# Patient Record
Sex: Female | Born: 1990 | Race: White | Hispanic: No | Marital: Married | Smoking: Never smoker
Health system: Southern US, Community
[De-identification: ages and names within clinical notes are randomized; demographics above are authoritative.]

## PROBLEM LIST (undated history)

## (undated) DIAGNOSIS — F329 Major depressive disorder, single episode, unspecified: Secondary | ICD-10-CM

## (undated) DIAGNOSIS — F32A Depression, unspecified: Secondary | ICD-10-CM

## (undated) DIAGNOSIS — C801 Malignant (primary) neoplasm, unspecified: Secondary | ICD-10-CM

## (undated) DIAGNOSIS — R221 Localized swelling, mass and lump, neck: Secondary | ICD-10-CM

## (undated) DIAGNOSIS — F419 Anxiety disorder, unspecified: Secondary | ICD-10-CM

## (undated) DIAGNOSIS — U071 COVID-19: Secondary | ICD-10-CM

## (undated) DIAGNOSIS — T7840XA Allergy, unspecified, initial encounter: Secondary | ICD-10-CM

## (undated) DIAGNOSIS — B369 Superficial mycosis, unspecified: Secondary | ICD-10-CM

## (undated) HISTORY — DX: COVID-19: U07.1

## (undated) HISTORY — DX: Allergy, unspecified, initial encounter: T78.40XA

## (undated) HISTORY — DX: Superficial mycosis, unspecified: B36.9

## (undated) HISTORY — PX: EYE SURGERY: SHX253

## (undated) HISTORY — PX: TOOTH EXTRACTION: SUR596

## (undated) HISTORY — PX: PORTA CATH INSERTION: CATH118285

## (undated) HISTORY — DX: Localized swelling, mass and lump, neck: R22.1

---

## 2013-07-26 ENCOUNTER — Other Ambulatory Visit: Payer: Self-pay | Admitting: Otolaryngology

## 2013-07-26 DIAGNOSIS — R590 Localized enlarged lymph nodes: Secondary | ICD-10-CM

## 2013-07-30 ENCOUNTER — Ambulatory Visit
Admission: RE | Admit: 2013-07-30 | Discharge: 2013-07-30 | Disposition: A | Discharge status: Home / Self Care | Payer: 59 | Source: Ambulatory Visit | Attending: Otolaryngology | Admitting: Otolaryngology

## 2013-07-30 DIAGNOSIS — R590 Localized enlarged lymph nodes: Secondary | ICD-10-CM

## 2013-07-30 MED ORDER — IOHEXOL 300 MG/ML  SOLN
75.0000 mL | Freq: Once | INTRAMUSCULAR | Status: AC | PRN
  Administered 2013-07-30: 75 mL via INTRAVENOUS

## 2013-08-04 ENCOUNTER — Encounter (HOSPITAL_COMMUNITY): Payer: Self-pay | Admitting: Pharmacist

## 2013-08-04 ENCOUNTER — Institutional Professional Consult (permissible substitution) (INDEPENDENT_AMBULATORY_CARE_PROVIDER_SITE_OTHER): Payer: 59 | Admitting: Thoracic Surgery (Cardiothoracic Vascular Surgery)

## 2013-08-04 ENCOUNTER — Other Ambulatory Visit: Payer: Self-pay | Admitting: *Deleted

## 2013-08-04 ENCOUNTER — Other Ambulatory Visit: Payer: Self-pay

## 2013-08-04 VITALS — BP 130/70 | HR 90 | Resp 20 | Ht 69.0 in | Wt 215.0 lb

## 2013-08-04 DIAGNOSIS — R590 Localized enlarged lymph nodes: Secondary | ICD-10-CM | POA: Insufficient documentation

## 2013-08-04 DIAGNOSIS — R599 Enlarged lymph nodes, unspecified: Secondary | ICD-10-CM

## 2013-08-04 NOTE — Progress Notes (Signed)
PCP is No primary provider on file. Referring Provider is Rosen, Jefry, MD  Chief Complaint  Patient presents with  . Adenopathy    Surgical eval, Chest CT 07/30/2013    HPI: 22 yo woman sent for consultation regarding a mediastinal mass  [name] is a 22 year old previously healthy woman who first noted an enlarged lymph node in her left axilla in June. She also was having occasional dizzy spells and some low grade fevers. She was treated with antibiotics. Her symptoms and axillary node continued to wax and wane. In October she noted the axillary node again as well as a "swollen gland" in her right neck. She was having fevers, chills and night sweats, and continue to have dizzy spells and headaches. Over the past couple of weeks she has had "difficulty breathing and swallowing." A CXR was done on 10/28 and showed an anterior mediastinal mass. This was followed by a CT of the chest on 11/7 which showed a large anterior mediastinal mass encasing the great vessels. She has lost about 15 pounds in the last 3 months. She is generally tired. She continues to work. Her ZUBROD score is 0.   Past Medical History  Diagnosis Date  . Mass in neck     right side     Past Surgical History  Procedure Laterality Date  . Tooth extraction      Family History  Problem Relation Age of Onset  . Alzheimer's disease    . Osteoarthritis Mother   . Heart disease    . Hearing loss    . Migraines Mother   . Parkinsonism    . Breast cancer    . Transient ischemic attack    . Autoimmune disease      Social History History  Substance Use Topics  . Smoking status: Never Smoker   . Smokeless tobacco: Never Used  . Alcohol Use: No    No current outpatient prescriptions on file.   No current facility-administered medications for this visit.    No Known Allergies  Review of Systems  Constitutional: Positive for fever, chills, diaphoresis (night sweats) and activity change.  Respiratory:   Difficulty breathing and swallowing over past couple of weeks  Genitourinary: Positive for frequency.  Skin:       Itching  Neurological: Positive for dizziness and headaches.  Psychiatric/Behavioral: The patient is nervous/anxious.   All other systems reviewed and are negative.    BP 130/70  Pulse 90  Resp 20  Ht 5' 9" (1.753 m)  Wt 215 lb (97.523 kg)  BMI 31.74 kg/m2  SpO2 99%  LMP 07/02/2013 Physical Exam  Vitals reviewed. Constitutional: She is oriented to person, place, and time. No distress.  Overweight  HENT:  Head: Normocephalic and atraumatic.  Eyes: EOM are normal. Pupils are equal, round, and reactive to light.  Neck: Neck supple. No tracheal deviation present. No thyromegaly present.  Cardiovascular: Normal rate and regular rhythm.  Exam reveals no gallop and no friction rub.   No murmur heard. Pulmonary/Chest: Effort normal and breath sounds normal. No stridor. She has no wheezes. She has no rales.  Abdominal: Soft. There is no tenderness.  Musculoskeletal: She exhibits no edema.  Lymphadenopathy:    She has cervical adenopathy (high right cervical adenopathy).  Neurological: She is alert and oriented to person, place, and time.  No focal deficit  Skin: Skin is warm and dry.     Diagnostic Tests:  CT CHEST WITH CONTRAST  TECHNIQUE:    Multidetector CT imaging of the chest was performed during  intravenous contrast administration.  CONTRAST: 75mL OMNIPAQUE IOHEXOL 300 MG/ML SOLN  COMPARISON: Chest radiographs dated 07/20/2013  FINDINGS:  Minimal/trace right pleural fluid. Minimal dependent atelectasis.  Lungs are otherwise clear. No pneumothorax.  Visualized thyroid is unremarkable.  Heart is normal in size. No pericardial effusion.  5.5 x 13.7 cm soft tissue mass in the anterior/superior mediastinum  (series 2/ image 20), suspicious for aggregate prevascular  lymphadenopathy.  Additional lymphadenopathy includes:  --1.6 cm short axis right  supraclavicular node (series 2/image 5)  --3.7 cm short axis right paratracheal node (series 2/ image 15)  --2.3 cm short axis left axillary node (series 2/ image 15)  --2.5 cm short axis left IMA node (series 2/ image 27)  Visualized upper abdomen is unremarkable. No visualized  lymphadenopathy.  Visualized osseous structures are within normal limits.  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  Additional thoracic lymphadenopathy, as described above.  Consider biopsy or surgical resection of left axillary nodes for  tissue confirmation. Additionally, PET-CT could be considered for  further staging.  Electronically Signed  By: Sriyesh Krishnan M.D.  On: 07/30/2013 10:10   Impression: 22 yo woman with an anterior mediastinal mass. Her symptoms of fever, night sweats, weight loss, etc are highly concerning for lymphoma.  I reviewed the CT with [name] and her father. We discussed the differential diagnosis including thymoma, germ cell tumors, thyroid and lymphoma. We discussed the need for a surgical biopsy to obtain adequate tissue for diagnosis. I recommended we proceed with a mediastinoscopy to obtain tissue from the mass. I described the operation to them including the incision and the need for general anesthesia. We discussed the indications, risks, benefits, and alternatives. They understand the risks include but are not limited to death(<1%), bleeding, infection, recurrent nerve injury causing hoarseness. They understand this is a diagnostic, and not a therapeutic, procedure. We will plan to do this as an outpatient. She accepts the risks and agrees to proceed.  Will have alpha fetoprotein and beta HCG done preop.  Plan mediastinoscopy in OR Monday 08/09/13 

## 2013-08-05 ENCOUNTER — Other Ambulatory Visit: Payer: Self-pay | Admitting: *Deleted

## 2013-08-05 ENCOUNTER — Other Ambulatory Visit: Payer: Self-pay

## 2013-08-05 DIAGNOSIS — R590 Localized enlarged lymph nodes: Secondary | ICD-10-CM

## 2013-08-05 DIAGNOSIS — R222 Localized swelling, mass and lump, trunk: Secondary | ICD-10-CM

## 2013-08-06 ENCOUNTER — Encounter (HOSPITAL_COMMUNITY): Payer: Self-pay

## 2013-08-06 ENCOUNTER — Encounter (HOSPITAL_COMMUNITY)
Admission: RE | Admit: 2013-08-06 | Discharge: 2013-08-06 | Disposition: A | Discharge status: Home / Self Care | Payer: 59 | Source: Ambulatory Visit | Attending: Thoracic Surgery (Cardiothoracic Vascular Surgery) | Admitting: Thoracic Surgery (Cardiothoracic Vascular Surgery)

## 2013-08-06 ENCOUNTER — Encounter: Payer: 59 | Admitting: Thoracic Surgery (Cardiothoracic Vascular Surgery)

## 2013-08-06 ENCOUNTER — Telehealth: Payer: Self-pay | Admitting: *Deleted

## 2013-08-06 ENCOUNTER — Other Ambulatory Visit: Payer: Self-pay | Admitting: *Deleted

## 2013-08-06 ENCOUNTER — Other Ambulatory Visit (HOSPITAL_COMMUNITY): Payer: Self-pay | Admitting: *Deleted

## 2013-08-06 VITALS — BP 131/79 | HR 104 | Temp 98.7°F | Resp 18 | Ht 69.0 in | Wt 218.0 lb

## 2013-08-06 DIAGNOSIS — R590 Localized enlarged lymph nodes: Secondary | ICD-10-CM

## 2013-08-06 HISTORY — DX: Major depressive disorder, single episode, unspecified: F32.9

## 2013-08-06 HISTORY — DX: Depression, unspecified: F32.A

## 2013-08-06 HISTORY — DX: Anxiety disorder, unspecified: F41.9

## 2013-08-06 LAB — SURGICAL PCR SCREEN: Staphylococcus aureus: POSITIVE — AB

## 2013-08-06 LAB — CBC
HCT: 35.8 % — ABNORMAL LOW (ref 36.0–46.0)
MCV: 73.8 fL — ABNORMAL LOW (ref 78.0–100.0)
RBC: 4.85 MIL/uL (ref 3.87–5.11)
RDW: 16.1 % — ABNORMAL HIGH (ref 11.5–15.5)
WBC: 9.5 10*3/uL (ref 4.0–10.5)

## 2013-08-06 LAB — COMPREHENSIVE METABOLIC PANEL
BUN: 9 mg/dL (ref 6–23)
CO2: 26 mEq/L (ref 19–32)
Chloride: 101 mEq/L (ref 96–112)
Creatinine, Ser: 0.78 mg/dL (ref 0.50–1.10)
GFR calc Af Amer: >90 mL/min (ref >90)
GFR calc non Af Amer: >90 mL/min (ref >90)
Glucose, Bld: 90 mg/dL (ref 70–99)
Potassium: 4.2 mEq/L (ref 3.5–5.1)
Total Bilirubin: 0.3 mg/dL (ref 0.3–1.2)

## 2013-08-06 LAB — PROTIME-INR
INR: 1.08 (ref 0.00–1.49)
Prothrombin Time: 13.8 seconds (ref 11.6–15.2)

## 2013-08-06 NOTE — Pre-Procedure Instructions (Signed)
Miranda Villegas  08/06/2013   Your procedure is scheduled on:  Monday, August 09, 2013 at 3:20 PM.   Report to [facility] Entrance "A" at 8:30 AM.   Call this number if you have problems the morning of surgery: [phone]   Remember:   Do not eat food or drink liquids after midnight Sunday, 08/08/13.   Take these medicines the morning of surgery with A SIP OF WATER: loratadine (CLARITIN) - if needed.    Do not wear jewelry, make-up or nail polish.  Do not wear lotions, powders, or perfumes. You may wear deodorant.  Do not shave 48 hours prior to surgery.   Do not bring valuables to the hospital.  [facility] is not responsible                  for any belongings or valuables.               Contacts, dentures or bridgework may not be worn into surgery.  Leave suitcase in the car. After surgery it may be brought to your room.  For patients admitted to the hospital, discharge time is determined by your                treatment team.               Patients discharged the day of surgery will not be allowed to drive  home.  Name and phone number of your driver: Family/friend   Special Instructions: Shower using CHG 2 nights before surgery and the night before surgery.  If you shower the day of surgery use CHG.  Use special wash - you have one bottle of CHG for all showers.  You should use approximately 1/3 of the bottle for each shower.   Please read over the following fact sheets that you were given: Pain Booklet, Coughing and Deep Breathing, Blood Transfusion Information, MRSA Information and Surgical Site Infection Prevention

## 2013-08-06 NOTE — Telephone Encounter (Signed)
Patients father called and asked if she can get to the hospital later since her surgery is second case.  I told him that she needs to arrive at [facility] Admissions b/t 10:30-11:00am for her afternoon procedure.

## 2013-08-06 NOTE — Progress Notes (Signed)
Pt's dad called Dr. Sunday Corn office after PAT appt to confirm the 8:30 AM arrival time. He states that the office told him that pt could come in at 11:00 AM on Monday, 08/09/13. Surgery scheduled for 3:20PM.

## 2013-08-08 LAB — AFP TUMOR MARKER: AFP-Tumor Marker: <1.3 ng/mL (ref 0.0–8.0)

## 2013-08-08 MED ORDER — DEXTROSE 5 % IV SOLN
1.5000 g | INTRAVENOUS | Status: AC
  Administered 2013-08-09: 1.5 g via INTRAVENOUS
  Filled 2013-08-08 (×2): qty 1.5

## 2013-08-09 ENCOUNTER — Ambulatory Visit (HOSPITAL_COMMUNITY): Payer: 59 | Admitting: Anesthesiology

## 2013-08-09 ENCOUNTER — Encounter (HOSPITAL_COMMUNITY)
Admission: RE | Disposition: A | Discharge status: Home / Self Care | Payer: Self-pay | Source: Ambulatory Visit | Attending: Thoracic Surgery (Cardiothoracic Vascular Surgery)

## 2013-08-09 ENCOUNTER — Ambulatory Visit (HOSPITAL_COMMUNITY)
Admission: RE | Admit: 2013-08-09 | Discharge: 2013-08-09 | Disposition: A | Discharge status: Home / Self Care | Payer: 59 | Source: Ambulatory Visit | Attending: Thoracic Surgery (Cardiothoracic Vascular Surgery) | Admitting: Thoracic Surgery (Cardiothoracic Vascular Surgery)

## 2013-08-09 ENCOUNTER — Encounter (HOSPITAL_COMMUNITY): Payer: 59 | Admitting: Anesthesiology

## 2013-08-09 ENCOUNTER — Encounter (HOSPITAL_COMMUNITY): Payer: Self-pay | Admitting: Anesthesiology

## 2013-08-09 DIAGNOSIS — Z01818 Encounter for other preprocedural examination: Secondary | ICD-10-CM | POA: Insufficient documentation

## 2013-08-09 DIAGNOSIS — Z01810 Encounter for preprocedural cardiovascular examination: Secondary | ICD-10-CM | POA: Insufficient documentation

## 2013-08-09 DIAGNOSIS — C8111 Nodular sclerosis classical Hodgkin lymphoma, lymph nodes of head, face, and neck: Secondary | ICD-10-CM | POA: Insufficient documentation

## 2013-08-09 DIAGNOSIS — R599 Enlarged lymph nodes, unspecified: Secondary | ICD-10-CM

## 2013-08-09 DIAGNOSIS — R590 Localized enlarged lymph nodes: Secondary | ICD-10-CM

## 2013-08-09 DIAGNOSIS — Z01812 Encounter for preprocedural laboratory examination: Secondary | ICD-10-CM | POA: Insufficient documentation

## 2013-08-09 HISTORY — PX: LYMPH NODE BIOPSY: SHX201

## 2013-08-09 HISTORY — PX: MEDIASTINOSCOPY: SHX5086

## 2013-08-09 SURGERY — MEDIASTINOSCOPY
Anesthesia: General

## 2013-08-09 MED ORDER — HYDROMORPHONE HCL PF 1 MG/ML IJ SOLN
0.2500 mg | INTRAMUSCULAR | Status: DC | PRN
  Administered 2013-08-09: 0.5 mg via INTRAVENOUS
  Administered 2013-08-09 (×2): 0.25 mg via INTRAVENOUS

## 2013-08-09 MED ORDER — PROPOFOL 10 MG/ML IV BOLUS
INTRAVENOUS | Status: DC | PRN
  Administered 2013-08-09: 200 mg via INTRAVENOUS

## 2013-08-09 MED ORDER — HEMOSTATIC AGENTS (NO CHARGE) OPTIME
TOPICAL | Status: DC | PRN
  Administered 2013-08-09: 1 via TOPICAL

## 2013-08-09 MED ORDER — GLYCOPYRROLATE 0.2 MG/ML IJ SOLN
INTRAMUSCULAR | Status: DC | PRN
  Administered 2013-08-09: 0.6 mg via INTRAVENOUS

## 2013-08-09 MED ORDER — SODIUM CHLORIDE 0.9 % IV SOLN: 250.0000 mL | INTRAVENOUS | Status: DC | PRN

## 2013-08-09 MED ORDER — OXYCODONE HCL 5 MG/5ML PO SOLN: 5.0000 mg | Freq: Once | ORAL | Status: DC | PRN

## 2013-08-09 MED ORDER — ACETAMINOPHEN 650 MG RE SUPP: 650.0000 mg | RECTAL | Status: DC | PRN

## 2013-08-09 MED ORDER — PROMETHAZINE HCL 25 MG/ML IJ SOLN: 6.2500 mg | INTRAMUSCULAR | Status: DC | PRN

## 2013-08-09 MED ORDER — HYDROMORPHONE HCL PF 1 MG/ML IJ SOLN
INTRAMUSCULAR | Status: AC
  Administered 2013-08-09: 0.5 mg via INTRAVENOUS
  Filled 2013-08-09: qty 1

## 2013-08-09 MED ORDER — OXYCODONE HCL 5 MG PO TABS: 5.0000 mg | ORAL_TABLET | Freq: Once | ORAL | Status: DC | PRN

## 2013-08-09 MED ORDER — OXYCODONE HCL 5 MG PO TABS: 5.0000 mg | ORAL_TABLET | Freq: Four times a day (QID) | ORAL | Status: DC | PRN

## 2013-08-09 MED ORDER — ONDANSETRON HCL 4 MG/2ML IJ SOLN: 4.0000 mg | Freq: Four times a day (QID) | INTRAMUSCULAR | Status: DC | PRN

## 2013-08-09 MED ORDER — LACTATED RINGERS IV SOLN
INTRAVENOUS | Status: DC | PRN
  Administered 2013-08-09 (×2): via INTRAVENOUS

## 2013-08-09 MED ORDER — ACETAMINOPHEN 325 MG PO TABS: 650.0000 mg | ORAL_TABLET | ORAL | Status: DC | PRN

## 2013-08-09 MED ORDER — ONDANSETRON HCL 4 MG/2ML IJ SOLN
INTRAMUSCULAR | Status: DC | PRN
  Administered 2013-08-09: 4 mg via INTRAVENOUS

## 2013-08-09 MED ORDER — SODIUM CHLORIDE 0.9 % IJ SOLN: 3.0000 mL | Freq: Two times a day (BID) | INTRAMUSCULAR | Status: DC

## 2013-08-09 MED ORDER — LIDOCAINE HCL (CARDIAC) 20 MG/ML IV SOLN
INTRAVENOUS | Status: DC | PRN
  Administered 2013-08-09: 50 mg via INTRAVENOUS

## 2013-08-09 MED ORDER — MIDAZOLAM HCL 5 MG/5ML IJ SOLN
INTRAMUSCULAR | Status: DC | PRN
  Administered 2013-08-09: 2 mg via INTRAVENOUS

## 2013-08-09 MED ORDER — FENTANYL CITRATE 0.05 MG/ML IJ SOLN
INTRAMUSCULAR | Status: DC | PRN
  Administered 2013-08-09 (×2): 100 ug via INTRAVENOUS
  Administered 2013-08-09: 50 ug via INTRAVENOUS

## 2013-08-09 MED ORDER — ROCURONIUM BROMIDE 100 MG/10ML IV SOLN
INTRAVENOUS | Status: DC | PRN
  Administered 2013-08-09: 40 mg via INTRAVENOUS

## 2013-08-09 MED ORDER — LACTATED RINGERS IV SOLN
INTRAVENOUS | Status: DC
  Administered 2013-08-09: 12:00:00 via INTRAVENOUS

## 2013-08-09 MED ORDER — SODIUM CHLORIDE 0.9 % IJ SOLN: 3.0000 mL | INTRAMUSCULAR | Status: DC | PRN

## 2013-08-09 MED ORDER — 0.9 % SODIUM CHLORIDE (POUR BTL) OPTIME
TOPICAL | Status: DC | PRN
  Administered 2013-08-09: 1000 mL

## 2013-08-09 MED ORDER — ARTIFICIAL TEARS OP OINT
TOPICAL_OINTMENT | OPHTHALMIC | Status: DC | PRN
  Administered 2013-08-09: 1 via OPHTHALMIC

## 2013-08-09 MED ORDER — DIPHENHYDRAMINE HCL 50 MG/ML IJ SOLN
INTRAMUSCULAR | Status: DC | PRN
  Administered 2013-08-09: 12.5 mg via INTRAVENOUS

## 2013-08-09 MED ORDER — NEOSTIGMINE METHYLSULFATE 1 MG/ML IJ SOLN
INTRAMUSCULAR | Status: DC | PRN
  Administered 2013-08-09: 4 mg via INTRAVENOUS

## 2013-08-09 SURGICAL SUPPLY — 43 items
APPLIER CLIP LOGIC TI 5 (MISCELLANEOUS) IMPLANT
BLADE SURG 15 STRL LF DISP TIS (BLADE) IMPLANT
BLADE SURG 15 STRL SS (BLADE)
CANISTER SUCTION 2500CC (MISCELLANEOUS) ×2 IMPLANT
CLIP TI MEDIUM 6 (CLIP) IMPLANT
CONT SPEC 4OZ CLIKSEAL STRL BL (MISCELLANEOUS) ×4 IMPLANT
COVER SURGICAL LIGHT HANDLE (MISCELLANEOUS) ×4 IMPLANT
DERMABOND ADVANCED (GAUZE/BANDAGES/DRESSINGS) ×1
DERMABOND ADVANCED .7 DNX12 (GAUZE/BANDAGES/DRESSINGS) ×1 IMPLANT
DRAPE CHEST BREAST 15X10 FENES (DRAPES) ×2 IMPLANT
ELECT REM PT RETURN 9FT ADLT (ELECTROSURGICAL) ×2
ELECTRODE REM PT RTRN 9FT ADLT (ELECTROSURGICAL) ×1 IMPLANT
GAUZE SPONGE 4X4 16PLY XRAY LF (GAUZE/BANDAGES/DRESSINGS) ×2 IMPLANT
GLOVE BIO SURGEON STRL SZ 6 (GLOVE) ×4 IMPLANT
GLOVE BIO SURGEON STRL SZ 6.5 (GLOVE) ×2 IMPLANT
GLOVE BIOGEL PI IND STRL 6.5 (GLOVE) ×1 IMPLANT
GLOVE BIOGEL PI INDICATOR 6.5 (GLOVE) ×1
GLOVE EUDERMIC 7 POWDERFREE (GLOVE) ×2 IMPLANT
GOWN PREVENTION PLUS XLARGE (GOWN DISPOSABLE) ×2 IMPLANT
GOWN STRL NON-REIN LRG LVL3 (GOWN DISPOSABLE) ×4 IMPLANT
HEMOSTAT SURGICEL 2X14 (HEMOSTASIS) ×2 IMPLANT
KIT BASIN OR (CUSTOM PROCEDURE TRAY) ×2 IMPLANT
KIT ROOM TURNOVER OR (KITS) ×2 IMPLANT
NS IRRIG 1000ML POUR BTL (IV SOLUTION) ×2 IMPLANT
PACK SURGICAL SETUP 50X90 (CUSTOM PROCEDURE TRAY) ×2 IMPLANT
PAD ARMBOARD 7.5X6 YLW CONV (MISCELLANEOUS) ×4 IMPLANT
PENCIL BUTTON HOLSTER BLD 10FT (ELECTRODE) ×2 IMPLANT
SPONGE GAUZE 4X4 12PLY (GAUZE/BANDAGES/DRESSINGS) ×2 IMPLANT
SPONGE INTESTINAL PEANUT (DISPOSABLE) IMPLANT
SUT SILK 2 0 TIES 10X30 (SUTURE) IMPLANT
SUT VIC AB 2-0 CT1 27 (SUTURE)
SUT VIC AB 2-0 CT1 TAPERPNT 27 (SUTURE) IMPLANT
SUT VIC AB 3-0 SH 18 (SUTURE) ×2 IMPLANT
SUT VIC AB 3-0 SH 27 (SUTURE) ×1
SUT VIC AB 3-0 SH 27X BRD (SUTURE) ×1 IMPLANT
SUT VICRYL 4-0 PS2 18IN ABS (SUTURE) ×2 IMPLANT
SWAB COLLECTION DEVICE MRSA (MISCELLANEOUS) IMPLANT
SYRINGE 10CC LL (SYRINGE) ×2 IMPLANT
TOWEL OR 17X24 6PK STRL BLUE (TOWEL DISPOSABLE) ×2 IMPLANT
TOWEL OR 17X26 10 PK STRL BLUE (TOWEL DISPOSABLE) ×2 IMPLANT
TUBE ANAEROBIC SPECIMEN COL (MISCELLANEOUS) IMPLANT
TUBE CONNECTING 12X1/4 (SUCTIONS) ×2 IMPLANT
WATER STERILE IRR 1000ML POUR (IV SOLUTION) ×2 IMPLANT

## 2013-08-09 NOTE — Brief Op Note (Signed)
08/09/2013  6:18 PM  PATIENT:  Miranda Villegas  22 y.o. female  PRE-OPERATIVE DIAGNOSIS:  MEDIASTINAL ADENOPATHY  POST-OPERATIVE DIAGNOSIS:  MEDIASTINAL ADENOPATHY  PROCEDURE:  Procedure(s): MEDIASTINOSCOPY (N/A)  SURGEON:  Surgeon(s) and Role:    * Loreli Slot, MD - Primary   ANESTHESIA:   general  EBL:  Total I/O In: 1700 [I.V.:1700] Out: -   BLOOD ADMINISTERED:none  DRAINS: none   LOCAL MEDICATIONS USED:  NONE  SPECIMEN:  Source of Specimen:  anteriro mediastinal mass  DISPOSITION OF SPECIMEN:  PATHOLOGY  COUNTS:  YES  PLAN OF CARE: Discharge to home after PACU  PATIENT DISPOSITION:  PACU - hemodynamically stable.   Delay start of Pharmacological VTE agent (>24hrs) due to surgical blood loss or risk of bleeding: not applicable  Frozen: lymphoid tissue with intense inflammatory reaction- favor Hodgkin's lymphoma

## 2013-08-09 NOTE — Anesthesia Procedure Notes (Signed)
Procedure Name: Intubation Date/Time: 08/09/2013 5:14 PM Performed by: Brien Mates D Pre-anesthesia Checklist: Patient identified, Emergency Drugs available, Suction available, Patient being monitored and Timeout performed Patient Re-evaluated:Patient Re-evaluated prior to inductionOxygen Delivery Method: Circle system utilized Preoxygenation: Pre-oxygenation with 100% oxygen Intubation Type: IV induction Ventilation: Mask ventilation without difficulty and Oral airway inserted - appropriate to patient size Laryngoscope Size: Miller and 2 Grade View: Grade I Tube type: Oral Tube size: 7.5 mm Number of attempts: 1 Airway Equipment and Method: Stylet Placement Confirmation: ETT inserted through vocal cords under direct vision,  positive ETCO2 and breath sounds checked- equal and bilateral Secured at: 22 cm Tube secured with: Tape Dental Injury: Teeth and Oropharynx as per pre-operative assessment

## 2013-08-09 NOTE — Interval H&P Note (Signed)
History and Physical Interval Note:  08/09/2013 4:22 PM  Miranda Villegas  has presented today for surgery, with the diagnosis of MEDIASTINAL ADENOPATHY  The various methods of treatment have been discussed with the patient and family. After consideration of risks, benefits and other options for treatment, the patient has consented to  Procedure(s): MEDIASTINOSCOPY (N/A) as a surgical intervention .  The patient's history has been reviewed, patient examined, no change in status, stable for surgery.  I have reviewed the patient's chart and labs.  Questions were answered to the patient's satisfaction.     Ronniesha Seibold C

## 2013-08-09 NOTE — H&P (View-Only) (Signed)
PCP is No primary provider on file. Referring Provider is Miranda Colonel, MD  Chief Complaint  Patient presents with  . Adenopathy    Surgical eval, Chest CT 07/30/2013    HPI: 22 yo woman sent for consultation regarding a mediastinal mass  [name] is a 22 year old previously healthy woman who first noted an enlarged lymph node in her left axilla in June. She also was having occasional dizzy spells and some low grade fevers. She was treated with antibiotics. Her symptoms and axillary node continued to wax and wane. In October she noted the axillary node again as well as a "swollen gland" in her right neck. She was having fevers, chills and night sweats, and continue to have dizzy spells and headaches. Over the past couple of weeks she has had "difficulty breathing and swallowing." A CXR was done on 10/28 and showed an anterior mediastinal mass. This was followed by a CT of the chest on 11/7 which showed a large anterior mediastinal mass encasing the great vessels. She has lost about 15 pounds in the last 3 months. She is generally tired. She continues to work. Her ZUBROD score is 0.   Past Medical History  Diagnosis Date  . Mass in neck     right side     Past Surgical History  Procedure Laterality Date  . Tooth extraction      Family History  Problem Relation Age of Onset  . Alzheimer's disease    . Osteoarthritis Mother   . Heart disease    . Hearing loss    . Migraines Mother   . Parkinsonism    . Breast cancer    . Transient ischemic attack    . Autoimmune disease      Social History History  Substance Use Topics  . Smoking status: Never Smoker   . Smokeless tobacco: Never Used  . Alcohol Use: No    No current outpatient prescriptions on file.   No current facility-administered medications for this visit.    No Known Allergies  Review of Systems  Constitutional: Positive for fever, chills, diaphoresis (night sweats) and activity change.  Respiratory:   Difficulty breathing and swallowing over past couple of weeks  Genitourinary: Positive for frequency.  Skin:       Itching  Neurological: Positive for dizziness and headaches.  Psychiatric/Behavioral: The patient is nervous/anxious.   All other systems reviewed and are negative.    BP 130/70  Pulse 90  Resp 20  Ht 5\' 9"  (1.753 m)  Wt 215 lb (97.523 kg)  BMI 31.74 kg/m2  SpO2 99%  LMP 07/02/2013 Physical Exam  Vitals reviewed. Constitutional: She is oriented to person, place, and time. No distress.  Overweight  HENT:  Head: Normocephalic and atraumatic.  Eyes: EOM are normal. Pupils are equal, round, and reactive to light.  Neck: Neck supple. No tracheal deviation present. No thyromegaly present.  Cardiovascular: Normal rate and regular rhythm.  Exam reveals no gallop and no friction rub.   No murmur heard. Pulmonary/Chest: Effort normal and breath sounds normal. No stridor. She has no wheezes. She has no rales.  Abdominal: Soft. There is no tenderness.  Musculoskeletal: She exhibits no edema.  Lymphadenopathy:    She has cervical adenopathy (high right cervical adenopathy).  Neurological: She is alert and oriented to person, place, and time.  No focal deficit  Skin: Skin is warm and dry.     Diagnostic Tests:  CT CHEST WITH CONTRAST  TECHNIQUE:  Multidetector CT imaging of the chest was performed during  intravenous contrast administration.  CONTRAST: 75mL OMNIPAQUE IOHEXOL 300 MG/ML SOLN  COMPARISON: Chest radiographs dated 07/20/2013  FINDINGS:  Minimal/trace right pleural fluid. Minimal dependent atelectasis.  Lungs are otherwise clear. No pneumothorax.  Visualized thyroid is unremarkable.  Heart is normal in size. No pericardial effusion.  5.5 x 13.7 cm soft tissue mass in the anterior/superior mediastinum  (series 2/ image 20), suspicious for aggregate prevascular  lymphadenopathy.  Additional lymphadenopathy includes:  --1.6 cm short axis right  supraclavicular node (series 2/image 5)  --3.7 cm short axis right paratracheal node (series 2/ image 15)  --2.3 cm short axis left axillary node (series 2/ image 15)  --2.5 cm short axis left IMA node (series 2/ image 27)  Visualized upper abdomen is unremarkable. No visualized  lymphadenopathy.  Visualized osseous structures are within normal limits.  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  Additional thoracic lymphadenopathy, as described above.  Consider biopsy or surgical resection of left axillary nodes for  tissue confirmation. Additionally, PET-CT could be considered for  further staging.  Electronically Signed  By: Charline Bills M.D.  On: 07/30/2013 10:10   Impression: 22 yo woman with an anterior mediastinal mass. Her symptoms of fever, night sweats, weight loss, etc are highly concerning for lymphoma.  I reviewed the CT with [name] and her father. We discussed the differential diagnosis including thymoma, germ cell tumors, thyroid and lymphoma. We discussed the need for a surgical biopsy to obtain adequate tissue for diagnosis. I recommended we proceed with a mediastinoscopy to obtain tissue from the mass. I described the operation to them including the incision and the need for general anesthesia. We discussed the indications, risks, benefits, and alternatives. They understand the risks include but are not limited to death(<1%), bleeding, infection, recurrent nerve injury causing hoarseness. They understand this is a diagnostic, and not a therapeutic, procedure. We will plan to do this as an outpatient. She accepts the risks and agrees to proceed.  Will have alpha fetoprotein and beta HCG done preop.  Plan mediastinoscopy in OR Monday 08/09/13

## 2013-08-09 NOTE — Anesthesia Preprocedure Evaluation (Addendum)
Anesthesia Evaluation  Patient identified by MRN, date of birth, ID band Patient awake    Reviewed: Allergy & Precautions, H&P , NPO status , Patient's Chart, lab work & pertinent test results  History of Anesthesia Complications Negative for: history of anesthetic complications  Airway Mallampati: I TM Distance: >3 FB Neck ROM: Full    Dental  (+) Teeth Intact and Dental Advisory Given   Pulmonary neg pulmonary ROS,  breath sounds clear to auscultation        Cardiovascular negative cardio ROS  Rhythm:Regular Rate:Normal     Neuro/Psych negative neurological ROS     GI/Hepatic negative GI ROS, Neg liver ROS,   Endo/Other    Renal/GU negative Renal ROS  negative genitourinary   Musculoskeletal   Abdominal Normal abdominal exam  (+)   Peds  Hematology   Anesthesia Other Findings   Reproductive/Obstetrics                          Anesthesia Physical Anesthesia Plan  ASA: I  Anesthesia Plan: General   Post-op Pain Management:    Induction: Intravenous  Airway Management Planned: Oral ETT  Additional Equipment:   Intra-op Plan:   Post-operative Plan: Extubation in OR  Informed Consent: I have reviewed the patients History and Physical, chart, labs and discussed the procedure including the risks, benefits and alternatives for the proposed anesthesia with the patient or authorized representative who has indicated his/her understanding and acceptance.   Dental advisory given  Plan Discussed with: CRNA, Anesthesiologist and Surgeon  Anesthesia Plan Comments:         Anesthesia Quick Evaluation

## 2013-08-09 NOTE — Transfer of Care (Signed)
Immediate Anesthesia Transfer of Care Note  Patient: Miranda Villegas  Procedure(s) Performed: Procedure(s): MEDIASTINOSCOPY (N/A)  Patient Location: PACU  Anesthesia Type:General  Level of Consciousness: sedated  Airway & Oxygen Therapy: Patient Spontanous Breathing and Patient connected to face mask oxygen  Post-op Assessment: Report given to PACU RN and Post -op Vital signs reviewed and stable  Post vital signs: Reviewed and stable  Complications: No apparent anesthesia complications

## 2013-08-09 NOTE — Anesthesia Postprocedure Evaluation (Signed)
  Anesthesia Post-op Note  Patient: Miranda Villegas  Procedure(s) Performed: Procedure(s): MEDIASTINOSCOPY (N/A)  Patient Location: PACU  Anesthesia Type:General  Level of Consciousness: awake and alert   Airway and Oxygen Therapy: Patient Spontanous Breathing  Post-op Pain: mild  Post-op Assessment: Post-op Vital signs reviewed, Patient's Cardiovascular Status Stable, Respiratory Function Stable, Patent Airway, No signs of Nausea or vomiting and Pain level controlled  Post-op Vital Signs: Reviewed and stable  Complications: No apparent anesthesia complications

## 2013-08-09 NOTE — Preoperative (Signed)
Beta Blockers   Reason not to administer Beta Blockers:Not Applicable 

## 2013-08-10 NOTE — Op Note (Signed)
NAMEMAYELI, Miranda Villegas NO.:  [id number]  MEDICAL RECORD NO.:  [id number]  LOCATION:  [facility]                         FACILITY:  [facility]  PHYSICIAN:  Salvatore Decent. Dorris Fetch, M.D.DATE OF BIRTH:  Feb 12, 1991  DATE OF PROCEDURE: DATE OF DISCHARGE:  08/09/2013                              OPERATIVE REPORT   PREOPERATIVE DIAGNOSIS:  Anterior mediastinal mass.  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  PROCEDURE:  Mediastinoscopy.  SURGEON:  Salvatore Decent. Dorris Fetch, M.D.  ANESTHESIA:  General.  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  CLINICAL NOTE:  [name] is a 22 year old woman who recently has been having fevers and night sweats and feeling poorly.  During her workup, she was found to have an anterior mediastinal mass and was sent for biopsy for diagnostic purposes.  The patient was advised to undergo mediastinoscopy.  The indications, risks, benefits, and alternatives were discussed in detail with the patient.  She understood and accepted the risks and agreed to proceed.  OPERATIVE NOTE:  [name] was brought to the preoperative holding area on August 09, 2013. Anesthesia established intravenous access. She was given antibiotics.  She was taken to the operating room, anesthetized and intubated.  The neck and chest were prepped and draped in the usual sterile fashion.  A transverse incision was made 1 fingerbreadth above the sternal notch.  It was carried through the skin and subcutaneous tissue.  Hemostasis was achieved with electrocautery. The dissection was carried down to the pretracheal fascia.  There was an unusual appearing cervical node between the strap muscles, which was taken and sent for permanent pathology.  A finger was placed in along the Trachea to palpate  the anterior mediastinal mass, which was densely adherent to the trachea. The mediastinoscope was used to obtain samples, but it was never inserted deep into the chest on the pretracheal plane.    Multiple biopsies were taken and sent for frozen section.  While awaiting that result, additional biopsies were taken.  There was an intense inflammatory reaction associated with the mass.  There was nodal tissue present within some of the fibrotic tissue.  Findings on frozen section revealed probable lymphoma, favor Hodgkin's, but will have to await further confirmatory tests before a definitive diagnosis can be made. The mediastinoscope was reinserted and an inspection was made for hemostasis.  The mediastinoscope was withdrawn.  The remainder of the specimen was sent for permanent sections only.  The incision was closed in 2 layers with a 3-0 Vicryl closure of the platysma and a 4-0 Vicryl subcuticular suture.  All sponge, needle, and instrument counts were correct at the end of the procedure.  The patient was taken from the operating room to the postanesthetic care unit in good condition.     Salvatore Decent Dorris Fetch, M.D.     SCH/MEDQ  D:  08/09/2013  T:  08/10/2013  Job:  [id number]

## 2013-08-12 ENCOUNTER — Other Ambulatory Visit: Payer: Self-pay | Admitting: Internal Medicine

## 2013-08-12 ENCOUNTER — Inpatient Hospital Stay (HOSPITAL_COMMUNITY)
Admission: EM | Admit: 2013-08-12 | Discharge: 2013-08-15 | DRG: 842 | Disposition: A | Discharge status: Home Health | Payer: 59 | Attending: Internal Medicine | Admitting: Internal Medicine

## 2013-08-12 ENCOUNTER — Emergency Department (HOSPITAL_COMMUNITY): Payer: 59

## 2013-08-12 ENCOUNTER — Telehealth: Payer: Self-pay | Admitting: *Deleted

## 2013-08-12 ENCOUNTER — Encounter (HOSPITAL_COMMUNITY): Payer: Self-pay | Admitting: Emergency Medicine

## 2013-08-12 DIAGNOSIS — Z803 Family history of malignant neoplasm of breast: Secondary | ICD-10-CM

## 2013-08-12 DIAGNOSIS — C8110 Nodular sclerosis classical Hodgkin lymphoma, unspecified site: Secondary | ICD-10-CM

## 2013-08-12 DIAGNOSIS — F411 Generalized anxiety disorder: Secondary | ICD-10-CM | POA: Diagnosis present

## 2013-08-12 DIAGNOSIS — D649 Anemia, unspecified: Secondary | ICD-10-CM | POA: Diagnosis present

## 2013-08-12 DIAGNOSIS — R61 Generalized hyperhidrosis: Secondary | ICD-10-CM | POA: Diagnosis present

## 2013-08-12 DIAGNOSIS — R110 Nausea: Secondary | ICD-10-CM | POA: Diagnosis present

## 2013-08-12 DIAGNOSIS — R0600 Dyspnea, unspecified: Secondary | ICD-10-CM

## 2013-08-12 DIAGNOSIS — R0989 Other specified symptoms and signs involving the circulatory and respiratory systems: Secondary | ICD-10-CM | POA: Diagnosis present

## 2013-08-12 DIAGNOSIS — Z5111 Encounter for antineoplastic chemotherapy: Secondary | ICD-10-CM

## 2013-08-12 DIAGNOSIS — R634 Abnormal weight loss: Secondary | ICD-10-CM | POA: Diagnosis present

## 2013-08-12 DIAGNOSIS — Z8571 Personal history of Hodgkin lymphoma: Secondary | ICD-10-CM | POA: Insufficient documentation

## 2013-08-12 DIAGNOSIS — C8118 Nodular sclerosis classical Hodgkin lymphoma, lymph nodes of multiple sites: Principal | ICD-10-CM | POA: Diagnosis present

## 2013-08-12 DIAGNOSIS — C801 Malignant (primary) neoplasm, unspecified: Secondary | ICD-10-CM

## 2013-08-12 DIAGNOSIS — R599 Enlarged lymph nodes, unspecified: Secondary | ICD-10-CM

## 2013-08-12 DIAGNOSIS — D509 Iron deficiency anemia, unspecified: Secondary | ICD-10-CM

## 2013-08-12 DIAGNOSIS — R590 Localized enlarged lymph nodes: Secondary | ICD-10-CM

## 2013-08-12 DIAGNOSIS — R591 Generalized enlarged lymph nodes: Secondary | ICD-10-CM

## 2013-08-12 DIAGNOSIS — C8190 Hodgkin lymphoma, unspecified, unspecified site: Secondary | ICD-10-CM

## 2013-08-12 DIAGNOSIS — C8191 Hodgkin lymphoma, unspecified, lymph nodes of head, face, and neck: Secondary | ICD-10-CM

## 2013-08-12 DIAGNOSIS — R0609 Other forms of dyspnea: Secondary | ICD-10-CM | POA: Diagnosis present

## 2013-08-12 DIAGNOSIS — Z8249 Family history of ischemic heart disease and other diseases of the circulatory system: Secondary | ICD-10-CM

## 2013-08-12 DIAGNOSIS — R51 Headache: Secondary | ICD-10-CM | POA: Diagnosis present

## 2013-08-12 DIAGNOSIS — Z22322 Carrier or suspected carrier of Methicillin resistant Staphylococcus aureus: Secondary | ICD-10-CM

## 2013-08-12 HISTORY — DX: Malignant (primary) neoplasm, unspecified: C80.1

## 2013-08-12 LAB — CBC WITH DIFFERENTIAL/PLATELET
Basophils Absolute: 0 10*3/uL (ref 0.0–0.1)
Basophils Relative: 0 % (ref 0–1)
Eosinophils Absolute: 0.1 10*3/uL (ref 0.0–0.7)
Eosinophils Relative: 1 % (ref 0–5)
HCT: 29.7 % — ABNORMAL LOW (ref 36.0–46.0)
Hemoglobin: 9.1 g/dL — ABNORMAL LOW (ref 12.0–15.0)
Lymphocytes Relative: 6 % — ABNORMAL LOW (ref 12–46)
Lymphs Abs: 0.6 10*3/uL — ABNORMAL LOW (ref 0.7–4.0)
MCV: 74.1 fL — ABNORMAL LOW (ref 78.0–100.0)
Monocytes Relative: 7 % (ref 3–12)
Neutro Abs: 8.1 10*3/uL — ABNORMAL HIGH (ref 1.7–7.7)
RDW: 15.6 % — ABNORMAL HIGH (ref 11.5–15.5)
WBC: 9.5 10*3/uL (ref 4.0–10.5)

## 2013-08-12 LAB — BASIC METABOLIC PANEL
BUN: 10 mg/dL (ref 6–23)
CO2: 27 mEq/L (ref 19–32)
Chloride: 101 mEq/L (ref 96–112)
Creatinine, Ser: 0.72 mg/dL (ref 0.50–1.10)
GFR calc non Af Amer: >90 mL/min (ref >90)
Glucose, Bld: 88 mg/dL (ref 70–99)
Potassium: 4 mEq/L (ref 3.5–5.1)

## 2013-08-12 LAB — FERRITIN: Ferritin: 138 ng/mL (ref 10–291)

## 2013-08-12 LAB — IRON AND TIBC: Iron: <10 ug/dL — ABNORMAL LOW (ref 42–135)

## 2013-08-12 LAB — FOLATE: Folate: 10.4 ng/mL

## 2013-08-12 LAB — RETICULOCYTES: Retic Ct Pct: 1 % (ref 0.4–3.1)

## 2013-08-12 MED ORDER — DEXAMETHASONE SODIUM PHOSPHATE 4 MG/ML IJ SOLN: 4.0000 mg | Freq: Four times a day (QID) | INTRAMUSCULAR | Status: DC

## 2013-08-12 MED ORDER — ONDANSETRON HCL 4 MG/2ML IJ SOLN
4.0000 mg | Freq: Four times a day (QID) | INTRAMUSCULAR | Status: DC | PRN
  Administered 2013-08-14 – 2013-08-15 (×2): 4 mg via INTRAVENOUS
  Filled 2013-08-12 (×2): qty 2

## 2013-08-12 MED ORDER — ENOXAPARIN SODIUM 40 MG/0.4ML ~~LOC~~ SOLN
40.0000 mg | SUBCUTANEOUS | Status: DC
  Administered 2013-08-12: 40 mg via SUBCUTANEOUS
  Filled 2013-08-12 (×2): qty 0.4

## 2013-08-12 MED ORDER — ACETAMINOPHEN 325 MG PO TABS: 650.0000 mg | ORAL_TABLET | Freq: Four times a day (QID) | ORAL | Status: DC | PRN

## 2013-08-12 MED ORDER — IOHEXOL 300 MG/ML  SOLN
80.0000 mL | Freq: Once | INTRAMUSCULAR | Status: AC | PRN
  Administered 2013-08-12: 80 mL via INTRAVENOUS

## 2013-08-12 MED ORDER — DEXAMETHASONE SODIUM PHOSPHATE 10 MG/ML IJ SOLN
10.0000 mg | Freq: Once | INTRAMUSCULAR | Status: AC
  Administered 2013-08-12: 10 mg via INTRAVENOUS
  Filled 2013-08-12: qty 1

## 2013-08-12 MED ORDER — HYDROMORPHONE HCL PF 1 MG/ML IJ SOLN: 1.0000 mg | INTRAMUSCULAR | Status: AC | PRN

## 2013-08-12 MED ORDER — SODIUM CHLORIDE 0.9 % IV SOLN
INTRAVENOUS | Status: DC
  Administered 2013-08-12 – 2013-08-13 (×2): via INTRAVENOUS

## 2013-08-12 MED ORDER — SENNOSIDES-DOCUSATE SODIUM 8.6-50 MG PO TABS: 1.0000 | ORAL_TABLET | Freq: Every evening | ORAL | Status: DC | PRN

## 2013-08-12 MED ORDER — ONDANSETRON HCL 4 MG PO TABS: 4.0000 mg | ORAL_TABLET | Freq: Four times a day (QID) | ORAL | Status: DC | PRN

## 2013-08-12 MED ORDER — ACETAMINOPHEN 650 MG RE SUPP: 650.0000 mg | Freq: Four times a day (QID) | RECTAL | Status: DC | PRN

## 2013-08-12 MED ORDER — ONDANSETRON HCL 4 MG/2ML IJ SOLN
4.0000 mg | Freq: Once | INTRAMUSCULAR | Status: AC
  Administered 2013-08-12: 4 mg via INTRAVENOUS
  Filled 2013-08-12: qty 2

## 2013-08-12 MED ORDER — MORPHINE SULFATE 2 MG/ML IJ SOLN
0.5000 mg | INTRAMUSCULAR | Status: DC | PRN
  Administered 2013-08-13 – 2013-08-14 (×2): 0.5 mg via INTRAVENOUS
  Filled 2013-08-12 (×2): qty 1

## 2013-08-12 MED ORDER — HYDROMORPHONE HCL PF 1 MG/ML IJ SOLN
1.0000 mg | Freq: Once | INTRAMUSCULAR | Status: AC
  Administered 2013-08-12: 1 mg via INTRAVENOUS
  Filled 2013-08-12: qty 1

## 2013-08-12 MED ORDER — DEXAMETHASONE SODIUM PHOSPHATE 4 MG/ML IJ SOLN
4.0000 mg | INTRAMUSCULAR | Status: DC
  Administered 2013-08-12 – 2013-08-13 (×5): 4 mg via INTRAVENOUS
  Filled 2013-08-12 (×11): qty 1

## 2013-08-12 NOTE — Consult Note (Signed)
Reason for Consult:Dyspnea Referring Physician: Hospitalist  Miranda Villegas is an 22 y.o. female.  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  She has also had cervical adenopathy that had improved for a time but swelled again since the procedure with pain.  This morning, she had difficulty breathing with voice change and some degree of stridor.  She was sent to the ER where she was treated with racemic epinephrine and steroids.  She has been admitted for observation and reports a return to normal breathing and less pain in the neck nodes.  Past Medical History  Diagnosis Date  . Mass in neck     right side   . Anxiety   . Depression     as a 22 year old  . Cancer 08/12/13    lymphoma recently dx    Past Surgical History  Procedure Laterality Date  . Tooth extraction    . Lymph node biopsy  08/09/13    medial neck area - lymphoma dx.    Family History  Problem Relation Age of Onset  . Alzheimer's disease    . Osteoarthritis Mother   . Heart disease    . Hearing loss    . Migraines Mother   . Parkinsonism    . Breast cancer    . Transient ischemic attack    . Autoimmune disease      Social History:  reports that she has never smoked. She has never used smokeless tobacco. She reports that she does not drink alcohol or use illicit drugs.  Allergies: No Known Allergies  Medications: I have reviewed the patient's current medications.  Results for orders placed during the hospital encounter of 08/12/13 (from the past 48 hour(s))  CBC WITH DIFFERENTIAL     Status: Abnormal   Collection Time    08/12/13 12:55 PM      Result Value Range   WBC 9.5  4.0 - 10.5 K/uL   RBC 4.01  3.87 - 5.11 MIL/uL   Hemoglobin 9.1 (*) 12.0 - 15.0 g/dL   HCT 14.7 (*) 82.9 - 56.2 %   MCV 74.1 (*) 78.0 - 100.0 fL   MCH 22.7 (*) 26.0 - 34.0 pg   MCHC 30.6  30.0 - 36.0 g/dL   RDW 13.0 (*) 86.5 - 78.4 %    Platelets 376  150 - 400 K/uL   Neutrophils Relative % 86 (*) 43 - 77 %   Lymphocytes Relative 6 (*) 12 - 46 %   Monocytes Relative 7  3 - 12 %   Eosinophils Relative 1  0 - 5 %   Basophils Relative 0  0 - 1 %   Neutro Abs 8.1 (*) 1.7 - 7.7 K/uL   Lymphs Abs 0.6 (*) 0.7 - 4.0 K/uL   Monocytes Absolute 0.7  0.1 - 1.0 K/uL   Eosinophils Absolute 0.1  0.0 - 0.7 K/uL   Basophils Absolute 0.0  0.0 - 0.1 K/uL   Smear Review MORPHOLOGY UNREMARKABLE    BASIC METABOLIC PANEL     Status: None   Collection Time    08/12/13 12:55 PM      Result Value Range   Sodium 137  135 - 145 mEq/L   Potassium 4.0  3.5 - 5.1 mEq/L   Chloride 101  96 - 112 mEq/L   CO2 27  19 - 32 mEq/L   Glucose, Bld 88  70 - 99 mg/dL  BUN 10  6 - 23 mg/dL   Creatinine, Ser 4.09  0.50 - 1.10 mg/dL   Calcium 9.4  8.4 - 81.1 mg/dL   GFR calc non Af Amer >90  >90 mL/min   GFR calc Af Amer >90  >90 mL/min   Comment: (NOTE)     The eGFR has been calculated using the CKD EPI equation.     This calculation has not been validated in all clinical situations.     eGFR's persistently <90 mL/min signify possible Chronic Kidney     Disease.  RETICULOCYTES     Status: None   Collection Time    08/12/13 12:55 PM      Result Value Range   Retic Ct Pct 1.0  0.4 - 3.1 %   RBC. 4.13  3.87 - 5.11 MIL/uL   Retic Count, Manual 41.3  19.0 - 186.0 K/uL    Dg Chest 2 View  08/12/2013   CLINICAL DATA:  Mediastinal mass biopsy 3 days ago. Short of breath and increased swelling of neck  EXAM: CHEST  2 VIEW  COMPARISON:  08/06/2013  FINDINGS: Large anterior mediastinal lymph node mass is unchanged allowing for AP projection.  Negative for infiltrate effusion. Negative for pneumothorax. If there is concern about SVC syndrome, consider CT chest with contrast.  IMPRESSION: Large anterior mediastinal mass is unchanged. No change from the prior chest x-ray.   Electronically Signed   By: Marlan Palau M.D.   On: 08/12/2013 13:42   Ct Soft Tissue  Neck W Contrast  08/12/2013   CLINICAL DATA:  Lymphoma.  Recent biopsy.  Difficulty breathing.  EXAM: CT NECK WITH CONTRAST  TECHNIQUE: Multidetector CT imaging of the neck was performed using the standard protocol following the bolus administration of intravenous contrast.  CONTRAST:  80mL OMNIPAQUE IOHEXOL 300 MG/ML  SOLN  COMPARISON:  CT chest 07/30/2013.  FINDINGS: Extensive inflammatory reaction accompanies widespread right greater than left cervical lymphadenopathy. This is maximal in right level II and II with conglomerate mass on the right level II station measuring 28 x 32 mm. Edema of the right greater than left vallecula, aryepiglottic fold, and vocal cords significantly narrows the airway at the level of the glottis. There is slight medialization of the right true vocal cord. Right recurrent laryngeal nerve palsy not excluded due to severe adenopathy.  Moderate mass effect on the right internal jugular without obstruction. Extensive mediastinal adenopathy persists. Normal-appearing thyroid. Negative intracranial compartment. No osseous findings.  IMPRESSION: Extensive inflammatory reaction accompanying widespread right greater than left cervical adenopathy. Hodgkin's lymphoma is favored. Final pathology from previous mediastinal biopsy not yet available.  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 Slight medialization right true vocal cord could indicate recurrent laryngeal nerve palsy. ENT consultation for airway assessment may be warranted.   Electronically Signed   By: Davonna Belling M.D.   On: 08/12/2013 14:02    Review of Systems  Musculoskeletal: Positive for neck pain.  All other systems reviewed and are negative.   Blood pressure 100/53, pulse 99, temperature 99 F (37.2 C), temperature source Oral, resp. rate 27, last menstrual period 08/05/2013, SpO2 98.00%. Physical Exam   Constitutional: She is oriented to person, place, and time. She appears well-developed and well-nourished. No distress.  HENT:  Head: Normocephalic and atraumatic.  Right Ear: External ear normal.  Left Ear: External ear normal.  Nose: Nose normal.  Mouth/Throat: Oropharynx  is clear and moist.  Normal voice.  Eyes: Conjunctivae and EOM are normal. Pupils are equal, round, and reactive to light.  Neck: Normal range of motion. Neck supple.  Bulky right zone 2 and 3 tender adenopathy.  Solitary enlarged left zone 4 node, also tender. Inferior central neck incision healing.  Cardiovascular: Normal rate.   Respiratory: Effort normal.  No stridor.  GI:  Did not examine.  Genitourinary:  Did not examine.  Musculoskeletal: Normal range of motion.  Neurological: She is alert and oriented to person, place, and time. No cranial nerve deficit.  Skin: Skin is warm and dry.  Psychiatric: She has a normal mood and affect. Her behavior is normal. Judgment and thought content normal.    Assessment/Plan: Hodgkin's lymphoma, cervical and mediastinal involvement, dyspnea with swelling in right larynx and pharynx on CT. I personally reviewed her neck CT showing bulky adenopathy of the right greater than left neck with associated inflammatory changes, large mass in superior mediastinum, and edema of the right supraglottic larynx, lateral pharynx, and vallecula. More than likely, her breathing trouble was due to edema in the airway related to inflammatory changes associated with the lymphoma and perhaps aggravated by the mediastinoscopy.  Symptoms have responded quite well to steroid therapy.  I do not feel that fiberoptic laryngeal exam is needed at this point given full resolution of symptoms.  If symptoms recur, laryngoscopy could be helpful to evaluate further.  Otherwise, I agree with continuing steroid therapy as an inpatient and perhaps discharging on an oral steroid taper.  She is due to be seen by  oncology tomorrow and they may have specific input regarding steroid therapy in light of their plans for treatment.  Miranda Villegas 08/12/2013, 5:01 PM

## 2013-08-12 NOTE — ED Provider Notes (Signed)
CSN: [id number]     Arrival date & time 08/12/13  1209 History   First MD Initiated Contact with Patient 08/12/13 1213     No chief complaint on file.  (Consider location/radiation/quality/duration/timing/severity/associated sxs/prior Treatment) HPI Comments: Pt is a 22 y/o female with a PMHx of newly diagnosed mediastinal lymphoma, anxiety and depression who presents to the ED with her parents complaining of worsening neck swelling and difficulty breathing since last night. Patient had a mediastinal lymph node biopsy 4 days ago on Monday, pathology pending, since then she has been experiencing worsening pain, becoming more severe last night and this morning. States she is having difficulty breathing and swallowing, describes it as "sucking through a straw trying to breathe". The right side of her neck has become more swollen and painful. Parents [facility] to [facility] earlier today, they were not comfortable at that facility managing her symptoms, had her transported via EMS to the ED. Cardiothoracic surgeon is Dr. Dorris Fetch. She has tried taking oxycodone at home without relief, had 3 ibuprofen around 7:30 this morning. Denies fever, chills or chest pain.  Patient is a 22 y.o. female presenting with shortness of breath and neck pain. The history is provided by the patient and a parent.  Shortness of Breath Associated symptoms: neck pain   Neck Pain   Past Medical History  Diagnosis Date  . Mass in neck     right side   . Anxiety   . Depression     as a 22 year old   Past Surgical History  Procedure Laterality Date  . Tooth extraction     Family History  Problem Relation Age of Onset  . Alzheimer's disease    . Osteoarthritis Mother   . Heart disease    . Hearing loss    . Migraines Mother   . Parkinsonism    . Breast cancer    . Transient ischemic attack    . Autoimmune disease     History  Substance Use Topics  . Smoking status: Never Smoker   .  Smokeless tobacco: Never Used  . Alcohol Use: No   OB History   Grav Para Term Preterm Abortions TAB SAB Ect Mult Living                 Review of Systems  HENT: Positive for trouble swallowing.   Respiratory: Positive for shortness of breath.   Musculoskeletal: Positive for neck pain.  Hematological: Positive for adenopathy.  All other systems reviewed and are negative.    Allergies  Review of patient's allergies indicates no known allergies.  Home Medications   Current Outpatient Rx  Name  Route  Sig  Dispense  Refill  . ibuprofen (ADVIL,MOTRIN) 200 MG tablet   Oral   Take 400-600 mg by mouth every 6 (six) hours as needed for fever, headache, mild pain, moderate pain or cramping.          . loratadine (CLARITIN) 10 MG tablet   Oral   Take 10 mg by mouth daily as needed for allergies.         Marland Kitchen oxycodone (OXY-IR) 5 MG capsule   Oral   Take 5 mg by mouth every 4 (four) hours as needed.         . triamcinolone cream (KENALOG) 0.1 %   Topical   Apply 1 application topically daily as needed (for eczema flare ups).          LMP  08/05/2013 Physical Exam  Nursing note and vitals reviewed. Constitutional: She is oriented to person, place, and time. She appears well-developed and well-nourished. No distress.  HENT:  Head: Normocephalic and atraumatic.  Mouth/Throat: Oropharynx is clear and moist.  Eyes: Conjunctivae and EOM are normal. Pupils are equal, round, and reactive to light.  Neck: Normal range of motion. Neck supple. No tracheal deviation present.    Cardiovascular: Regular rhythm, normal heart sounds and intact distal pulses.   Tachy ~100  Pulmonary/Chest: Effort normal and breath sounds normal. No stridor. No respiratory distress.  Abdominal: Soft. Bowel sounds are normal. There is no tenderness.  Musculoskeletal: Normal range of motion. She exhibits no edema.  Lymphadenopathy:    She has cervical adenopathy.       Right cervical: Superficial  cervical and posterior cervical adenopathy present.       Left cervical: Superficial cervical adenopathy present.    She has axillary adenopathy.       Left axillary: Lateral adenopathy present.       Right: Supraclavicular adenopathy present.  Tender lymph nodes, right anterior cervical most tender.  Neurological: She is alert and oriented to person, place, and time.  Skin: Skin is warm and dry. She is not diaphoretic.  Psychiatric: She has a normal mood and affect. Her behavior is normal.    ED Course  Procedures (including critical care time) Labs Review Labs Reviewed  CBC WITH DIFFERENTIAL - Abnormal; Notable for the following:    Hemoglobin 9.1 (*)    HCT 29.7 (*)    MCV 74.1 (*)    MCH 22.7 (*)    RDW 15.6 (*)    Neutrophils Relative % 86 (*)    Lymphocytes Relative 6 (*)    Neutro Abs 8.1 (*)    Lymphs Abs 0.6 (*)    All other components within normal limits  BASIC METABOLIC PANEL   Imaging Review Dg Chest 2 View  08/12/2013   CLINICAL DATA:  Mediastinal mass biopsy 3 days ago. Short of breath and increased swelling of neck  EXAM: CHEST  2 VIEW  COMPARISON:  08/06/2013  FINDINGS: Large anterior mediastinal lymph node mass is unchanged allowing for AP projection.  Negative for infiltrate effusion. Negative for pneumothorax. If there is concern about SVC syndrome, consider CT chest with contrast.  IMPRESSION: Large anterior mediastinal mass is unchanged. No change from the prior chest x-ray.   Electronically Signed   By: Marlan Palau M.D.   On: 08/12/2013 13:42   Ct Soft Tissue Neck W Contrast  08/12/2013   CLINICAL DATA:  Lymphoma.  Recent biopsy.  Difficulty breathing.  EXAM: CT NECK WITH CONTRAST  TECHNIQUE: Multidetector CT imaging of the neck was performed using the standard protocol following the bolus administration of intravenous contrast.  CONTRAST:  80mL OMNIPAQUE IOHEXOL 300 MG/ML  SOLN  COMPARISON:  CT chest 07/30/2013.  FINDINGS: Extensive inflammatory reaction  accompanies widespread right greater than left cervical lymphadenopathy. This is maximal in right level II and II with conglomerate mass on the right level II station measuring 28 x 32 mm. Edema of the right greater than left vallecula, aryepiglottic fold, and vocal cords significantly narrows the airway at the level of the glottis. There is slight medialization of the right true vocal cord. Right recurrent laryngeal nerve palsy not excluded due to severe adenopathy.  Moderate mass effect on the right internal jugular without obstruction. Extensive mediastinal adenopathy persists. Normal-appearing thyroid. Negative intracranial compartment. No osseous findings.  IMPRESSION: Extensive  inflammatory reaction accompanying widespread right greater than left cervical adenopathy. Hodgkin's lymphoma is favored. Final pathology from previous mediastinal biopsy not yet available.  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 Slight medialization right true vocal cord could indicate recurrent laryngeal nerve palsy. ENT consultation for airway assessment may be warranted.   Electronically Signed   By: Davonna Belling M.D.   On: 08/12/2013 14:02    EKG Interpretation   None       MDM   1. Lymphadenopathy     Pt with known lymphoma, newly diagnosed, pathology pending, presenting with worsening adenopathy. O2 sat 100% on RA, no stridor. Significant tender adenopathy present. CT neck w contrast, cxr, cbc, bmp, pain control, decadron. Plan to consult Dr. Dorris Fetch pending results, admit for airway management. Case discussed with attending Dr. Jodi Mourning who also evaluated patient and agrees with plan of care. 2:40 PM Ct with results as shown above. I spoke with Dr. Pollyann Kennedy, ENT patient has seen recently who suggests continued monitoring of airway. No further intervention needed at this time as long as she has no respiratory  compromise and remains with O2 sat of 100% on RA. He will have Dr. Jenne Pane (ENT on call) evaluate patient upon admission. Pt reports improvement of symptoms with decadron and pain medication. 3:19 PM I also spoke with Dr. Dorris Fetch who will evaluate patient upon admission. Admission accepted by Dr. Penne Lash, [facility], step-down unit. Decadron 4 mg q 4 hours.   Trevor Mace, PA-C 08/12/13 1520

## 2013-08-12 NOTE — ED Notes (Signed)
PT from [facility] in [place] via EMS c/o neck swelling and pain with shortness. Pt has lymph node swelling. She has a biopsy from her anterior neck on Monday.

## 2013-08-12 NOTE — ED Notes (Signed)
Bed: WU98 Expected date:  Expected time:  Means of arrival:  Comments: ems- 22 yo from pcp, swollen lymph nodes/ slight respiratory distress, recent neck biopsy,

## 2013-08-12 NOTE — Progress Notes (Signed)
[facility] [phone]   [phone]  CONSULT NOTE  REFERRING PHYSICIAN: Dr. Charlett Lango  REASON FOR CONSULTATION:  22 years old white female recently diagnosed with Hodgkin lymphoma.  HPI Miranda Villegas is a 22 y.o. female was no significant past medical history except for anxiety and depression. For few months the patient noticed enlarged lymph node in the left axilla that was waxing and waning. She was treated with 2 courses of antibiotics. In October of 2014, and axillary lymph nodes was getting bigger and the patient also felt a lump in the right neck area. She was seen by her primary care physician and chest x-ray showed mediastinal lymphadenopathy. This was followed by CT scan of the chest on 07/30/2013 and it showed 5.5 x 13.7 cm soft tissue mass in the anterior/superior mediastinum, suspicious for aggregate prevascular lymphadenopathy. Additional lymphadenopathy includes: 1.6 cm short axis right supraclavicular node. 3.7 cm short axis right paratracheal node, 2.3 cm short axis left axillary node, 2.5 cm short axis left IMA node. The patient was referred to Dr. Dorris Fetch and on 08/10/2013 she underwent mediastinoscopy. The final pathology showed classical Hodgkin's lymphoma. immunohistochemical stains for CD30, CD15, LCA, CD20, CD79a, CD3, CD43, CD10 were performed on block 2A. CD30 was also performed on blocks 1A and 3C. The large atypical lymphoid cells are positive for CD30 and CD15 and negative for LCA, CD3, CD20, CD79a, CD43 and CD10. Evaluation of specimen 1 and 3 for CD30 also shows scattering of CD30 positive large lymphoid cells. The small lymphoid cells in the background show predominance of T cells as seen with CD3 and CD43. There is a minor component  of B cells as seen with CD20 and CD79a. The overall histologic and phenotypic features are consistent with classical Hodgkin's lymphoma which is best classified as nodular sclerosis  subtype. The patient was admitted to the hospital yesterday complaining of increasing dyspnea and she had CT scan of the neck performed on 08/12/2013 and showed extensive inflammatory reaction accompanying widespread right greater than left cervical adenopathy. 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 There was slight medialization of the right true vocal cords could indicate recurrent laryngeal nerve palsy. She was started on Decadron yesterday and felt much better. The patient denied having any other significant complaints except for occasional shortness of breath. She has no fever or chills. She denied having any significant weight loss or night sweats.   HPI  Past Medical History  Diagnosis Date  . Mass in neck     right side   . Anxiety   . Depression     as a 22 year old  . Cancer 08/12/13    lymphoma recently dx    Past Surgical History  Procedure Laterality Date  . Tooth extraction    . Lymph node biopsy  08/09/13    medial neck area - lymphoma dx.    Family History  Problem Relation Age of Onset  . Alzheimer's disease    . Osteoarthritis Mother   . Heart disease    . Hearing loss    . Migraines Mother   . Parkinsonism    . Breast cancer    . Transient ischemic attack    . Autoimmune disease      Social History History  Substance Use Topics  . Smoking status: Never Smoker   . Smokeless tobacco: Never Used  .  Alcohol Use: No    No Known Allergies  Current Facility-Administered Medications  Medication Dose Route Frequency Provider Last Rate Last Dose  . 0.9 %  sodium chloride infusion   Intravenous Continuous Estela Isaiah Blakes, MD      . acetaminophen (TYLENOL) tablet 650 mg  650 mg Oral Q6H PRN Henderson Cloud, MD       Or  . acetaminophen (TYLENOL) suppository 650 mg  650 mg Rectal Q6H PRN Henderson Cloud, MD        . dexamethasone (DECADRON) injection 4 mg  4 mg Intravenous Q4H Robyn M Albert, PA-C      . enoxaparin (LOVENOX) injection 40 mg  40 mg Subcutaneous Q24H Estela Isaiah Blakes, MD      . HYDROmorphone (DILAUDID) injection 1 mg  1 mg Intravenous Q4H PRN Trevor Mace, PA-C      . morphine 2 MG/ML injection 0.5 mg  0.5 mg Intravenous Q4H PRN Henderson Cloud, MD      . ondansetron [facility]) tablet 4 mg  4 mg Oral Q6H PRN Henderson Cloud, MD       Or  . ondansetron [facility]) injection 4 mg  4 mg Intravenous Q6H PRN Henderson Cloud, MD      . senna-docusate (Senokot-S) tablet 1 tablet  1 tablet Oral QHS PRN Henderson Cloud, MD        Review of Systems  Constitutional: negative Eyes: negative Ears, nose, mouth, throat, and face: negative Respiratory: positive for dyspnea on exertion Cardiovascular: negative Gastrointestinal: negative Genitourinary:negative Integument/breast: negative Hematologic/lymphatic: negative Musculoskeletal:negative Neurological: negative Behavioral/Psych: negative Endocrine: negative Allergic/Immunologic: negative  Physical Exam  ZOX:WRUEA, healthy, no distress, well nourished and well developed SKIN: skin color, texture, turgor are normal HEAD: Normocephalic, No masses, lesions, tenderness or abnormalities EYES: normal, PERRLA EARS: External ears normal, Canals clear OROPHARYNX:no exudate, no erythema and lips, buccal mucosa, and tongue normal  NECK: supple, palpable lower right cervical lymphadenopathy. LYMPH:  enlarged lymph nodes palpated in the right lower cervical and left axillary areas. BREAST:not examined LUNGS: clear to auscultation , and palpation HEART: regular rate & rhythm, no murmurs and no gallops ABDOMEN:abdomen soft, non-tender, obese, normal bowel sounds and no masses or organomegaly BACK: Back symmetric, no curvature., No CVA tenderness EXTREMITIES:no joint deformities, effusion, or  inflammation, no edema, no skin discoloration, no clubbing  NEURO: alert & oriented x 3 with fluent speech, no focal motor/sensory deficits  PERFORMANCE STATUS: ECOG 1  LABORATORY DATA: Lab Results  Component Value Date   WBC 9.5 08/12/2013   HGB 9.1* 08/12/2013   HCT 29.7* 08/12/2013   MCV 74.1* 08/12/2013   PLT 376 08/12/2013    @LASTCHEM @  RADIOGRAPHIC STUDIES: Dg Chest 2 View  08/12/2013   CLINICAL DATA:  Mediastinal mass biopsy 3 days ago. Short of breath and increased swelling of neck  EXAM: CHEST  2 VIEW  COMPARISON:  08/06/2013  FINDINGS: Large anterior mediastinal lymph node mass is unchanged allowing for AP projection.  Negative for infiltrate effusion. Negative for pneumothorax. If there is concern about SVC syndrome, consider CT chest with contrast.  IMPRESSION: Large anterior mediastinal mass is unchanged. No change from the prior chest x-ray.   Electronically Signed   By: Marlan Palau M.D.   On: 08/12/2013 13:42   Dg Chest 2 View Within Previous 72 Hours.  Films Obtained On Friday Are Acceptable For Monday And Tuesday Cases  08/06/2013   CLINICAL  DATA:  Neck mass  EXAM: CHEST  2 VIEW  COMPARISON:  Chest x-ray dated July 20 2013. And chest CT scan dated July 30, 2013.  FINDINGS: There large paratracheal soft tissue masses obscuring the upper mediastinal heart borders. The trachea is not deviated. The mid and peripheral portions of both lungs appear clear. The cardiac silhouette is not enlarged. There is no pleural effusion. There is no evidence of a pneumothorax.  1.  IMPRESSION: There are known large anterior and superior mediastinal masses. There is no evidence of a pneumothorax or pleural effusion.   Electronically Signed   By: David  [place]   On: 08/06/2013 11:21   Ct Soft Tissue Neck W Contrast  08/12/2013   CLINICAL DATA:  Lymphoma.  Recent biopsy.  Difficulty breathing.  EXAM: CT NECK WITH CONTRAST  TECHNIQUE: Multidetector CT imaging of the neck was performed  using the standard protocol following the bolus administration of intravenous contrast.  CONTRAST:  80mL OMNIPAQUE IOHEXOL 300 MG/ML  SOLN  COMPARISON:  CT chest 07/30/2013.  FINDINGS: Extensive inflammatory reaction accompanies widespread right greater than left cervical lymphadenopathy. This is maximal in right level II and II with conglomerate mass on the right level II station measuring 28 x 32 mm. Edema of the right greater than left vallecula, aryepiglottic fold, and vocal cords significantly narrows the airway at the level of the glottis. There is slight medialization of the right true vocal cord. Right recurrent laryngeal nerve palsy not excluded due to severe adenopathy.  Moderate mass effect on the right internal jugular without obstruction. Extensive mediastinal adenopathy persists. Normal-appearing thyroid. Negative intracranial compartment. No osseous findings.  IMPRESSION: Extensive inflammatory reaction accompanying widespread right greater than left cervical adenopathy. Hodgkin's lymphoma is favored. Final pathology from previous mediastinal biopsy not yet available.  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 Slight medialization right true vocal cord could indicate recurrent laryngeal nerve palsy. ENT consultation for airway assessment may be warranted.   Electronically Signed   By: Davonna Belling M.D.   On: 08/12/2013 14:02   Ct Chest W Contrast  07/30/2013   CLINICAL DATA:  Mediastinal lymphadenopathy on chest radiograph  EXAM: CT CHEST WITH CONTRAST  TECHNIQUE: Multidetector CT imaging of the chest was performed during intravenous contrast administration.  CONTRAST:  75mL OMNIPAQUE IOHEXOL 300 MG/ML  SOLN  COMPARISON:  Chest radiographs dated 07/20/2013  FINDINGS: Minimal/trace right pleural fluid. Minimal dependent atelectasis. Lungs are otherwise clear. No pneumothorax.  Visualized thyroid  is unremarkable.  Heart is normal in size. No pericardial effusion.  5.5 x 13.7 cm soft tissue mass in the anterior/superior mediastinum (series 2/ image 20), suspicious for aggregate prevascular lymphadenopathy.  Additional lymphadenopathy includes:  --1.6 cm short axis right supraclavicular node (series 2/image 5)  --3.7 cm short axis right paratracheal node (series 2/ image 15)  --2.3 cm short axis left axillary node (series 2/ image 15)  --2.5 cm short axis left IMA node (series 2/ image 27)  Visualized upper abdomen is unremarkable. No visualized lymphadenopathy.  Visualized osseous structures are within normal limits.  IMPRESSION: 5.5 x 13.7 cm soft tissue mass in the anterior/superior mediastinum, corresponding to the radiographic abnormality, suspicious for aggregate prevascular lymphadenopathy related to (Hodgkin's) lymphoma.  Additional thoracic lymphadenopathy, as described above.  Consider biopsy or surgical resection of left axillary nodes for tissue confirmation. Additionally, PET-CT could be considered for further staging.   Electronically Signed  By: Charline Bills M.D.   On: 07/30/2013 10:10    ASSESSMENT: This is a very pleasant 22 years old white female recently diagnosed with stage II Hodgkin's lymphoma, nodular sclerosis type, involving the mediastinum, right lower cervical and left axillary lymph nodes.   PLAN: I have a lengthy discussion with the patient and her family today about her current disease stage, prognosis and treatment options. I ordered several studies to complete the staging workup of her disease. I will order a PET scan which most likely will be done an outpatient basis after the patient discharged from the hospital. I also ordered a bone marrow biopsy and aspirate. I ordered a pulmonary function test and 2-D echo. I discussed with the patient and her family at treatment with at least 4 cycles of ABVD followed by involved field radiation to the residual  disease. I discussed with the patient and her family the adverse effect of this treatment including but not limited to alopecia, myelosuppression, nausea and vomiting, pulmonary fibrosis, cardiac dysfunction, liver or renal dysfunction in addition to peripheral neuropathy. Once the above staging workup is completed, I would like to start her treatment as soon as possible especially with her current respiratory distress. I will arrange for the patient to have a chemotherapy patient as before starting the first cycle of her treatment. The patient voices understanding of current disease status and treatment options and is in agreement with the current care plan.  All questions were answered. The patient knows to call the clinic with any problems, questions or concerns. We can certainly see the patient much sooner if necessary.  Thank you so much for allowing me to participate in the care of Miranda Villegas. I will continue to follow up the patient with you and assist in her care.  Wesson Stith K. 08/12/2013, 6:06 PM

## 2013-08-12 NOTE — Progress Notes (Signed)
   CARE MANAGEMENT ED NOTE 08/12/2013  Patient:  Miranda Villegas, Miranda Villegas   Account Number:  [id number]  Date Initiated:  08/12/2013  Documentation initiated by:  Edd Arbour  Subjective/Objective Assessment:   22 yr old female [employer] pt followed by cancer center by dr Shirline Frees confirmed pcp is [facility] 10 mg of IV decadron given in the ED     Subjective/Objective Assessment Detail:   presented today for surgery, with the diagnosis of MEDIASTINAL ADENOPATHY  with dr Viviann Spare c [facility] Cardothoraic surgeon admit to SDU overnight for close monitoring, continue IV steroids     Action/Plan:   Cm spoke with pt and mother Updated EPIC   Action/Plan Detail:   Anticipated DC Date:  08/15/2013     Status Recommendation to Physician:   Result of Recommendation:    Other ED Services  Consult Working Plan    DC Planning Services  Other  Outpatient Services - Pt will follow up    Choice offered to / List presented to:            Status of service:  Completed, signed off  ED Comments:   ED Comments Detail:

## 2013-08-12 NOTE — Telephone Encounter (Signed)
Called and spoke with pt and mother on phone.  They stated pt was admitted to hospital.  Dr. Dorris Fetch called on another phone line.  I will notify Dr. Arbutus Ped pt is in the hospital and pt needs to be seen

## 2013-08-12 NOTE — H&P (Signed)
[facility]          History and Physical    PCP:   No PCP Per Patient   Chief Complaint:  Dyspnea  HPI: Pleasant 22 y/o young woman without significant PMH who just 3 days ago had a mediastinoscopy and biopsy for a mediastinal mass. She woke up today and felt like she couldn't breathe. She states "it felt like trying to breathe through a straw". Her parents immediately brought her to the hospital. A CT scan of the neck showed: Extensive inflammatory reaction accompanying widespread right  greater than left cervical adenopathy. Hodgkin's lymphoma is  Favored. ED has already contacted Dr. Pollyann Kennedy (ENT) and Dr. Dorris Fetch (CVTS) who will see her in consultation. We have been asked to admit her for further evaluation and management.   Allergies:  No Known Allergies    Past Medical History  Diagnosis Date  . Mass in neck     right side   . Anxiety   . Depression     as a 22 year old  . Cancer 08/12/13    lymphoma recently dx    Past Surgical History  Procedure Laterality Date  . Tooth extraction    . Lymph node biopsy  08/09/13    medial neck area - lymphoma dx.    Prior to Admission medications   Medication Sig Start Date End Date Taking? Authorizing Provider  ibuprofen (ADVIL,MOTRIN) 200 MG tablet Take 400-600 mg by mouth every 6 (six) hours as needed for fever, headache, mild pain, moderate pain or cramping.    Yes Historical Provider, MD  loratadine (CLARITIN) 10 MG tablet Take 10 mg by mouth daily as needed for allergies.   Yes Historical Provider, MD  oxycodone (OXY-IR) 5 MG capsule Take 5 mg by mouth every 4 (four) hours as needed.   Yes Historical Provider, MD  triamcinolone cream (KENALOG) 0.1 % Apply 1 application topically daily as needed (for eczema flare ups).   Yes Historical Provider, MD    Social History:  reports that she has never smoked. She has never used smokeless tobacco. She reports that she does not drink alcohol or use illicit  drugs.  Family History  Problem Relation Age of Onset  . Alzheimer's disease    . Osteoarthritis Mother   . Heart disease    . Hearing loss    . Migraines Mother   . Parkinsonism    . Breast cancer    . Transient ischemic attack    . Autoimmune disease      Review of Systems:  Constitutional: Denies fever, chills, diaphoresis, appetite change and fatigue.  HEENT: Denies photophobia, eye pain, redness, hearing loss, ear pain, congestion, sore throat, rhinorrhea, sneezing, mouth sores, trouble swallowing, neck pain, neck stiffness and tinnitus.   Respiratory: Denies  cough, chest tightness,  and wheezing.   Cardiovascular: Denies chest pain, palpitations and leg swelling.  Gastrointestinal: Denies nausea, vomiting, abdominal pain, diarrhea, constipation, blood in stool and abdominal distention.  Genitourinary: Denies dysuria, urgency, frequency, hematuria, flank pain and difficulty urinating.  Endocrine: Denies: hot or cold intolerance, sweats, changes in hair or nails, polyuria, polydipsia. Musculoskeletal: Denies myalgias, back pain, joint swelling, arthralgias and gait problem.  Skin: Denies pallor, rash and wound.  Neurological: Denies dizziness, seizures, syncope, weakness, light-headedness, numbness and headaches.  Hematological: Denies adenopathy. Easy bruising, personal or family bleeding history  Psychiatric/Behavioral: Denies suicidal ideation, mood changes, confusion, nervousness, sleep disturbance and agitation   Physical Exam: Blood pressure 100/53, pulse 99,  temperature 99 F (37.2 C), temperature source Oral, resp. rate 27, last menstrual period 08/05/2013, SpO2 98.00%. Gen: AA Ox3, NAD, pleasanr and cooperative with exam. HEENT: [place]/AT/PERRL/EOMI/wears corrective lenses, moist mucous membranes. Neck: supple, no JVD, extensive cervical LAD, mostly on the right. CV: RRR, no M/R/G Lungs: CTA B Abd: S/NT/ND/+BS Ext: no C/C/E/+pedal pulses Neuro: grossly intact and  non-focal  Labs on Admission:  Results for orders placed during the hospital encounter of 08/12/13 (from the past 48 hour(s))  CBC WITH DIFFERENTIAL     Status: Abnormal   Collection Time    08/12/13 12:55 PM      Result Value Range   WBC 9.5  4.0 - 10.5 K/uL   RBC 4.01  3.87 - 5.11 MIL/uL   Hemoglobin 9.1 (*) 12.0 - 15.0 g/dL   HCT 11.9 (*) 14.7 - 82.9 %   MCV 74.1 (*) 78.0 - 100.0 fL   MCH 22.7 (*) 26.0 - 34.0 pg   MCHC 30.6  30.0 - 36.0 g/dL   RDW 56.2 (*) 13.0 - 86.5 %   Platelets 376  150 - 400 K/uL   Neutrophils Relative % 86 (*) 43 - 77 %   Lymphocytes Relative 6 (*) 12 - 46 %   Monocytes Relative 7  3 - 12 %   Eosinophils Relative 1  0 - 5 %   Basophils Relative 0  0 - 1 %   Neutro Abs 8.1 (*) 1.7 - 7.7 K/uL   Lymphs Abs 0.6 (*) 0.7 - 4.0 K/uL   Monocytes Absolute 0.7  0.1 - 1.0 K/uL   Eosinophils Absolute 0.1  0.0 - 0.7 K/uL   Basophils Absolute 0.0  0.0 - 0.1 K/uL   Smear Review MORPHOLOGY UNREMARKABLE    BASIC METABOLIC PANEL     Status: None   Collection Time    08/12/13 12:55 PM      Result Value Range   Sodium 137  135 - 145 mEq/L   Potassium 4.0  3.5 - 5.1 mEq/L   Chloride 101  96 - 112 mEq/L   CO2 27  19 - 32 mEq/L   Glucose, Bld 88  70 - 99 mg/dL   BUN 10  6 - 23 mg/dL   Creatinine, Ser 7.84  0.50 - 1.10 mg/dL   Calcium 9.4  8.4 - 69.6 mg/dL   GFR calc non Af Amer >90  >90 mL/min   GFR calc Af Amer >90  >90 mL/min   Comment: (NOTE)     The eGFR has been calculated using the CKD EPI equation.     This calculation has not been validated in all clinical situations.     eGFR's persistently <90 mL/min signify possible Chronic Kidney     Disease.    Radiological Exams on Admission: Dg Chest 2 View  08/12/2013   CLINICAL DATA:  Mediastinal mass biopsy 3 days ago. Short of breath and increased swelling of neck  EXAM: CHEST  2 VIEW  COMPARISON:  08/06/2013  FINDINGS: Large anterior mediastinal lymph node mass is unchanged allowing for AP projection.   Negative for infiltrate effusion. Negative for pneumothorax. If there is concern about SVC syndrome, consider CT chest with contrast.  IMPRESSION: Large anterior mediastinal mass is unchanged. No change from the prior chest x-ray.   Electronically Signed   By: Marlan Palau M.D.   On: 08/12/2013 13:42   Ct Soft Tissue Neck W Contrast  08/12/2013   CLINICAL DATA:  Lymphoma.  Recent biopsy.  Difficulty breathing.  EXAM: CT NECK WITH CONTRAST  TECHNIQUE: Multidetector CT imaging of the neck was performed using the standard protocol following the bolus administration of intravenous contrast.  CONTRAST:  80mL OMNIPAQUE IOHEXOL 300 MG/ML  SOLN  COMPARISON:  CT chest 07/30/2013.  FINDINGS: Extensive inflammatory reaction accompanies widespread right greater than left cervical lymphadenopathy. This is maximal in right level II and II with conglomerate mass on the right level II station measuring 28 x 32 mm. Edema of the right greater than left vallecula, aryepiglottic fold, and vocal cords significantly narrows the airway at the level of the glottis. There is slight medialization of the right true vocal cord. Right recurrent laryngeal nerve palsy not excluded due to severe adenopathy.  Moderate mass effect on the right internal jugular without obstruction. Extensive mediastinal adenopathy persists. Normal-appearing thyroid. Negative intracranial compartment. No osseous findings.  IMPRESSION: Extensive inflammatory reaction accompanying widespread right greater than left cervical adenopathy. Hodgkin's lymphoma is favored. Final pathology from previous mediastinal biopsy not yet available.  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 Slight medialization right true vocal cord could indicate recurrent laryngeal nerve palsy. ENT consultation for airway assessment may be warranted.   Electronically Signed   By: Davonna Belling  M.D.   On: 08/12/2013 14:02    Assessment/Plan Principal Problem:   Dyspnea Active Problems:   Microcytic anemia   Dyspnea -2/2 extensive cervical LAD. -Pathology pending, but suspect lymphoma as etiology. -She has had significant improvement after 10 mg of IV decadron given in the ED. -She is saturating 100% on RA and does not  have increased work of breathing. -Plan to admit to SDU overnight for close monitoring, continue IV steroids. -EDP has consulted Dr. Pollyann Kennedy and Dr. Dorris Fetch, who will see patient in consultation. -May consult oncology in-house once pathology returns.  Microcytic Anemia -Suspect iron deficiency. -Check anemia panel.  Mediastinal Mass/Lymphadenoapthy -As above. -Await pathology results.  DVT Prophylaxis -Lovenox.  Code Status -Full code.  Time Spent on Admission: 70 minutes  HERNANDEZ ACOSTA,ESTELA [facility] Pager: [phone] 08/12/2013, 4:07 PM

## 2013-08-12 NOTE — ED Provider Notes (Signed)
Medical screening examination/treatment/procedure(s) were conducted as a shared visit with non-physician practitioner(s) or resident  and myself.  I personally evaluated the patient during the encounter and agree with the findings and plan unless otherwise indicated.    I have personally reviewed any xrays and/ or EKG's with the provider and I agree with interpretation.   Recent work up for likely lymphoma, worsening neck swelling and breathing difficulties the past week, biopsies pending, no current oncologist. Exam significant mild tender anterior cervical adenopathy worse right side, mild dry mm, no stridor, pharynx clear, lungs clear, abd soft/ NT.  CT scan showed significant edema, pt improved with steroids in ED.  No resp distress in ED. Admitted.  Lymphadenopathy, Dyspnea  Enid Skeens, MD 08/12/13 1744

## 2013-08-12 NOTE — Progress Notes (Signed)
Miss Miranda Villegas was admitted with respiratory difficulties this morning.  Ct of neck showed progression of her cervical adenopathy and some airway compromise.  She was treated in the ED with IV steroids  I spoke with pathology and the biopsy showed Classical Hodgkin's lymphoma  I had discussed the case with Dr. Arbutus Ped from Oncology this morning prior to getting the path report.  I spoke with Willette Pa when I learned Miranda Villegas was admitted.  Oncology will see in AM

## 2013-08-13 ENCOUNTER — Encounter (HOSPITAL_COMMUNITY): Payer: Self-pay | Admitting: Thoracic Surgery (Cardiothoracic Vascular Surgery)

## 2013-08-13 ENCOUNTER — Other Ambulatory Visit: Payer: Self-pay | Admitting: Internal Medicine

## 2013-08-13 ENCOUNTER — Telehealth: Payer: Self-pay | Admitting: Internal Medicine

## 2013-08-13 ENCOUNTER — Inpatient Hospital Stay (HOSPITAL_COMMUNITY): Payer: 59

## 2013-08-13 DIAGNOSIS — Z01810 Encounter for preprocedural cardiovascular examination: Secondary | ICD-10-CM

## 2013-08-13 LAB — BASIC METABOLIC PANEL
BUN: 10 mg/dL (ref 6–23)
CO2: 25 mEq/L (ref 19–32)
Glucose, Bld: 148 mg/dL — ABNORMAL HIGH (ref 70–99)
Potassium: 3.8 mEq/L (ref 3.5–5.1)
Sodium: 136 mEq/L (ref 135–145)

## 2013-08-13 LAB — PULMONARY FUNCTION TEST
DLCO cor: 33.45 ml/min/mmHg
DLCO unc: 28.31 ml/min/mmHg
FEF 25-75 Pre: 2.52 L/sec
FEF2575-%Change-Post: 24 %
FEV1-%Change-Post: 6 %
FEV1-%Pred-Post: 80 %
FEV1-Post: 3.06 L
FEV1-Pre: 2.89 L
FEV1FVC-%Pred-Pre: 90 %
FEV6-%Pred-Post: 81 %
FEV6-Post: 3.61 L
FEV6-Pre: 3.71 L
FEV6FVC-%Pred-Pre: 100 %
FVC-%Pred-Pre: 83 %
FVC-Post: 3.61 L
Post FEV1/FVC ratio: 85 %
Post FEV6/FVC ratio: 100 %
Pre FEV6/FVC Ratio: 100 %
RV: 1.26 L
TLC % pred: 77 %
TLC: 4.48 L

## 2013-08-13 LAB — CBC
Hemoglobin: 9.3 g/dL — ABNORMAL LOW (ref 12.0–15.0)
MCH: 22.5 pg — ABNORMAL LOW (ref 26.0–34.0)
MCHC: 30.7 g/dL (ref 30.0–36.0)
MCV: 73.4 fL — ABNORMAL LOW (ref 78.0–100.0)
RBC: 4.13 MIL/uL (ref 3.87–5.11)

## 2013-08-13 LAB — URIC ACID: Uric Acid, Serum: 3.5 mg/dL (ref 2.4–7.0)

## 2013-08-13 LAB — SEDIMENTATION RATE: Sed Rate: 83 mm/hr — ABNORMAL HIGH (ref 0–22)

## 2013-08-13 LAB — LACTATE DEHYDROGENASE: LDH: 502 U/L — ABNORMAL HIGH (ref 94–250)

## 2013-08-13 MED ORDER — ENOXAPARIN SODIUM 40 MG/0.4ML ~~LOC~~ SOLN
40.0000 mg | SUBCUTANEOUS | Status: DC
  Administered 2013-08-13 – 2013-08-14 (×2): 40 mg via SUBCUTANEOUS
  Filled 2013-08-13 (×4): qty 0.4

## 2013-08-13 MED ORDER — PERFLUTREN LIPID MICROSPHERE
1.0000 mL | INTRAVENOUS | Status: AC | PRN
  Administered 2013-08-13: 2 mL via INTRAVENOUS
  Filled 2013-08-13 (×2): qty 10

## 2013-08-13 MED ORDER — ALBUTEROL SULFATE (5 MG/ML) 0.5% IN NEBU
2.5000 mg | INHALATION_SOLUTION | Freq: Once | RESPIRATORY_TRACT | Status: AC
  Administered 2013-08-13: 2.5 mg via RESPIRATORY_TRACT

## 2013-08-13 MED ORDER — ALLOPURINOL 300 MG PO TABS
300.0000 mg | ORAL_TABLET | Freq: Every day | ORAL | Status: DC
  Administered 2013-08-13 – 2013-08-15 (×3): 300 mg via ORAL
  Filled 2013-08-13 (×3): qty 1

## 2013-08-13 MED ORDER — INFLUENZA VAC SPLIT QUAD 0.5 ML IM SUSP
0.5000 mL | Freq: Once | INTRAMUSCULAR | Status: AC
  Administered 2013-08-13: 0.5 mL via INTRAMUSCULAR
  Filled 2013-08-13: qty 0.5

## 2013-08-13 MED ORDER — LORAZEPAM 1 MG PO TABS
1.0000 mg | ORAL_TABLET | Freq: Four times a day (QID) | ORAL | Status: DC | PRN
  Administered 2013-08-14: 2 mg via ORAL
  Filled 2013-08-13: qty 2

## 2013-08-13 MED ORDER — IOHEXOL 300 MG/ML  SOLN
100.0000 mL | Freq: Once | INTRAMUSCULAR | Status: AC | PRN
  Administered 2013-08-13: 100 mL via INTRAVENOUS

## 2013-08-13 MED ORDER — CHLORHEXIDINE GLUCONATE CLOTH 2 % EX PADS: 6.0000 | MEDICATED_PAD | Freq: Every day | CUTANEOUS | Status: DC

## 2013-08-13 MED ORDER — SODIUM CHLORIDE 0.9 % IV SOLN
INTRAVENOUS | Status: DC
  Administered 2013-08-14 – 2013-08-15 (×3): via INTRAVENOUS

## 2013-08-13 MED ORDER — HYDROCODONE-ACETAMINOPHEN 5-325 MG PO TABS
1.0000 | ORAL_TABLET | ORAL | Status: DC | PRN
  Administered 2013-08-13 – 2013-08-14 (×2): 2 via ORAL
  Filled 2013-08-13 (×2): qty 2

## 2013-08-13 MED ORDER — IOHEXOL 300 MG/ML  SOLN
50.0000 mL | Freq: Once | INTRAMUSCULAR | Status: AC | PRN
  Administered 2013-08-13: 50 mL via ORAL

## 2013-08-13 MED ORDER — DEXAMETHASONE SODIUM PHOSPHATE 4 MG/ML IJ SOLN
4.0000 mg | Freq: Two times a day (BID) | INTRAMUSCULAR | Status: DC
  Administered 2013-08-13 – 2013-08-15 (×4): 4 mg via INTRAVENOUS
  Filled 2013-08-13 (×5): qty 1

## 2013-08-13 MED ORDER — MUPIROCIN 2 % EX OINT: TOPICAL_OINTMENT | Freq: Two times a day (BID) | CUTANEOUS | Status: DC

## 2013-08-13 MED ORDER — MUPIROCIN 2 % EX OINT
1.0000 "application " | TOPICAL_OINTMENT | Freq: Two times a day (BID) | CUTANEOUS | Status: DC
  Filled 2013-08-13 (×2): qty 22

## 2013-08-13 NOTE — Progress Notes (Signed)
Echocardiogram 2D Echocardiogram with definity has been performed.  Vishal Sandlin 08/13/2013, 10:31 AM

## 2013-08-13 NOTE — Progress Notes (Signed)
ICU staff referred me to patient because of her age and new diagnosis. Visited with patient and mother. Offered support and explained services. Brought her a prayer shawl and per her request, prayed.

## 2013-08-13 NOTE — Procedures (Signed)
US/fluoroscopic guided right basilic vein DL PICC placed. Length 41 cm. Tip SVC/RA junction. No immediate complications.

## 2013-08-13 NOTE — Care Management Note (Signed)
   CARE MANAGEMENT NOTE 08/13/2013  Patient:  Miranda Villegas, Miranda Villegas   Account Number:  [id number]  Date Initiated:  08/13/2013  Documentation initiated by:  Sani Loiseau  Subjective/Objective Assessment:   22 yo female admitted with Hodgkin's disease with dyspnea secondary to lymphadenopathy and inflammatory reaction status post mediastinoscopy.     Action/Plan:   Home when stable   Anticipated DC Date:     Anticipated DC Plan:  HOME/SELF CARE      DC Planning Services  CM consult      Choice offered to / List presented to:  NA   DME arranged  NA      DME agency  NA     HH arranged  NA      HH agency  NA   Status of service:  In process, will continue to follow Medicare Important Message given?   (If response is "NO", the following Medicare IM given date fields will be blank) Date Medicare IM given:   Date Additional Medicare IM given:    Discharge Disposition:    Per UR Regulation:  Reviewed for med. necessity/level of care/duration of stay  If discussed at Long Length of Stay Meetings, dates discussed:    Comments:  08/13/13 1516 Ellison Leisure,RN,MSN [phone] Chart reviewed for utilization of services. Will continue to follow.

## 2013-08-13 NOTE — Telephone Encounter (Signed)
per 11/20 pof - lb/fu/ched next week and pet ASAP.  per 11/21 pof cx ordered lb for next wk - cx ched - cx appt  w/MM for next wk. no appts scheduled per 11/21 pof - pt not contacted re any appts

## 2013-08-13 NOTE — Progress Notes (Signed)
[facility] PROGRESS NOTE   Aften Lipsey MVH:[id number] DOB: 02/02/1991 DOA: 08/12/2013 PCP: No primary provider on file.  Brief narrative: Miranda Villegas is an 22 y.o. female with recently diagnosed, biopsy confirmed Hodgkin's lymphoma, status post mediastinoscopy/biopsy on 08/09/2013, who was admitted on 08/12/2013 with dyspnea.  A CT scan of the neck showed extensive inflammatory reaction accompanying widespread right greater than left cervical adenopathy. ENT and CVTS consultations were requested. She was placed on empiric steroids with improvement.  Assessment/Plan: Principal Problem:   Hodgkin's disease with dyspnea secondary to lymphadenopathy and inflammatory reaction status post mediastinoscopy The patient was admitted to the step down unit and placed on IV Decadron. Evaluated by Dr. Jenne Pane ENT on 08/12/2013. No observed hypoxia.  Wean decadron. To complete staging evaluation today with a CT scan of the abdomen and pelvis as well as a bone marrow biopsy. Also will receive pretreatment 2-D echocardiogram and pulmonary function tests. Plan is to start chemotherapy over the weekend. Start on allopurinol secondary to risk of tumor lysis with bulky mediastinal disease. We'll have a PICC line placed in anticipation of initiation of chemotherapy. Active Problems:   MRSA nasal colonization Order decontamination therapy with intranasal Bactroban and chlorhexidine scrubs.   Microcytic / iron deficiency anemia Likely secondary to iron deficiency from menstrual losses. Scheduled for bone marrow biopsy today.   Code Status: Full. Family Communication: Mother updated at bedside. Disposition Plan: Home when stable.   IV access:  Peripheral IV  Medical Consultants:  Dr. Christia Reading, ENT  Dr. Si Gaul, Oncology  Other Consultants:  None.  Anti-infectives:  None.  HPI/Subjective: Miranda Villegas has no complaints of dyspnea today. She is a bit teary-eyed and anxious  about her diagnosis. No cough. No fevers.  Objective: Filed Vitals:   08/12/13 2000 08/13/13 0000 08/13/13 0200 08/13/13 0400  BP:   133/77   Pulse:      Temp: 98.4 F (36.9 C) 98.2 F (36.8 C)  98.4 F (36.9 C)  TempSrc: Oral Oral  Oral  Resp:      SpO2:        Intake/Output Summary (Last 24 hours) at 08/13/13 0716 Last data filed at 08/13/13 0400  Gross per 24 hour  Intake 799.17 ml  Output    500 ml  Net 299.17 ml    Exam: Gen:  NAD Cardiovascular:  RRR, No M/R/G Respiratory:  Lungs CTAB Gastrointestinal:  Abdomen soft, NT/ND, + BS Extremities:  No C/E/C  Data Reviewed: asic Metabolic Panel:  Recent Labs Lab 08/06/13 0946 08/12/13 1255 08/13/13 0327  NA 139 137 136  K 4.2 4.0 3.8  CL 101 101 101  CO2 26 27 25   GLUCOSE 90 88 148*  BUN 9 10 10   CREATININE 0.78 0.72 0.58  CALCIUM 9.3 9.4 9.2   GFR The CrCl is unknown because both a height and weight (above a minimum accepted value) are required for this calculation. Liver Function Tests:  Recent Labs Lab 08/06/13 0946  AST 10  ALT 10  ALKPHOS 103  BILITOT 0.3  PROT 7.6  ALBUMIN 3.6   Coagulation profile  Recent Labs Lab 08/06/13 0946  INR 1.08    CBC:  Recent Labs Lab 08/06/13 0946 08/12/13 1255 08/13/13 0327  WBC 9.5 9.5 7.7  NEUTROABS  --  8.1*  --   HGB 11.0* 9.1* 9.3*  HCT 35.8* 29.7* 30.3*  MCV 73.8* 74.1* 73.4*  PLT 440* 376 416*   Anemia work up  Recent Labs  08/12/13 1255  VITAMINB12 1849*  FOLATE 10.4  FERRITIN 138  TIBC Not calculated due to Iron <10.  IRON <10*  RETICCTPCT 1.0   Microbiology Recent Results (from the past 240 hour(s))  SURGICAL PCR SCREEN     Status: Abnormal   Collection Time    08/06/13  9:44 AM      Result Value Range Status   MRSA, PCR NEGATIVE  NEGATIVE Final   Staphylococcus aureus POSITIVE (*) NEGATIVE Final   Comment:            The Xpert SA Assay (FDA     approved for NASAL specimens     in patients over 25 years of age),      is one component of     a comprehensive surveillance     program.  Test performance has     been validated by [employer] for patients greater     than or equal to 34 year old.     It is not intended     to diagnose infection nor to     guide or monitor treatment.     Procedures and Diagnostic Studies: Dg Chest 2 View  08/12/2013   CLINICAL DATA:  Mediastinal mass biopsy 3 days ago. Short of breath and increased swelling of neck  EXAM: CHEST  2 VIEW  COMPARISON:  08/06/2013  FINDINGS: Large anterior mediastinal lymph node mass is unchanged allowing for AP projection.  Negative for infiltrate effusion. Negative for pneumothorax. If there is concern about SVC syndrome, consider CT chest with contrast.  IMPRESSION: Large anterior mediastinal mass is unchanged. No change from the prior chest x-ray.   Electronically Signed   By: Marlan Palau M.D.   On: 08/12/2013 13:42   Dg Chest 2 View Within Previous 72 Hours.  Films Obtained On Friday Are Acceptable For Monday And Tuesday Cases  08/06/2013   CLINICAL DATA:  Neck mass  EXAM: CHEST  2 VIEW  COMPARISON:  Chest x-ray dated July 20 2013. And chest CT scan dated July 30, 2013.  FINDINGS: There large paratracheal soft tissue masses obscuring the upper mediastinal heart borders. The trachea is not deviated. The mid and peripheral portions of both lungs appear clear. The cardiac silhouette is not enlarged. There is no pleural effusion. There is no evidence of a pneumothorax.  1.  IMPRESSION: There are known large anterior and superior mediastinal masses. There is no evidence of a pneumothorax or pleural effusion.   Electronically Signed   By: David  [place]   On: 08/06/2013 11:21   Ct Soft Tissue Neck W Contrast  08/12/2013   CLINICAL DATA:  Lymphoma.  Recent biopsy.  Difficulty breathing.  EXAM: CT NECK WITH CONTRAST  TECHNIQUE: Multidetector CT imaging of the neck was performed using the standard protocol following the bolus administration  of intravenous contrast.  CONTRAST:  80mL OMNIPAQUE IOHEXOL 300 MG/ML  SOLN  COMPARISON:  CT chest 07/30/2013.  FINDINGS: Extensive inflammatory reaction accompanies widespread right greater than left cervical lymphadenopathy. This is maximal in right level II and II with conglomerate mass on the right level II station measuring 28 x 32 mm. Edema of the right greater than left vallecula, aryepiglottic fold, and vocal cords significantly narrows the airway at the level of the glottis. There is slight medialization of the right true vocal cord. Right recurrent laryngeal nerve palsy not excluded due to severe adenopathy.  Moderate mass effect on the right internal jugular without obstruction. Extensive mediastinal adenopathy  persists. Normal-appearing thyroid. Negative intracranial compartment. No osseous findings.  IMPRESSION: Extensive inflammatory reaction accompanying widespread right greater than left cervical adenopathy. Hodgkin's lymphoma is favored. Final pathology from previous mediastinal biopsy not yet available.  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 Slight medialization right true vocal cord could indicate recurrent laryngeal nerve palsy. ENT consultation for airway assessment may be warranted.   Electronically Signed   By: Davonna Belling M.D.   On: 08/12/2013 14:02   Ct Chest W Contrast  07/30/2013   CLINICAL DATA:  Mediastinal lymphadenopathy on chest radiograph  EXAM: CT CHEST WITH CONTRAST  TECHNIQUE: Multidetector CT imaging of the chest was performed during intravenous contrast administration.  CONTRAST:  75mL OMNIPAQUE IOHEXOL 300 MG/ML  SOLN  COMPARISON:  Chest radiographs dated 07/20/2013  FINDINGS: Minimal/trace right pleural fluid. Minimal dependent atelectasis. Lungs are otherwise clear. No pneumothorax.  Visualized thyroid is unremarkable.  Heart is normal in size. No pericardial  effusion.  5.5 x 13.7 cm soft tissue mass in the anterior/superior mediastinum (series 2/ image 20), suspicious for aggregate prevascular lymphadenopathy.  Additional lymphadenopathy includes:  --1.6 cm short axis right supraclavicular node (series 2/image 5)  --3.7 cm short axis right paratracheal node (series 2/ image 15)  --2.3 cm short axis left axillary node (series 2/ image 15)  --2.5 cm short axis left IMA node (series 2/ image 27)  Visualized upper abdomen is unremarkable. No visualized lymphadenopathy.  Visualized osseous structures are within normal limits.  IMPRESSION: 5.5 x 13.7 cm soft tissue mass in the anterior/superior mediastinum, corresponding to the radiographic abnormality, suspicious for aggregate prevascular lymphadenopathy related to (Hodgkin's) lymphoma.  Additional thoracic lymphadenopathy, as described above.  Consider biopsy or surgical resection of left axillary nodes for tissue confirmation. Additionally, PET-CT could be considered for further staging.   Electronically Signed   By: Charline Bills M.D.   On: 07/30/2013 10:10    Scheduled Meds: . dexamethasone  4 mg Intravenous Q4H  . enoxaparin (LOVENOX) injection  40 mg Subcutaneous Q24H   Continuous Infusions: . sodium chloride 50 mL/hr at 08/12/13 2049    Time spent: One hour with greater than 50% of the time spent coordinating care, education of patient and her family about current diagnosis and treatment plans.   LOS: 1 day   Dontray Haberland  [facility] Pager [phone].   *Please note that the hospitalists switch teams on Wednesdays. Please call the flow manager at [phone] if you are having difficulty reaching the hospitalist taking care of this patient as she can update you and provide the most up-to-date pager number of provider caring for the patient. If 8PM-8AM, please contact night-coverage at [email or web address], password [facility]  08/13/2013, 7:16 AM

## 2013-08-13 NOTE — Progress Notes (Signed)
Patient ID: Miranda Villegas, female   DOB: 10-Feb-1991, 22 y.o.   MRN: [id number] Request received for CT guided bone marrow biopsy on pt with recently diagnosed Hodgkin's lymphoma. Additional PMH as below. Exam: pt awake/alert; chest- CTA bilat; heart- RRR; abd- soft,+BS,NT; ext- FROM, no edema.   Filed Vitals:   08/12/13 2000 08/13/13 0000 08/13/13 0200 08/13/13 0400  BP:   133/77   Pulse:      Temp: 98.4 F (36.9 C) 98.2 F (36.8 C)  98.4 F (36.9 C)  TempSrc: Oral Oral  Oral  Resp:      SpO2:       Past Medical History  Diagnosis Date  . Mass in neck     right side   . Anxiety   . Depression     as a 22 year old  . Cancer 08/12/13    lymphoma recently dx   Past Surgical History  Procedure Laterality Date  . Tooth extraction    . Lymph node biopsy  08/09/13    medial neck area - lymphoma dx.   Dg Chest 2 View  08/12/2013   CLINICAL DATA:  Mediastinal mass biopsy 3 days ago. Short of breath and increased swelling of neck  EXAM: CHEST  2 VIEW  COMPARISON:  08/06/2013  FINDINGS: Large anterior mediastinal lymph node mass is unchanged allowing for AP projection.  Negative for infiltrate effusion. Negative for pneumothorax. If there is concern about SVC syndrome, consider CT chest with contrast.  IMPRESSION: Large anterior mediastinal mass is unchanged. No change from the prior chest x-ray.   Electronically Signed   By: Marlan Palau M.D.   On: 08/12/2013 13:42   Dg Chest 2 View Within Previous 72 Hours.  Films Obtained On Friday Are Acceptable For Monday And Tuesday Cases  08/06/2013   CLINICAL DATA:  Neck mass  EXAM: CHEST  2 VIEW  COMPARISON:  Chest x-ray dated July 20 2013. And chest CT scan dated July 30, 2013.  FINDINGS: There large paratracheal soft tissue masses obscuring the upper mediastinal heart borders. The trachea is not deviated. The mid and peripheral portions of both lungs appear clear. The cardiac silhouette is not enlarged. There is no pleural effusion. There is  no evidence of a pneumothorax.  1.  IMPRESSION: There are known large anterior and superior mediastinal masses. There is no evidence of a pneumothorax or pleural effusion.   Electronically Signed   By: David  [place]   On: 08/06/2013 11:21   Ct Soft Tissue Neck W Contrast  08/12/2013   CLINICAL DATA:  Lymphoma.  Recent biopsy.  Difficulty breathing.  EXAM: CT NECK WITH CONTRAST  TECHNIQUE: Multidetector CT imaging of the neck was performed using the standard protocol following the bolus administration of intravenous contrast.  CONTRAST:  80mL OMNIPAQUE IOHEXOL 300 MG/ML  SOLN  COMPARISON:  CT chest 07/30/2013.  FINDINGS: Extensive inflammatory reaction accompanies widespread right greater than left cervical lymphadenopathy. This is maximal in right level II and II with conglomerate mass on the right level II station measuring 28 x 32 mm. Edema of the right greater than left vallecula, aryepiglottic fold, and vocal cords significantly narrows the airway at the level of the glottis. There is slight medialization of the right true vocal cord. Right recurrent laryngeal nerve palsy not excluded due to severe adenopathy.  Moderate mass effect on the right internal jugular without obstruction. Extensive mediastinal adenopathy persists. Normal-appearing thyroid. Negative intracranial compartment. No osseous findings.  IMPRESSION: Extensive inflammatory reaction accompanying  widespread right greater than left cervical adenopathy. Hodgkin's lymphoma is favored. Final pathology from previous mediastinal biopsy not yet available.  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 Slight medialization right true vocal cord could indicate recurrent laryngeal nerve palsy. ENT consultation for airway assessment may be warranted.   Electronically Signed   By: Davonna Belling M.D.   On: 08/12/2013 14:02   Ct Chest W Contrast  07/30/2013    CLINICAL DATA:  Mediastinal lymphadenopathy on chest radiograph  EXAM: CT CHEST WITH CONTRAST  TECHNIQUE: Multidetector CT imaging of the chest was performed during intravenous contrast administration.  CONTRAST:  75mL OMNIPAQUE IOHEXOL 300 MG/ML  SOLN  COMPARISON:  Chest radiographs dated 07/20/2013  FINDINGS: Minimal/trace right pleural fluid. Minimal dependent atelectasis. Lungs are otherwise clear. No pneumothorax.  Visualized thyroid is unremarkable.  Heart is normal in size. No pericardial effusion.  5.5 x 13.7 cm soft tissue mass in the anterior/superior mediastinum (series 2/ image 20), suspicious for aggregate prevascular lymphadenopathy.  Additional lymphadenopathy includes:  --1.6 cm short axis right supraclavicular node (series 2/image 5)  --3.7 cm short axis right paratracheal node (series 2/ image 15)  --2.3 cm short axis left axillary node (series 2/ image 15)  --2.5 cm short axis left IMA node (series 2/ image 27)  Visualized upper abdomen is unremarkable. No visualized lymphadenopathy.  Visualized osseous structures are within normal limits.  IMPRESSION: 5.5 x 13.7 cm soft tissue mass in the anterior/superior mediastinum, corresponding to the radiographic abnormality, suspicious for aggregate prevascular lymphadenopathy related to (Hodgkin's) lymphoma.  Additional thoracic lymphadenopathy, as described above.  Consider biopsy or surgical resection of left axillary nodes for tissue confirmation. Additionally, PET-CT could be considered for further staging.   Electronically Signed   By: Charline Bills M.D.   On: 07/30/2013 10:10  , Results for orders placed during the hospital encounter of 08/12/13  CBC WITH DIFFERENTIAL      Result Value Range   WBC 9.5  4.0 - 10.5 K/uL   RBC 4.01  3.87 - 5.11 MIL/uL   Hemoglobin 9.1 (*) 12.0 - 15.0 g/dL   HCT 40.9 (*) 81.1 - 91.4 %   MCV 74.1 (*) 78.0 - 100.0 fL   MCH 22.7 (*) 26.0 - 34.0 pg   MCHC 30.6  30.0 - 36.0 g/dL   RDW 78.2 (*) 95.6 - 21.3 %    Platelets 376  150 - 400 K/uL   Neutrophils Relative % 86 (*) 43 - 77 %   Lymphocytes Relative 6 (*) 12 - 46 %   Monocytes Relative 7  3 - 12 %   Eosinophils Relative 1  0 - 5 %   Basophils Relative 0  0 - 1 %   Neutro Abs 8.1 (*) 1.7 - 7.7 K/uL   Lymphs Abs 0.6 (*) 0.7 - 4.0 K/uL   Monocytes Absolute 0.7  0.1 - 1.0 K/uL   Eosinophils Absolute 0.1  0.0 - 0.7 K/uL   Basophils Absolute 0.0  0.0 - 0.1 K/uL   Smear Review MORPHOLOGY UNREMARKABLE    BASIC METABOLIC PANEL      Result Value Range   Sodium 137  135 - 145 mEq/L   Potassium 4.0  3.5 - 5.1 mEq/L   Chloride 101  96 - 112 mEq/L   CO2 27  19 - 32 mEq/L   Glucose, Bld 88  70 - 99 mg/dL   BUN  10  6 - 23 mg/dL   Creatinine, Ser 1.61  0.50 - 1.10 mg/dL   Calcium 9.4  8.4 - 09.6 mg/dL   GFR calc non Af Amer >90  >90 mL/min   GFR calc Af Amer >90  >90 mL/min  VITAMIN B12      Result Value Range   Vitamin B-12 1849 (*) 211 - 911 pg/mL  FOLATE      Result Value Range   Folate 10.4    IRON AND TIBC      Result Value Range   Iron <10 (*) 42 - 135 ug/dL   TIBC Not calculated due to Iron <10.  250 - 470 ug/dL   Saturation Ratios Not calculated due to Iron <10.  20 - 55 %   UIBC 202  125 - 400 ug/dL  FERRITIN      Result Value Range   Ferritin 138  10 - 291 ng/mL  RETICULOCYTES      Result Value Range   Retic Ct Pct 1.0  0.4 - 3.1 %   RBC. 4.13  3.87 - 5.11 MIL/uL   Retic Count, Manual 41.3  19.0 - 186.0 K/uL  BASIC METABOLIC PANEL      Result Value Range   Sodium 136  135 - 145 mEq/L   Potassium 3.8  3.5 - 5.1 mEq/L   Chloride 101  96 - 112 mEq/L   CO2 25  19 - 32 mEq/L   Glucose, Bld 148 (*) 70 - 99 mg/dL   BUN 10  6 - 23 mg/dL   Creatinine, Ser 0.45  0.50 - 1.10 mg/dL   Calcium 9.2  8.4 - 40.9 mg/dL   GFR calc non Af Amer >90  >90 mL/min   GFR calc Af Amer >90  >90 mL/min  CBC      Result Value Range   WBC 7.7  4.0 - 10.5 K/uL   RBC 4.13  3.87 - 5.11 MIL/uL   Hemoglobin 9.3 (*) 12.0 - 15.0 g/dL   HCT 81.1 (*)  91.4 - 46.0 %   MCV 73.4 (*) 78.0 - 100.0 fL   MCH 22.5 (*) 26.0 - 34.0 pg   MCHC 30.7  30.0 - 36.0 g/dL   RDW 78.2  95.6 - 21.3 %   Platelets 416 (*) 150 - 400 K/uL   A/P: Pt with hx of recently diagnosed Hodgkin's lymphoma. Tent plan is for CT guided bone marrow biopsy on 11/24. Procedure could not be performed 11/21 secondary to back to back morning anesthesia cases involving IR physician/CT and window of bone marrow tech availability from 8am-12 noon only. Details/risks of procedure d/w pt/mother with their understanding and consent. If pt d/c'd home over weekend, please contact IR dept at [phone] or [phone] so appropriate preprocedure orders can be written.

## 2013-08-13 NOTE — Progress Notes (Signed)
Progress Note:  Subjective: Stable throughout the day. Rapid response to steroids with resolution of respiratory distress. PICC catheter placed this afternoon. Pulmonary functions and echocardiogram done. Normal ejection fraction 60-65%. Normal wall motion. I cannot find results of the pulmonary function test.     Vitals: Filed Vitals:   08/13/13 1340  BP: 134/59  Pulse: 112  Temp: 98.3 F (36.8 C)  Resp: 20   Wt Readings from Last 3 Encounters:  08/13/13 218 lb 11.1 oz (99.2 kg)  08/06/13 218 lb (98.884 kg)  08/04/13 215 lb (97.523 kg)     PHYSICAL EXAM:  General NAD Head: Normal Eyes: Normal Throat: No erythema or exudate Neck: Full range of motion Lymph Nodes: Bulky left supraclavicular and right anterior cervical lymphadenopathy. Left axillary adenopathy. All areas decreased per patient history compared with pre-steroid treatment. Lungs: Clear to auscultation resonant to percussion Breasts:  Cardiac: Abdominal: Soft, nontender, no mass, no organomegaly Extremities: No edema, no calf tenderness Vascular: Carotids 2+, no bruits, no cyanosis  Neurologic: Alert and oriented, mental status intact PERRLA, full extraocular movements, palate elevates symmetrically, cranial nerves grossly normal, motor strength 5 over 5, reflexes 1+ symmetric. Not tested at the left biceps to the presence of a intravenous catheter. Skin: No rash or ecchymosis  Labs:   Recent Labs  08/12/13 1255 08/13/13 0327  WBC 9.5 7.7  HGB 9.1* 9.3*  HCT 29.7* 30.3*  PLT 376 416*    Recent Labs  08/12/13 1255 08/13/13 0327  NA 137 136  K 4.0 3.8  CL 101 101  CO2 27 25  GLUCOSE 88 148*  BUN 10 10  CREATININE 0.72 0.58  CALCIUM 9.4 9.2      Images Studies/Results:   Dg Chest 2 View  08/12/2013   CLINICAL DATA:  Mediastinal mass biopsy 3 days ago. Short of breath and increased swelling of neck  EXAM: CHEST  2 VIEW  COMPARISON:  08/06/2013  FINDINGS: Large anterior mediastinal  lymph node mass is unchanged allowing for AP projection.  Negative for infiltrate effusion. Negative for pneumothorax. If there is concern about SVC syndrome, consider CT chest with contrast.  IMPRESSION: Large anterior mediastinal mass is unchanged. No change from the prior chest x-ray.   Electronically Signed   By: Marlan Palau M.D.   On: 08/12/2013 13:42   Ct Soft Tissue Neck W Contrast  08/12/2013   CLINICAL DATA:  Lymphoma.  Recent biopsy.  Difficulty breathing.  EXAM: CT NECK WITH CONTRAST  TECHNIQUE: Multidetector CT imaging of the neck was performed using the standard protocol following the bolus administration of intravenous contrast.  CONTRAST:  80mL OMNIPAQUE IOHEXOL 300 MG/ML  SOLN  COMPARISON:  CT chest 07/30/2013.  FINDINGS: Extensive inflammatory reaction accompanies widespread right greater than left cervical lymphadenopathy. This is maximal in right level II and II with conglomerate mass on the right level II station measuring 28 x 32 mm. Edema of the right greater than left vallecula, aryepiglottic fold, and vocal cords significantly narrows the airway at the level of the glottis. There is slight medialization of the right true vocal cord. Right recurrent laryngeal nerve palsy not excluded due to severe adenopathy.  Moderate mass effect on the right internal jugular without obstruction. Extensive mediastinal adenopathy persists. Normal-appearing thyroid. Negative intracranial compartment. No osseous findings.  IMPRESSION: Extensive inflammatory reaction accompanying widespread right greater than left cervical adenopathy. Hodgkin's lymphoma is favored. Final pathology from previous mediastinal biopsy not yet available.  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 Slight medialization right true vocal cord could indicate recurrent laryngeal nerve palsy. ENT consultation for airway assessment  may be warranted.   Electronically Signed   By: Davonna Belling M.D.   On: 08/12/2013 14:02   Ir US Guide Vasc Access Right  08/13/2013   CLINICAL DATA:  Hodgkin's lymphoma, poor venous access; central venous access is requested for fluids and medications.  EXAM: Right upper extremity power PICC LINE PLACEMENT WITH ULTRASOUND AND FLUOROSCOPIC GUIDANCE  FLUOROSCOPY TIME:  42 seconds  PROCEDURE: The patient was advised of the possible risks and complications and agreed to undergo the procedure. The patient was then brought to the angiographic suite for the procedure.  The right arm was prepped with chlorhexidine, draped in the usual sterile fashion using maximum barrier technique (cap and mask, sterile gown, sterile gloves, large sterile sheet, hand hygiene and cutaneous antisepsis) and infiltrated locally with 1% Lidocaine.  Ultrasound demonstrated patency of the right basilic vein, and this was documented with an image. Under real-time ultrasound guidance, this vein was accessed with a 21 gauge micropuncture needle and image documentation was performed. A 0.018 wire was introduced in to the vein. Over this, a 5 [place] double lumen 41 cm power injectable PICC was advanced to the lower SVC/right atrial junction. Fluoroscopy during the procedure and fluoro spot radiograph confirms appropriate catheter position. The catheter was flushed and covered with a sterile dressing.  Complications: None  IMPRESSION: Successful right arm power PICC line placement with ultrasound and fluoroscopic guidance. The catheter is ready for use  Read by: Jeananne Rama ,P.A.-C.   Electronically Signed   By: Irish Lack M.D.   On: 08/13/2013 16:43   Ir Fluoro Guide Cv Midline Picc Right  08/13/2013   CLINICAL DATA:  Hodgkin's lymphoma, poor venous access; central venous access is requested for fluids and medications.  EXAM: Right upper extremity power PICC LINE PLACEMENT WITH ULTRASOUND AND FLUOROSCOPIC GUIDANCE  FLUOROSCOPY TIME:  42  seconds  PROCEDURE: The patient was advised of the possible risks and complications and agreed to undergo the procedure. The patient was then brought to the angiographic suite for the procedure.  The right arm was prepped with chlorhexidine, draped in the usual sterile fashion using maximum barrier technique (cap and mask, sterile gown, sterile gloves, large sterile sheet, hand hygiene and cutaneous antisepsis) and infiltrated locally with 1% Lidocaine.  Ultrasound demonstrated patency of the right basilic vein, and this was documented with an image. Under real-time ultrasound guidance, this vein was accessed with a 21 gauge micropuncture needle and image documentation was performed. A 0.018 wire was introduced in to the vein. Over this, a 5 [place] double lumen 41 cm power injectable PICC was advanced to the lower SVC/right atrial junction. Fluoroscopy during the procedure and fluoro spot radiograph confirms appropriate catheter position. The catheter was flushed and covered with a sterile dressing.  Complications: None  IMPRESSION: Successful right arm power PICC line placement with ultrasound and fluoroscopic guidance. The catheter is ready for use  Read by: Jeananne Rama ,P.A.-C.   Electronically Signed   By: Irish Lack M.D.   On: 08/13/2013 16:43     Patient Active Problem List   Diagnosis Date Noted  . Hodgkin's disease with dyspnea secondary to lymphadenopathy and inflammatory reaction status post mediastinoscopy 08/13/2013  . Dyspnea 08/12/2013  . Microcytic anemia 08/12/2013  . Hodgkin's lymphoma, nodular sclerosis 08/12/2013  . Mediastinal lymphadenopathy  08/04/2013    Assessment and Plan:  Clinical stage IIB classical Hodgkin's lymphoma Respiratory distress secondary to bulky cervical and anterior mediastinal lymphadenopathy partially resolved with steroid treatment  Overview: 22 year old woman who first noticed left axillary adenopathy in June of this year. She was treated with  antibiotics. She began to notice right cervical adenopathy in August. She received antibiotics again. Over this interval she began to develop night sweats, temperatures over 102, an approximate 15 pound weight loss in 3 months. More recently, she developed progressive dysphagia and respiratory distress. She is found to have a large mediastinal mass on chest radiograph which was confirmed by CT scan done on November 7 which showed a large anterior mediastinal mass measuring 5.5 x 13.7 cm with additional lymph nodes in the right supraclavicular, right paratracheal, left axillary, and a "left IMA node". Mediastinoscopy with biopsy done November 17 diagnostic for Hodgkin's lymphoma.  Diagnosis, prognosis, and treatment options discussed with the patient and both of her parents in detail. Overview of combined modality treatment for Hodgkin's disease reviewed. Potential complications of chemotherapy including decreased fertility, low blood counts with attendant risk for infection, anemia, and bleeding, and individual chemotherapy side effects related to Adriamycin, bleomycin, vinblastine, and DTIC reviewed.  Patient agrees to proceed with chemotherapy as outlined. Inpatient PET scan declined AGAINST MEDICAL ADVICE by radiology and insurance. We do not have the luxury to do this as an outpatient in a  patient that needs immediate treatment. I'm going to get a CT scan of the abdomen and pelvis and proceed with the first cycle of chemotherapy with ABVD tomorrow. She was started on allopurinol and will be given intravenous hydration. Monitor for tumor lysis syndrome. I am Going to cancel bone marrow biopsy. Schedule a PET scan for after discharge when she is otherwise stable.  Time spent 90 minutes not including entry of chemotherapy care plan       Chany Woolworth M 08/13/2013, 7:14 PM

## 2013-08-13 NOTE — Progress Notes (Signed)
Nutrition Brief Note  Patient identified on the Malnutrition Screening Tool (MST) Report  Wt Readings from Last 15 Encounters:  08/06/13 218 lb (98.884 kg)  08/04/13 215 lb (97.523 kg)    Based on weight 08/04/13, pt has a Body Mass Index of 31.7 kg/(m^2). Patient meets criteria for Obesity based on current BMI. Pt reports losing 15 lbs in the past 3 months- 6.5 % weight loss, not significant.   Pt states that PTA her appetite varied and at times she would skip breakfast. Encouraged pt to eat 3 balanced meals daily; encouraged all food groups daily. Encouraged pt to monitor weight and prevent rapid weight loss during cancer treatment. Current diet order is Regular, patient is consuming approximately 100% of meals at this time and states her appetite is great. Pt's mother has been bringing pt food from home.   Labs and medications reviewed. Pt has no questions or concerns.   No nutrition interventions warranted at this time. If nutrition issues arise, please consult RD.   Ian Malkin RD, LDN Inpatient Clinical Dietitian Pager: [phone] After Hours Pager: [phone]

## 2013-08-13 NOTE — Progress Notes (Signed)
Peripherally Inserted Central Catheter/Midline Placement  The IV Nurse has discussed with the patient and/or persons authorized to consent for the patient, the purpose of this procedure and the potential benefits and risks involved with this procedure.  The benefits include less needle sticks, lab draws from the catheter and patient may be discharged home with the catheter.  Risks include, but not limited to, infection, bleeding, blood clot (thrombus formation), and puncture of an artery; nerve damage and irregular heat beat.  Alternatives to this procedure were also discussed.  PICC/Midline Placement Documentation     Attempted PICC x 2 Left arm.  Unable to thread PICC centrally meeting some type of blockage.  Veins too small for bedside PICC Rt arm.  PICC would occupy over 100% of Rt cephalic and 65% of Rt Basilic.  Recommend Pt go to IR if PICC still desired.     Ethelda Chick 08/13/2013, 3:12 PM

## 2013-08-14 ENCOUNTER — Other Ambulatory Visit: Payer: Self-pay | Admitting: Oncology

## 2013-08-14 DIAGNOSIS — Z5111 Encounter for antineoplastic chemotherapy: Secondary | ICD-10-CM

## 2013-08-14 DIAGNOSIS — C8190 Hodgkin lymphoma, unspecified, unspecified site: Secondary | ICD-10-CM

## 2013-08-14 LAB — HEPATITIS PANEL, ACUTE
HCV Ab: NEGATIVE
Hep A IgM: NONREACTIVE
Hep B C IgM: NONREACTIVE
Hepatitis B Surface Ag: NEGATIVE

## 2013-08-14 LAB — CMV IGM: CMV IgM: <8 AU/mL (ref <30.00)

## 2013-08-14 LAB — EPSTEIN-BARR VIRUS VCA ANTIBODY PANEL
EBV EA IgG: <5 U/mL (ref <9.0)
EBV NA IgG: 43.8 U/mL — ABNORMAL HIGH (ref <18.0)
EBV VCA IgG: 567 U/mL — ABNORMAL HIGH (ref <18.0)
EBV VCA IgM: <10 U/mL (ref <36.0)

## 2013-08-14 MED ORDER — SODIUM CHLORIDE 0.9 % IV SOLN
375.0000 mg/m2 | Freq: Once | INTRAVENOUS | Status: AC
  Administered 2013-08-14: 820 mg via INTRAVENOUS
  Filled 2013-08-14: qty 41

## 2013-08-14 MED ORDER — SODIUM CHLORIDE 0.9 % IV SOLN
Freq: Once | INTRAVENOUS | Status: AC
  Administered 2013-08-14: 16 mg via INTRAVENOUS
  Filled 2013-08-14: qty 8

## 2013-08-14 MED ORDER — BLEOMYCIN SULFATE CHEMO INJECTION 30 UNIT
10.0000 [IU]/m2 | Freq: Once | INTRAMUSCULAR | Status: AC
  Administered 2013-08-14: 22 [IU] via INTRAVENOUS
  Filled 2013-08-14: qty 7.33

## 2013-08-14 MED ORDER — SODIUM CHLORIDE 0.9 % IJ SOLN: 3.0000 mL | INTRAMUSCULAR | Status: DC | PRN

## 2013-08-14 MED ORDER — DOXORUBICIN HCL CHEMO IV INJECTION 2 MG/ML
25.0000 mg/m2 | Freq: Once | INTRAVENOUS | Status: AC
  Administered 2013-08-14: 56 mg via INTRAVENOUS
  Filled 2013-08-14: qty 28

## 2013-08-14 MED ORDER — ALTEPLASE 2 MG IJ SOLR
2.0000 mg | Freq: Once | INTRAMUSCULAR | Status: AC | PRN
  Filled 2013-08-14: qty 2

## 2013-08-14 MED ORDER — HEPARIN SOD (PORK) LOCK FLUSH 100 UNIT/ML IV SOLN: 500.0000 [IU] | Freq: Once | INTRAVENOUS | Status: AC | PRN

## 2013-08-14 MED ORDER — SODIUM CHLORIDE 0.9 % IV SOLN
6.0000 mg/m2 | Freq: Once | INTRAVENOUS | Status: AC
  Administered 2013-08-14: 13.2 mg via INTRAVENOUS
  Filled 2013-08-14: qty 13.2

## 2013-08-14 MED ORDER — SODIUM CHLORIDE 0.9 % IV SOLN: Freq: Once | INTRAVENOUS | Status: DC

## 2013-08-14 MED ORDER — HEPARIN SOD (PORK) LOCK FLUSH 100 UNIT/ML IV SOLN: 250.0000 [IU] | Freq: Once | INTRAVENOUS | Status: AC | PRN

## 2013-08-14 MED ORDER — SODIUM CHLORIDE 0.9 % IJ SOLN: 10.0000 mL | INTRAMUSCULAR | Status: DC | PRN

## 2013-08-14 NOTE — Progress Notes (Signed)
[facility] PROGRESS NOTE   Miranda Villegas ZOX:[id number] DOB: 11-23-1990 DOA: 08/12/2013 PCP: Levert Feinstein, MD  Brief narrative: Miranda Villegas is an 22 y.o. female with recently diagnosed, biopsy confirmed Hodgkin's lymphoma, status post mediastinoscopy/biopsy on 08/09/2013, who was admitted on 08/12/2013 with dyspnea.  A CT scan of the neck showed extensive inflammatory reaction accompanying widespread right greater than left cervical adenopathy. ENT and CVTS consultations were requested. She was placed on empiric steroids with improvement.  Assessment/Plan: Principal Problem:   Hodgkin's disease with dyspnea secondary to lymphadenopathy and inflammatory reaction status post mediastinoscopy The patient was admitted to the step down unit and placed on IV Decadron with rapid resolution of her respiratory distress. Evaluated by Dr. Jenne Pane ENT on 08/12/2013. Weaning decadron. Completed staging evaluation today with a CT scan of the abdomen and pelvis, but bone marrow biopsy unable to be scheduled.  2-D echocardiogram showed normal ejection fraction and pulmonary function tests done 08/13/2013. Plan is to start chemotherapy today. Continue allopurinol and watch for tumor lysis. PICC line placed 08/13/2013. Active Problems:   MRSA nasal colonization Continue decontamination therapy with intranasal Bactroban and chlorhexidine scrubs.   Microcytic / iron deficiency anemia Likely secondary to iron deficiency from menstrual losses.    Code Status: Full. Family Communication: Mother updated at bedside. Disposition Plan: Home when stable.   IV access:  Peripheral IV  Medical Consultants:  Dr. Christia Reading, ENT  Dr. Cephas Darby, Oncology  Other Consultants:  None.  Anti-infectives:  None.  HPI/Subjective: Miranda Villegas has no complaints of dyspnea today. No cough. No fevers.  Appetite good  No N/V.  Objective: Filed Vitals:   08/13/13 0905 08/13/13 1340 08/13/13  2230 08/14/13 0749  BP: 137/75 134/59 127/67 113/89  Pulse: 101 112 88 64  Temp:  98.3 F (36.8 C) 98.1 F (36.7 C) 97.7 F (36.5 C)  TempSrc:  Oral Oral Oral  Resp: 21 20 20 20   Height: 5\' 9"  (1.753 m)     Weight: 99.2 kg (218 lb 11.1 oz)     SpO2: 98% 100% 98% 100%    Intake/Output Summary (Last 24 hours) at 08/14/13 0806 Last data filed at 08/14/13 0700  Gross per 24 hour  Intake   1640 ml  Output   2650 ml  Net  -1010 ml    Exam: Gen:  NAD Cardiovascular:  RRR, No M/R/G Respiratory:  Lungs CTAB Gastrointestinal:  Abdomen soft, NT/ND, + BS Extremities:  No C/E/C  Data Reviewed: asic Metabolic Panel:  Recent Labs Lab 08/12/13 1255 08/13/13 0327  NA 137 136  K 4.0 3.8  CL 101 101  CO2 27 25  GLUCOSE 88 148*  BUN 10 10  CREATININE 0.72 0.58  CALCIUM 9.4 9.2   GFR Estimated Creatinine Clearance: 140.6 ml/min (by C-G formula based on Cr of 0.58).  CBC:  Recent Labs Lab 08/12/13 1255 08/13/13 0327  WBC 9.5 7.7  NEUTROABS 8.1*  --   HGB 9.1* 9.3*  HCT 29.7* 30.3*  MCV 74.1* 73.4*  PLT 376 416*   Anemia work up  Recent Labs  08/12/13 1255  VITAMINB12 1849*  FOLATE 10.4  FERRITIN 138  TIBC Not calculated due to Iron <10.  IRON <10*  RETICCTPCT 1.0   Microbiology Recent Results (from the past 240 hour(s))  SURGICAL PCR SCREEN     Status: Abnormal   Collection Time    08/06/13  9:44 AM      Result Value Range Status   MRSA, PCR NEGATIVE  NEGATIVE Final   Staphylococcus aureus POSITIVE (*) NEGATIVE Final   Comment:            The Xpert SA Assay (FDA     approved for NASAL specimens     in patients over 28 years of age),     is one component of     a comprehensive surveillance     program.  Test performance has     been validated by [employer] for patients greater     than or equal to 20 year old.     It is not intended     to diagnose infection nor to     guide or monitor treatment.     Procedures and Diagnostic Studies: Dg  Chest 2 View  08/12/2013   CLINICAL DATA:  Mediastinal mass biopsy 3 days ago. Short of breath and increased swelling of neck  EXAM: CHEST  2 VIEW  COMPARISON:  08/06/2013  FINDINGS: Large anterior mediastinal lymph node mass is unchanged allowing for AP projection.  Negative for infiltrate effusion. Negative for pneumothorax. If there is concern about SVC syndrome, consider CT chest with contrast.  IMPRESSION: Large anterior mediastinal mass is unchanged. No change from the prior chest x-ray.   Electronically Signed   By: Marlan Palau M.D.   On: 08/12/2013 13:42   Dg Chest 2 View Within Previous 72 Hours.  Films Obtained On Friday Are Acceptable For Monday And Tuesday Cases  08/06/2013   CLINICAL DATA:  Neck mass  EXAM: CHEST  2 VIEW  COMPARISON:  Chest x-ray dated July 20 2013. And chest CT scan dated July 30, 2013.  FINDINGS: There large paratracheal soft tissue masses obscuring the upper mediastinal heart borders. The trachea is not deviated. The mid and peripheral portions of both lungs appear clear. The cardiac silhouette is not enlarged. There is no pleural effusion. There is no evidence of a pneumothorax.  1.  IMPRESSION: There are known large anterior and superior mediastinal masses. There is no evidence of a pneumothorax or pleural effusion.   Electronically Signed   By: David  [place]   On: 08/06/2013 11:21   Ct Soft Tissue Neck W Contrast  08/12/2013   CLINICAL DATA:  Lymphoma.  Recent biopsy.  Difficulty breathing.  EXAM: CT NECK WITH CONTRAST  TECHNIQUE: Multidetector CT imaging of the neck was performed using the standard protocol following the bolus administration of intravenous contrast.  CONTRAST:  80mL OMNIPAQUE IOHEXOL 300 MG/ML  SOLN  COMPARISON:  CT chest 07/30/2013.  FINDINGS: Extensive inflammatory reaction accompanies widespread right greater than left cervical lymphadenopathy. This is maximal in right level II and II with conglomerate mass on the right level II station  measuring 28 x 32 mm. Edema of the right greater than left vallecula, aryepiglottic fold, and vocal cords significantly narrows the airway at the level of the glottis. There is slight medialization of the right true vocal cord. Right recurrent laryngeal nerve palsy not excluded due to severe adenopathy.  Moderate mass effect on the right internal jugular without obstruction. Extensive mediastinal adenopathy persists. Normal-appearing thyroid. Negative intracranial compartment. No osseous findings.  IMPRESSION: Extensive inflammatory reaction accompanying widespread right greater than left cervical adenopathy. Hodgkin's lymphoma is favored. Final pathology from previous mediastinal biopsy not yet available.  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 Slight medialization right true vocal cord could indicate recurrent laryngeal  nerve palsy. ENT consultation for airway assessment may be warranted.   Electronically Signed   By: Davonna Belling M.D.   On: 08/12/2013 14:02   Ct Chest W Contrast  07/30/2013   CLINICAL DATA:  Mediastinal lymphadenopathy on chest radiograph  EXAM: CT CHEST WITH CONTRAST  TECHNIQUE: Multidetector CT imaging of the chest was performed during intravenous contrast administration.  CONTRAST:  75mL OMNIPAQUE IOHEXOL 300 MG/ML  SOLN  COMPARISON:  Chest radiographs dated 07/20/2013  FINDINGS: Minimal/trace right pleural fluid. Minimal dependent atelectasis. Lungs are otherwise clear. No pneumothorax.  Visualized thyroid is unremarkable.  Heart is normal in size. No pericardial effusion.  5.5 x 13.7 cm soft tissue mass in the anterior/superior mediastinum (series 2/ image 20), suspicious for aggregate prevascular lymphadenopathy.  Additional lymphadenopathy includes:  --1.6 cm short axis right supraclavicular node (series 2/image 5)  --3.7 cm short axis right paratracheal node (series 2/ image 15)   --2.3 cm short axis left axillary node (series 2/ image 15)  --2.5 cm short axis left IMA node (series 2/ image 27)  Visualized upper abdomen is unremarkable. No visualized lymphadenopathy.  Visualized osseous structures are within normal limits.  IMPRESSION: 5.5 x 13.7 cm soft tissue mass in the anterior/superior mediastinum, corresponding to the radiographic abnormality, suspicious for aggregate prevascular lymphadenopathy related to (Hodgkin's) lymphoma.  Additional thoracic lymphadenopathy, as described above.  Consider biopsy or surgical resection of left axillary nodes for tissue confirmation. Additionally, PET-CT could be considered for further staging.   Electronically Signed   By: Charline Bills M.D.   On: 07/30/2013 10:10    Scheduled Meds: . allopurinol  300 mg Oral Daily  . Chlorhexidine Gluconate Cloth  6 each Topical Q0600  . dexamethasone  4 mg Intravenous Q12H  . enoxaparin (LOVENOX) injection  40 mg Subcutaneous Q24H  . mupirocin ointment  1 application Nasal BID   Continuous Infusions: . sodium chloride 50 mL/hr at 08/13/13 1318  . sodium chloride 125 mL/hr at 08/13/13 1520    Time spent: 35 minutes with > 50% of time discussing current diagnostic test results, clinical impression and plan of care. .   LOS: 2 days   Miranda Villegas  [facility] Pager [phone].   *Please note that the hospitalists switch teams on Wednesdays. Please call the flow manager at [phone] if you are having difficulty reaching the hospitalist taking care of this patient as she can update you and provide the most up-to-date pager number of provider caring for the patient. If 8PM-8AM, please contact night-coverage at [email or web address], password [facility]  08/14/2013, 8:06 AM

## 2013-08-14 NOTE — Progress Notes (Signed)
Stable overnight. Breathing comfortably at rest. Lungs clear. Oropharynx no erythema or exudate. Regular cardiac rhythm. Extremities no edema. No calf tenderness. CT scan abdomen and pelvis shows 2 lesions in the spleen. No adenopathy. This increases her up to a stage III. Pulmonary function test are not available in the computer. Uric acid is normal. LDH elevated at 507. Impression: Stage IIIB Hodgkin's lymphoma Plan to proceed with the first dose of ABVD chemotherapy today. Chemotherapy will be the mainstay of treatment. She will require 6 cycles (1 treatment every 2 weeks for a total of 12 treatments.) Recommendation:   hydration overnight and if stable she could potentially be discharged tomorrow. Continue allopurinol 300 mg daily for 30 days then stop Zofran ODT 8 mg sublingual every 6 hours when necessary for leg nausea vomiting. It may be worthwhile to have her take this every 8 hours for the next 48 hours after the treatment. Lorazepam 2 mg was over sedating. I would send her home on 1 mg every 4 hours when necessary anxiety nausea her sleep. We will need to get advanced care involved for education and maintenance of her PICC line. Anticipate we may then change her to a Port-A-Cath which is otherwise stable. Please cancel bone marrow biopsy. Schedule outpatient PET scan

## 2013-08-15 ENCOUNTER — Other Ambulatory Visit: Payer: Self-pay | Admitting: Oncology

## 2013-08-15 DIAGNOSIS — C8110 Nodular sclerosis classical Hodgkin lymphoma, unspecified site: Secondary | ICD-10-CM

## 2013-08-15 DIAGNOSIS — R590 Localized enlarged lymph nodes: Secondary | ICD-10-CM

## 2013-08-15 LAB — COMPREHENSIVE METABOLIC PANEL
ALT: 16 U/L (ref 0–35)
Alkaline Phosphatase: 85 U/L (ref 39–117)
CO2: 26 mEq/L (ref 19–32)
Chloride: 101 mEq/L (ref 96–112)
Creatinine, Ser: 0.59 mg/dL (ref 0.50–1.10)
GFR calc Af Amer: >90 mL/min (ref >90)
Glucose, Bld: 123 mg/dL — ABNORMAL HIGH (ref 70–99)
Potassium: 4.1 mEq/L (ref 3.5–5.1)
Sodium: 136 mEq/L (ref 135–145)
Total Bilirubin: 0.1 mg/dL — ABNORMAL LOW (ref 0.3–1.2)
Total Protein: 6.7 g/dL (ref 6.0–8.3)

## 2013-08-15 LAB — CBC
HCT: 30.6 % — ABNORMAL LOW (ref 36.0–46.0)
MCHC: 30.4 g/dL (ref 30.0–36.0)
MCV: 73.7 fL — ABNORMAL LOW (ref 78.0–100.0)
Platelets: 353 10*3/uL (ref 150–400)
RBC: 4.15 MIL/uL (ref 3.87–5.11)
RDW: 15.4 % (ref 11.5–15.5)

## 2013-08-15 LAB — URIC ACID: Uric Acid, Serum: 3.2 mg/dL (ref 2.4–7.0)

## 2013-08-15 LAB — PHOSPHORUS: Phosphorus: 5.1 mg/dL — ABNORMAL HIGH (ref 2.3–4.6)

## 2013-08-15 MED ORDER — LORAZEPAM 1 MG PO TABS: 1.0000 mg | ORAL_TABLET | Freq: Four times a day (QID) | ORAL | Status: DC | PRN

## 2013-08-15 MED ORDER — PROMETHAZINE HCL 25 MG PO TABS: 25.0000 mg | ORAL_TABLET | Freq: Four times a day (QID) | ORAL | Status: DC | PRN

## 2013-08-15 MED ORDER — OXYCODONE HCL 5 MG PO CAPS: 5.0000 mg | ORAL_CAPSULE | ORAL | Status: DC | PRN

## 2013-08-15 MED ORDER — ONDANSETRON 8 MG PO TBDP: 8.0000 mg | ORAL_TABLET | Freq: Three times a day (TID) | ORAL | Status: DC | PRN

## 2013-08-15 MED ORDER — HEPARIN SOD (PORK) LOCK FLUSH 100 UNIT/ML IV SOLN
250.0000 [IU] | INTRAVENOUS | Status: DC | PRN
  Filled 2013-08-15: qty 5

## 2013-08-15 MED ORDER — ALLOPURINOL 300 MG PO TABS: 300.0000 mg | ORAL_TABLET | Freq: Every day | ORAL | Status: DC

## 2013-08-15 NOTE — Discharge Summary (Addendum)
Physician Discharge Summary  Miranda Villegas NWG:[id number] DOB: 1991-01-18 DOA: 08/12/2013  PCP: Levert Feinstein, MD  Admit date: 08/12/2013 Discharge date: 08/15/2013  Recommendations for Outpatient Follow-up:  1. Home health RN arranged to teach patient how to care for PICC line.  Discharge Diagnoses:  Principal Problem:   Hodgkin's disease with dyspnea secondary to lymphadenopathy and inflammatory reaction status post mediastinoscopy Active Problems:    Dyspnea    Microcytic anemia    Encounter for antineoplastic chemotherapy    Staph Aureus nasal colonization   Discharge Condition: Stable.  Diet recommendation: Regular.  History of present illness:  Miranda Villegas is an 22 y.o. female with recently diagnosed, biopsy confirmed Hodgkin's lymphoma, status post mediastinoscopy/biopsy on 08/09/2013, who was admitted on 08/12/2013 with dyspnea. A CT scan of the neck showed extensive inflammatory reaction accompanying widespread right greater than left cervical adenopathy. ENT and CVTS consultations were requested. She was placed on empiric steroids with improvement. During the course of her hospital stay, she has begun treatment for stage IIIB Hodgkin's disease.  Hospital Course by problem:  Principal Problem:  Stage IIIB Hodgkin's disease with dyspnea secondary to lymphadenopathy and inflammatory reaction status post mediastinoscopy  The patient was admitted to the step down unit and placed on IV Decadron with rapid resolution of her respiratory distress. Evaluated by Dr. Jenne Pane ENT on 08/12/2013. Completed staging evaluation with a CT scan of the abdomen and pelvis, which showed some splenic involvement, but bone marrow biopsy unable to be scheduled. 2-D echocardiogram showed normal ejection fraction and pulmonary function tests done 08/13/2013. Underwent first cycle of ABVD chemotherapy 08/14/2013 and tolerated this with some mild nausea. Continue allopurinol given high risk for  tumor lysis. PICC line placed 08/13/2013. We'll discharge home with PICC line and set up home health nurse for teaching the patient had a care for the line. Active Problems:  Staph Aureus nasal colonization  Continue decontamination therapy with intranasal Bactroban and chlorhexidine scrubs.  Microcytic / iron deficiency anemia  Likely secondary to iron deficiency from menstrual losses.   Procedures:  PICC line placed 08/13/13  2-D echocardiogram 08/13/2013: EF 60-65%.  Pulmonary function test 08/13/2013  Consultations:  Dr. Cephas Darby, Oncology  Dr. Christia Reading, ENT  Discharge Exam: Filed Vitals:   08/15/13 0633  BP: 131/73  Pulse: 61  Temp: 98.1 F (36.7 C)  Resp: 18   Filed Vitals:   08/14/13 0749 08/14/13 1405 08/14/13 2103 08/15/13 0633  BP: 113/89 128/75 106/59 131/73  Pulse: 64 87 58 61  Temp: 97.7 F (36.5 C) 98 F (36.7 C) 98.4 F (36.9 C) 98.1 F (36.7 C)  TempSrc: Oral Oral Oral Oral  Resp: 20 18 16 18   Height:      Weight:      SpO2: 100% 100% 97% 98%    Gen:  NAD Cardiovascular:  RRR, No M/R/G Respiratory: Lungs CTAB Gastrointestinal: Abdomen soft, NT/ND with normal active bowel sounds. Extremities: No C/E/C   Discharge Instructions      Discharge Orders   Future Appointments Provider Department Dept Phone   08/16/2013 7:00 AM [facility] [phone]   08/17/2013 11:00 AM Loreli Slot, MD [facility] [phone]   08/30/2013 9:00 AM [facility] [phone]   Pt should arrive15 minutes prior to scheduled appt time. Please inform patient that exam will take a minimum of 1 1/2 hours. Patient to be NPO 6 hours prior to exam  and should not take any insulin the day of exam.   Future Orders Complete By Expires   Call MD for:  persistant nausea and vomiting  As directed    Call MD for:  severe uncontrolled pain   As directed    Call MD for:  temperature >100.4  As directed    Diet general  As directed    Discharge instructions  As directed    Comments:     No working until you are cleared to return to work by Dr. Cyndie Chime.  Ask your employer for FMLA forms if you will be out of work for an extended period of time.  The FMLA entitles eligible employees of covered employers to take unpaid, job-protected leave for specified family and medical reasons with continuation of group health insurance coverage under the same terms and conditions as if the employee had not taken leave. Eligible employees are entitled to:  Twelve work weeks of leave in a 5-month period for a serious health condition that makes the employee unable to perform the essential functions of his or her job.  It is very important that you maintain your fluid intake. If you develop intractable nausea/vomiting and are unable to maintain fluid intake, call Dr. Patsy Lager office.  Take Zofran every 8 hours for the next 48 hours and then you can take it as needed. Use Phenergan as needed for breakthrough nausea in between the doses of Zofran.  You were cared for by Dr. Hillery Aldo  (a hospitalist) during your hospital stay. If you have any questions about your discharge medications or the care you received while you were in the hospital after you are discharged, you can call the unit and ask to speak with the hospitalist on call if the hospitalist that took care of you is not available. Once you are discharged, your primary care physician will handle any further medical issues. Please note that NO REFILLS for any discharge medications will be authorized once you are discharged, as it is imperative that you return to your primary care physician (or establish a relationship with a primary care physician if you do not have one) for your aftercare needs so that they can reassess your need for medications and monitor your lab values.  Any  outstanding tests can be reviewed by your PCP at your follow up visit.  It is also important to review any medicine changes with your PCP.  Please bring these d/c instructions with you to your next visit so your physician can review these changes with you.  If you do not have a primary care physician, you can call [phone] for a physician referral.  It is highly recommended that you obtain a PCP for hospital follow up.   Home Health  As directed    Scheduling Instructions:     Please teach patient how to care for PICC line.   Questions:     To provide the following care/treatments:  RN   Increase activity slowly  As directed        Medication List         allopurinol 300 MG tablet  Commonly known as:  ZYLOPRIM  Take 1 tablet (300 mg total) by mouth daily.     ibuprofen 200 MG tablet  Commonly known as:  ADVIL,MOTRIN  Take 400-600 mg by mouth every 6 (six) hours as needed for fever, headache, mild pain, moderate pain or cramping.     loratadine 10 MG tablet  Commonly known  as:  CLARITIN  Take 10 mg by mouth daily as needed for allergies.     LORazepam 1 MG tablet  Commonly known as:  ATIVAN  Take 1 tablet (1 mg total) by mouth every 6 (six) hours as needed for anxiety or sleep.     ondansetron 8 MG disintegrating tablet  Commonly known as:  ZOFRAN ODT  Take 1 tablet (8 mg total) by mouth every 8 (eight) hours as needed for nausea or vomiting (Take every 8 hours for the next 48 hours, then as needed.).     oxycodone 5 MG capsule  Commonly known as:  OXY-IR  Take 1 capsule (5 mg total) by mouth every 4 (four) hours as needed.     promethazine 25 MG tablet  Commonly known as:  PHENERGAN  Take 1 tablet (25 mg total) by mouth every 6 (six) hours as needed for refractory nausea / vomiting.     triamcinolone cream 0.1 %  Commonly known as:  KENALOG  Apply 1 application topically daily as needed (for eczema flare ups).       Follow-up Information   Follow up with  Levert Feinstein, MD. Schedule an appointment as soon as possible for a visit in 1 week. (Call Monday for an appt.)    Specialty:  Oncology   Contact information:   [address] [place] [address] [phone]        The results of significant diagnostics from this hospitalization (including imaging, microbiology, ancillary and laboratory) are listed below for reference.    Significant Diagnostic Studies: Dg Chest 2 View  08/12/2013   CLINICAL DATA:  Mediastinal mass biopsy 3 days ago. Short of breath and increased swelling of neck  EXAM: CHEST  2 VIEW  COMPARISON:  08/06/2013  FINDINGS: Large anterior mediastinal lymph node mass is unchanged allowing for AP projection.  Negative for infiltrate effusion. Negative for pneumothorax. If there is concern about SVC syndrome, consider CT chest with contrast.  IMPRESSION: Large anterior mediastinal mass is unchanged. No change from the prior chest x-ray.   Electronically Signed   By: Marlan Palau M.D.   On: 08/12/2013 13:42   Dg Chest 2 View Within Previous 72 Hours.  Films Obtained On Friday Are Acceptable For Monday And Tuesday Cases  08/06/2013   CLINICAL DATA:  Neck mass  EXAM: CHEST  2 VIEW  COMPARISON:  Chest x-ray dated July 20 2013. And chest CT scan dated July 30, 2013.  FINDINGS: There large paratracheal soft tissue masses obscuring the upper mediastinal heart borders. The trachea is not deviated. The mid and peripheral portions of both lungs appear clear. The cardiac silhouette is not enlarged. There is no pleural effusion. There is no evidence of a pneumothorax.  1.  IMPRESSION: There are known large anterior and superior mediastinal masses. There is no evidence of a pneumothorax or pleural effusion.   Electronically Signed   By: David  [place]   On: 08/06/2013 11:21   Ct Soft Tissue Neck W Contrast  08/12/2013   CLINICAL DATA:  Lymphoma.  Recent biopsy.  Difficulty breathing.  EXAM: CT NECK WITH CONTRAST  TECHNIQUE:  Multidetector CT imaging of the neck was performed using the standard protocol following the bolus administration of intravenous contrast.  CONTRAST:  80mL OMNIPAQUE IOHEXOL 300 MG/ML  SOLN  COMPARISON:  CT chest 07/30/2013.  FINDINGS: Extensive inflammatory reaction accompanies widespread right greater than left cervical lymphadenopathy. This is maximal in right level II and II with conglomerate mass  on the right level II station measuring 28 x 32 mm. Edema of the right greater than left vallecula, aryepiglottic fold, and vocal cords significantly narrows the airway at the level of the glottis. There is slight medialization of the right true vocal cord. Right recurrent laryngeal nerve palsy not excluded due to severe adenopathy.  Moderate mass effect on the right internal jugular without obstruction. Extensive mediastinal adenopathy persists. Normal-appearing thyroid. Negative intracranial compartment. No osseous findings.  IMPRESSION: Extensive inflammatory reaction accompanying widespread right greater than left cervical adenopathy. Hodgkin's lymphoma is favored. Final pathology from previous mediastinal biopsy not yet available.  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 Slight medialization right true vocal cord could indicate recurrent laryngeal nerve palsy. ENT consultation for airway assessment may be warranted.   Electronically Signed   By: Davonna Belling M.D.   On: 08/12/2013 14:02   Ct Chest W Contrast  07/30/2013   CLINICAL DATA:  Mediastinal lymphadenopathy on chest radiograph  EXAM: CT CHEST WITH CONTRAST  TECHNIQUE: Multidetector CT imaging of the chest was performed during intravenous contrast administration.  CONTRAST:  75mL OMNIPAQUE IOHEXOL 300 MG/ML  SOLN  COMPARISON:  Chest radiographs dated 07/20/2013  FINDINGS: Minimal/trace right pleural fluid. Minimal dependent atelectasis. Lungs are  otherwise clear. No pneumothorax.  Visualized thyroid is unremarkable.  Heart is normal in size. No pericardial effusion.  5.5 x 13.7 cm soft tissue mass in the anterior/superior mediastinum (series 2/ image 20), suspicious for aggregate prevascular lymphadenopathy.  Additional lymphadenopathy includes:  --1.6 cm short axis right supraclavicular node (series 2/image 5)  --3.7 cm short axis right paratracheal node (series 2/ image 15)  --2.3 cm short axis left axillary node (series 2/ image 15)  --2.5 cm short axis left IMA node (series 2/ image 27)  Visualized upper abdomen is unremarkable. No visualized lymphadenopathy.  Visualized osseous structures are within normal limits.  IMPRESSION: 5.5 x 13.7 cm soft tissue mass in the anterior/superior mediastinum, corresponding to the radiographic abnormality, suspicious for aggregate prevascular lymphadenopathy related to (Hodgkin's) lymphoma.  Additional thoracic lymphadenopathy, as described above.  Consider biopsy or surgical resection of left axillary nodes for tissue confirmation. Additionally, PET-CT could be considered for further staging.   Electronically Signed   By: Charline Bills M.D.   On: 07/30/2013 10:10   Ct Abdomen Pelvis W Contrast  08/13/2013   CLINICAL DATA:  Staging for Hodgkin's lymphoma.  EXAM: CT ABDOMEN AND PELVIS WITH CONTRAST  TECHNIQUE: Multidetector CT imaging of the abdomen and pelvis was performed using the standard protocol following bolus administration of intravenous contrast.  CONTRAST:  50mL OMNIPAQUE IOHEXOL 300 MG/ML SOLN, OMNIPAQUE IOHEXOL 300 MG/ML SOLN  COMPARISON:  No priors.  FINDINGS: Lung Bases: Dependent atelectasis in the left lower lobe. Otherwise, unremarkable.  Abdomen/Pelvis: The appearance of the liver, gallbladder, pancreas, bilateral adrenal glands and bilateral kidneys is unremarkable. There are 2 intermediate attenuation lesions in the spleen, largest of which is on image 17 of series 2 near the splenic  hilum measuring 2.1 cm in diameter. No significant volume of ascites. No pneumoperitoneum. No pathologic distention of small bowel. Normal appendix. The appearance of the uterus and bilateral ovaries is unremarkable. No definite lymphadenopathy identified within the abdomen or pelvis.  Musculoskeletal: There are no aggressive appearing lytic or blastic lesions noted in the visualized portions of the skeleton.  IMPRESSION: 1. Two intermediate attenuation lesions in the spleen are concerning  in the setting of a known diagnosis of lymphoma, and may represent focal lymphomatous lesions. 2. No lymphadenopathy identified in the abdomen or pelvis.   Electronically Signed   By: Trudie Reed M.D.   On: 08/13/2013 20:37   Ir US Guide Vasc Access Right  08/13/2013   CLINICAL DATA:  Hodgkin's lymphoma, poor venous access; central venous access is requested for fluids and medications.  EXAM: Right upper extremity power PICC LINE PLACEMENT WITH ULTRASOUND AND FLUOROSCOPIC GUIDANCE  FLUOROSCOPY TIME:  42 seconds  PROCEDURE: The patient was advised of the possible risks and complications and agreed to undergo the procedure. The patient was then brought to the angiographic suite for the procedure.  The right arm was prepped with chlorhexidine, draped in the usual sterile fashion using maximum barrier technique (cap and mask, sterile gown, sterile gloves, large sterile sheet, hand hygiene and cutaneous antisepsis) and infiltrated locally with 1% Lidocaine.  Ultrasound demonstrated patency of the right basilic vein, and this was documented with an image. Under real-time ultrasound guidance, this vein was accessed with a 21 gauge micropuncture needle and image documentation was performed. A 0.018 wire was introduced in to the vein. Over this, a 5 [place] double lumen 41 cm power injectable PICC was advanced to the lower SVC/right atrial junction. Fluoroscopy during the procedure and fluoro spot radiograph confirms appropriate  catheter position. The catheter was flushed and covered with a sterile dressing.  Complications: None  IMPRESSION: Successful right arm power PICC line placement with ultrasound and fluoroscopic guidance. The catheter is ready for use  Read by: Jeananne Marifer Hurd ,P.A.-C.   Electronically Signed   By: Irish Lack M.D.   On: 08/13/2013 16:43   Ir Fluoro Guide Cv Midline Picc Right  08/13/2013   CLINICAL DATA:  Hodgkin's lymphoma, poor venous access; central venous access is requested for fluids and medications.  EXAM: Right upper extremity power PICC LINE PLACEMENT WITH ULTRASOUND AND FLUOROSCOPIC GUIDANCE  FLUOROSCOPY TIME:  42 seconds  PROCEDURE: The patient was advised of the possible risks and complications and agreed to undergo the procedure. The patient was then brought to the angiographic suite for the procedure.  The right arm was prepped with chlorhexidine, draped in the usual sterile fashion using maximum barrier technique (cap and mask, sterile gown, sterile gloves, large sterile sheet, hand hygiene and cutaneous antisepsis) and infiltrated locally with 1% Lidocaine.  Ultrasound demonstrated patency of the right basilic vein, and this was documented with an image. Under real-time ultrasound guidance, this vein was accessed with a 21 gauge micropuncture needle and image documentation was performed. A 0.018 wire was introduced in to the vein. Over this, a 5 [place] double lumen 41 cm power injectable PICC was advanced to the lower SVC/right atrial junction. Fluoroscopy during the procedure and fluoro spot radiograph confirms appropriate catheter position. The catheter was flushed and covered with a sterile dressing.  Complications: None  IMPRESSION: Successful right arm power PICC line placement with ultrasound and fluoroscopic guidance. The catheter is ready for use  Read by: Jeananne Bobie Caris ,P.A.-C.   Electronically Signed   By: Irish Lack M.D.   On: 08/13/2013 16:43    Labs:  Basic Metabolic  Panel:  Recent Labs Lab 08/12/13 1255 08/13/13 0327 08/15/13 0515  NA 137 136 136  K 4.0 3.8 4.1  CL 101 101 101  CO2 27 25 26   GLUCOSE 88 148* 123*  BUN 10 10 12   CREATININE 0.72 0.58 0.59  CALCIUM 9.4 9.2 9.2  MG  --   --  2.1  PHOS  --   --  5.1*   GFR Estimated Creatinine Clearance: 140.6 ml/min (by C-G formula based on Cr of 0.59). Liver Function Tests:  Recent Labs Lab 08/15/13 0515  AST 8  ALT 16  ALKPHOS 85  BILITOT 0.1*  PROT 6.7  ALBUMIN 3.0*   CBC:  Recent Labs Lab 08/12/13 1255 08/13/13 0327 08/15/13 0515  WBC 9.5 7.7 9.7  NEUTROABS 8.1*  --   --   HGB 9.1* 9.3* 9.3*  HCT 29.7* 30.3* 30.6*  MCV 74.1* 73.4* 73.7*  PLT 376 416* 353   Anemia work up  Recent Labs  08/12/13 1255  VITAMINB12 1849*  FOLATE 10.4  FERRITIN 138  TIBC Not calculated due to Iron <10.  IRON <10*  RETICCTPCT 1.0   Microbiology Recent Results (from the past 240 hour(s))  SURGICAL PCR SCREEN     Status: Abnormal   Collection Time    08/06/13  9:44 AM      Result Value Range Status   MRSA, PCR NEGATIVE  NEGATIVE Final   Staphylococcus aureus POSITIVE (*) NEGATIVE Final   Comment:            The Xpert SA Assay (FDA     approved for NASAL specimens     in patients over 78 years of age),     is one component of     a comprehensive surveillance     program.  Test performance has     been validated by [employer] for patients greater     than or equal to 71 year old.     It is not intended     to diagnose infection nor to     guide or monitor treatment.    Time coordinating discharge: 45 minutes.  Signed:  Kloey Cazarez  Pager [phone] [facility] 08/15/2013, 10:52 AM

## 2013-08-15 NOTE — Progress Notes (Signed)
IP PROGRESS NOTE  Subjective:   Patient is doing well. No new complaints. She tolerated chemotherapy without complications.  She reported nausea and headaches.   Objective:  Vital signs in last 24 hours: Temp:  [98 F (36.7 C)-98.4 F (36.9 C)] 98.1 F (36.7 C) (11/23 0960) Pulse Rate:  [58-87] 61 (11/23 0633) Resp:  [16-18] 18 (11/23 0633) BP: (106-131)/(59-75) 131/73 mmHg (11/23 0633) SpO2:  [97 %-100 %] 98 % (11/23 4540) Weight change:  Last BM Date: 08/14/13  Intake/Output from previous day: 11/22 0701 - 11/23 0700 In: 3783 [P.O.:240; I.V.:2259; IV Piggyback:291] Out: 3800 [Urine:3800]  Mouth: mucous membranes moist, pharynx normal without lesions Resp: clear to auscultation bilaterally Cardio: regular rate and rhythm, S1, S2 normal, no murmur, click, rub or gallop GI: soft, non-tender; bowel sounds normal; no masses,  no organomegaly Extremities: extremities normal, atraumatic, no cyanosis or edema  PICC-without erythema  Lab Results:  Recent Labs  08/13/13 0327 08/15/13 0515  WBC 7.7 9.7  HGB 9.3* 9.3*  HCT 30.3* 30.6*  PLT 416* 353    BMET  Recent Labs  08/13/13 0327 08/15/13 0515  NA 136 136  K 3.8 4.1  CL 101 101  CO2 25 26  GLUCOSE 148* 123*  BUN 10 12  CREATININE 0.58 0.59  CALCIUM 9.2 9.2     Medications: I have reviewed the patient's current medications.  Assessment/Plan:  22 year old with:  1. Stage IIIB Hodgkin's lymphoma: She is S/P first dose of ABVD chemotherapy on 11/22. Chemotherapy without complications.   Recommendation:  Continue allopurinol 300 mg daily for 30 days then stop   Zofran ODT 8 mg sublingual every 6 hours when necessary for leg nausea vomiting. take this every 8 hours for the next 48 hours after the treatment.   Lorazepam  1 mg every 4 hours when necessary anxiety nausea her sleep.   Advanced care involved for education and maintenance of her PICC line. Anticipate we may then change her to a   Port-A-Cath which is otherwise stable.   2. Respiratory distress: resolved now after treatment.   3. Disposition: OK to discharge home today. Appreciate care from Dr. Darnelle Catalan.     LOS: 3 days   Miranda Villegas 08/15/2013, 8:06 AM

## 2013-08-15 NOTE — Progress Notes (Signed)
   CARE MANAGEMENT NOTE 08/15/2013  Patient:  Miranda Villegas, Miranda Villegas   Account Number:  [id number]  Date Initiated:  08/13/2013  Documentation initiated by:  DAVIS,TYMEEKA  Subjective/Objective Assessment:   22 yo female admitted with Hodgkin's disease with dyspnea secondary to lymphadenopathy and inflammatory reaction status post mediastinoscopy.     Action/Plan:   Home when stable   Anticipated DC Date:  08/15/2013   Anticipated DC Plan:  HOME/SELF CARE      DC Planning Services  CM consult      [facility] Choice  HOME HEALTH   Choice offered to / List presented to:  C-6 Parent   DME arranged  NA      DME agency  NA     HH arranged  HH-1 RN      [facility] agency  [facility]   Status of service:  Completed, signed off Medicare Important Message given?   (If response is "NO", the following Medicare IM given date fields will be blank) Date Medicare IM given:   Date Additional Medicare IM given:    Discharge Disposition:  HOME W HOME HEALTH SERVICES  Per UR Regulation:  Reviewed for med. necessity/level of care/duration of stay  If discussed at Long Length of Stay Meetings, dates discussed:    Comments:  08/15/2013 1440 NCM spoke to pt and parents. Provided [facility] list and offered choice for HH. Agreeable to [facility] for [facility]. Notified [facility] of scheduled dc home today. Isidoro Donning RN CCM Case Mgmt phone [phone]  08/13/13 1516 Roland Earl [phone] Chart reviewed for utilization of services. Will continue to follow.

## 2013-08-16 ENCOUNTER — Telehealth: Payer: Self-pay | Admitting: Oncology

## 2013-08-16 ENCOUNTER — Telehealth: Payer: Self-pay | Admitting: Nurse Practitioner

## 2013-08-16 ENCOUNTER — Ambulatory Visit (HOSPITAL_COMMUNITY): Admit: 2013-08-16 | Payer: 59

## 2013-08-16 ENCOUNTER — Other Ambulatory Visit: Payer: Self-pay | Admitting: Certified Registered Nurse Anesthetist

## 2013-08-16 NOTE — Telephone Encounter (Signed)
[facility] F/U 11/28 @ 11:15 W/LISA THOMAS ASKED PT TO CALL BACK TO CONFIRM MESSAGE WAS RECEIVED.

## 2013-08-16 NOTE — Telephone Encounter (Signed)
C/D 08/16/13 for appt 08/20/13

## 2013-08-16 NOTE — Telephone Encounter (Signed)
sw pt sch chemo ed class per 11/23 POF pt may have to cb and rs shh °

## 2013-08-17 ENCOUNTER — Telehealth: Payer: Self-pay | Admitting: *Deleted

## 2013-08-17 ENCOUNTER — Other Ambulatory Visit: Payer: Self-pay | Admitting: Oncology

## 2013-08-17 ENCOUNTER — Ambulatory Visit: Payer: 59 | Admitting: Thoracic Surgery (Cardiothoracic Vascular Surgery)

## 2013-08-17 NOTE — Telephone Encounter (Addendum)
Miranda Villegas's father, Miranda Villegas called reporting he needs a letter for his daughter's employment per her Agricultural consultant.  "Briceyda has been working five months and does not qualify for [employer] and we're trying to protect her employment and benefits.  She was to return to work yesterday.  I can pick up a letter and/or it needs to be faxed with cover sheet,  ATTN. [facility] [place] fax # [phone], with the store number [employer] [phone]."   For a medical leave of absence, the letter needs to include the days she's been under Dr. Patsy Lager care through hospitalization, including yesterday because she was to return to work yesterday.  She was instructed not to return to work so [employer]  basically needs to know the estimated time she will be out of work.  "Has to cover days in his care until she is cleared to return to work."  There may be forms later but for now this is what we were instructed to do.  Her Father, Miranda Villegas can be reached at [phone].  Will notify providers.      Dr. Cyndie Chime wrote letter.  This nurse faxed at 12:45 with receipt confirmation.  Called Miranda Villegas and they will pick letter up on Friday.

## 2013-08-18 ENCOUNTER — Telehealth: Payer: Self-pay | Admitting: *Deleted

## 2013-08-18 ENCOUNTER — Telehealth: Payer: Self-pay | Admitting: Oncology

## 2013-08-18 ENCOUNTER — Other Ambulatory Visit: Payer: 59

## 2013-08-18 NOTE — Telephone Encounter (Signed)
Called & spoke with pt's father, Jonny Ruiz & informed that his messages were discussed with Dr Cyndie Chime & he agreed with all of our suggestions & didn't think that any of these symptoms were from the flu shot.  He also states that she is young & all this is new to her & she is probably very anxious & should be feeling better in a few days.  He is not concerned about her hemoglobin being low at this time.  Encouraged to call back if symptoms do not improve.  He reports that pt has had a BM & is feeling some better now.  Informed per Dr Cyndie Chime that she can use Magnesium Citrate-/1/2 bottle if she needs to later.  He was very appreciative of the call.

## 2013-08-18 NOTE — Telephone Encounter (Signed)
Talked to pt gave her appt for chemo class for 12/1

## 2013-08-18 NOTE — Telephone Encounter (Signed)
Received vm call from Miranda Villegas who states he is the father of [name] & states that pt can hardly pick her head up off the pillow & is unable to stand up with out vomiting & has had a couple of fainting spells & has also not had a BM since Sat or Sun & is also having some indigestion. He wants to cancel chemotherapy class today.  He states that she has Zofran & phenergan but the phenergan wipes her out.  He has given her Tums but wants to know if she could have some Pepcid or something like that.  Returned call to father & pt had chemo last sat inpatient & states Zofran is working well but has given her phenergan 12.5 mg for breakthrough. He states that she feels really nauseated when standing up but has only vomited once yest & feels better laying down.  He mentioned that she received the flu shot & wonders if this could be from that. He has given her 3 Senakot-S yest & she vomited once clear liq @ 1 hour after that. He wanted to know if she could take miralax & was told that this was OK to try.  Informed OK to take Pepcid.  She has taken some oxycodone for pain & was informed that she might need to take Senakot with each dose b/c this can also contribute to her constipation.  Will discuss with Dr Cyndie Chime

## 2013-08-20 ENCOUNTER — Encounter: Payer: Self-pay | Admitting: Oncology

## 2013-08-20 ENCOUNTER — Ambulatory Visit (HOSPITAL_BASED_OUTPATIENT_CLINIC_OR_DEPARTMENT_OTHER): Payer: 59 | Admitting: Nurse Practitioner

## 2013-08-20 ENCOUNTER — Ambulatory Visit: Payer: 59

## 2013-08-20 ENCOUNTER — Ambulatory Visit (HOSPITAL_BASED_OUTPATIENT_CLINIC_OR_DEPARTMENT_OTHER): Payer: 59 | Admitting: Lab

## 2013-08-20 ENCOUNTER — Other Ambulatory Visit: Payer: Self-pay | Admitting: Certified Registered Nurse Anesthetist

## 2013-08-20 VITALS — BP 128/73 | HR 122 | Temp 97.9°F | Resp 20 | Ht 69.0 in | Wt 211.9 lb

## 2013-08-20 DIAGNOSIS — M79609 Pain in unspecified limb: Secondary | ICD-10-CM

## 2013-08-20 DIAGNOSIS — C8110 Nodular sclerosis classical Hodgkin lymphoma, unspecified site: Secondary | ICD-10-CM

## 2013-08-20 DIAGNOSIS — R110 Nausea: Secondary | ICD-10-CM

## 2013-08-20 DIAGNOSIS — R590 Localized enlarged lymph nodes: Secondary | ICD-10-CM

## 2013-08-20 DIAGNOSIS — C8190 Hodgkin lymphoma, unspecified, unspecified site: Secondary | ICD-10-CM

## 2013-08-20 DIAGNOSIS — R51 Headache: Secondary | ICD-10-CM

## 2013-08-20 DIAGNOSIS — D509 Iron deficiency anemia, unspecified: Secondary | ICD-10-CM

## 2013-08-20 DIAGNOSIS — R000 Tachycardia, unspecified: Secondary | ICD-10-CM

## 2013-08-20 LAB — COMPREHENSIVE METABOLIC PANEL (CC13)
AST: 11 U/L (ref 5–34)
Albumin: 3.4 g/dL — ABNORMAL LOW (ref 3.5–5.0)
Anion Gap: 8 mEq/L (ref 3–11)
BUN: 13.7 mg/dL (ref 7.0–26.0)
CO2: 25 mEq/L (ref 22–29)
Calcium: 9.5 mg/dL (ref 8.4–10.4)
Chloride: 103 mEq/L (ref 98–109)
Creatinine: 0.7 mg/dL (ref 0.6–1.1)
Glucose: 78 mg/dl (ref 70–140)
Potassium: 4.3 mEq/L (ref 3.5–5.1)

## 2013-08-20 LAB — CBC WITH DIFFERENTIAL/PLATELET
Basophils Absolute: 0 10*3/uL (ref 0.0–0.1)
Eosinophils Absolute: 0.1 10*3/uL (ref 0.0–0.5)
HGB: 10.9 g/dL — ABNORMAL LOW (ref 11.6–15.9)
LYMPH%: 19 % (ref 14.0–49.7)
MONO#: 0.1 10*3/uL (ref 0.1–0.9)
NEUT#: 4.1 10*3/uL (ref 1.5–6.5)
NEUT%: 77.2 % — ABNORMAL HIGH (ref 38.4–76.8)
Platelets: 400 10*3/uL (ref 145–400)
RDW: 15.3 % — ABNORMAL HIGH (ref 11.2–14.5)
lymph#: 1 10*3/uL (ref 0.9–3.3)

## 2013-08-20 LAB — LACTATE DEHYDROGENASE (CC13): LDH: 311 U/L — ABNORMAL HIGH (ref 125–245)

## 2013-08-20 NOTE — Progress Notes (Signed)
Checked in new patient with no financial issues. She has not been to [place]. °

## 2013-08-20 NOTE — Progress Notes (Addendum)
OFFICE PROGRESS NOTE  Interval history:  [name] is a 22 year old woman recently diagnosed with stage IIIB Hodgkin's lymphoma. She presented with a several month history of adenopathy, night sweats, fevers and weight loss. More recently she developed progressive dysphagia and respiratory distress. She was found to have a large mediastinal mass on chest x-ray. CT on 07/30/2013 showed a large anterior mediastinal mass measuring 5.5 x 13.7 cm with additional lymph nodes in the right supraclavicular, right paratracheal, left axillary and a "left IMA node". Mediastinoscopy with biopsy was done on 08/10/2003 and showed Hodgkin's lymphoma.  She was hospitalized on 08/12/2013 with respiratory distress.  Neck CT showed extensive inflammatory reaction accompanying widespread right greater than left cervical adenopathy. 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 CT abdomen/pelvis showed 2 indeterminate attenuation lesions in the spleen possibly representing focal lymphomatous lesions. No lymphadenopathy identified in the abdomen or pelvis.  Symptoms improved with steroids.  CBC on admission showed a hemoglobin of 9.1, white count 9.5, absolute neutrophil count 8.1 and platelet count 376. ESR elevated at 83 on 08/13/2013 and LDH elevated at 502. Baseline 2-D echo on 08/13/2013 showed a left ventricular ejection fraction of 60-65%. Baseline PFTs 08/13/2013 with normal DLCO.   She was started on allopurinol 300 mg daily.  Treatment was initiated during the hospitalization with ABVD on 08/14/2013. She was discharged home on 08/15/2013.  She presents today for scheduled followup. She developed a "migraine" headache during the hospitalization. She continued to have headaches for several days. We discussed the possibility the headaches were related to Zofran as she was taking Zofran consistently for 48  hours following discharge. In retrospect her mother thinks this may be the case. She developed constipation which was relieved with Senokot-S. Appetite returned yesterday. She is intermittently dizzy with standing. She denies shortness of breath. No cough or fever. She has pain at the right upper posterior arm. She denies any swelling or redness. She notes itching under the PICC dressing. No numbness or tingling in her hands or feet.  Objective: Blood pressure 128/73, pulse 122, temperature 97.9 F (36.6 C), temperature source Oral, resp. rate 20, height 5\' 9"  (1.753 m), weight 211 lb 14.4 oz (96.117 kg), last menstrual period 08/05/2013.  Oropharynx is without thrush or ulceration. Approximate 1 cm right anterior cervical lymph node. Fullness/firmness at the left suprapubic region without discrete adenopathy. Less than 2 cm left axillary lymph node. Lungs are clear. Regular cardiac rhythm. Tachycardic. Abdomen soft and nontender. No organomegaly. Extremities without edema. Calves soft and nontender. Motor strength 5 over 5. Right upper extremity PICC site is without erythema. Right upper posterior arm is tender with palpation. No arm edema or erythema and no palpable cord. No superficial venous distention at the upper arm or chest.  Lab Results: Lab Results  Component Value Date   WBC 9.7 08/15/2013   HGB 9.3* 08/15/2013   HCT 30.6* 08/15/2013   MCV 73.7* 08/15/2013   PLT 353 08/15/2013    Chemistry:    Chemistry      Component Value Date/Time   NA 136 08/15/2013 0515   K 4.1 08/15/2013 0515   CL 101 08/15/2013 0515   CO2 26 08/15/2013 0515   BUN 12 08/15/2013 0515   CREATININE 0.59 08/15/2013 0515      Component Value Date/Time   CALCIUM 9.2 08/15/2013 0515   ALKPHOS 85 08/15/2013 0515   AST 8 08/15/2013 0515  ALT 16 08/15/2013 0515   BILITOT 0.1* 08/15/2013 0515       Studies/Results: Dg Chest 2 View  08/12/2013   CLINICAL DATA:  Mediastinal mass biopsy 3 days ago. Short  of breath and increased swelling of neck  EXAM: CHEST  2 VIEW  COMPARISON:  08/06/2013  FINDINGS: Large anterior mediastinal lymph node mass is unchanged allowing for AP projection.  Negative for infiltrate effusion. Negative for pneumothorax. If there is concern about SVC syndrome, consider CT chest with contrast.  IMPRESSION: Large anterior mediastinal mass is unchanged. No change from the prior chest x-ray.   Electronically Signed   By: Marlan Palau M.D.   On: 08/12/2013 13:42   Dg Chest 2 View Within Previous 72 Hours.  Films Obtained On Friday Are Acceptable For Monday And Tuesday Cases  08/06/2013   CLINICAL DATA:  Neck mass  EXAM: CHEST  2 VIEW  COMPARISON:  Chest x-ray dated July 20 2013. And chest CT scan dated July 30, 2013.  FINDINGS: There large paratracheal soft tissue masses obscuring the upper mediastinal heart borders. The trachea is not deviated. The mid and peripheral portions of both lungs appear clear. The cardiac silhouette is not enlarged. There is no pleural effusion. There is no evidence of a pneumothorax.  1.  IMPRESSION: There are known large anterior and superior mediastinal masses. There is no evidence of a pneumothorax or pleural effusion.   Electronically Signed   By: David  [place]   On: 08/06/2013 11:21   Ct Soft Tissue Neck W Contrast  08/12/2013   CLINICAL DATA:  Lymphoma.  Recent biopsy.  Difficulty breathing.  EXAM: CT NECK WITH CONTRAST  TECHNIQUE: Multidetector CT imaging of the neck was performed using the standard protocol following the bolus administration of intravenous contrast.  CONTRAST:  80mL OMNIPAQUE IOHEXOL 300 MG/ML  SOLN  COMPARISON:  CT chest 07/30/2013.  FINDINGS: Extensive inflammatory reaction accompanies widespread right greater than left cervical lymphadenopathy. This is maximal in right level II and II with conglomerate mass on the right level II station measuring 28 x 32 mm. Edema of the right greater than left vallecula, aryepiglottic fold,  and vocal cords significantly narrows the airway at the level of the glottis. There is slight medialization of the right true vocal cord. Right recurrent laryngeal nerve palsy not excluded due to severe adenopathy.  Moderate mass effect on the right internal jugular without obstruction. Extensive mediastinal adenopathy persists. Normal-appearing thyroid. Negative intracranial compartment. No osseous findings.  IMPRESSION: Extensive inflammatory reaction accompanying widespread right greater than left cervical adenopathy. Hodgkin's lymphoma is favored. Final pathology from previous mediastinal biopsy not yet available.  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 Slight medialization right true vocal cord could indicate recurrent laryngeal nerve palsy. ENT consultation for airway assessment may be warranted.   Electronically Signed   By: Davonna Belling M.D.   On: 08/12/2013 14:02   Ct Chest W Contrast  07/30/2013   CLINICAL DATA:  Mediastinal lymphadenopathy on chest radiograph  EXAM: CT CHEST WITH CONTRAST  TECHNIQUE: Multidetector CT imaging of the chest was performed during intravenous contrast administration.  CONTRAST:  75mL OMNIPAQUE IOHEXOL 300 MG/ML  SOLN  COMPARISON:  Chest radiographs dated 07/20/2013  FINDINGS: Minimal/trace right pleural fluid. Minimal dependent atelectasis. Lungs are otherwise clear. No pneumothorax.  Visualized thyroid is unremarkable.  Heart is normal in size. No pericardial effusion.  5.5 x 13.7 cm  soft tissue mass in the anterior/superior mediastinum (series 2/ image 20), suspicious for aggregate prevascular lymphadenopathy.  Additional lymphadenopathy includes:  --1.6 cm short axis right supraclavicular node (series 2/image 5)  --3.7 cm short axis right paratracheal node (series 2/ image 15)  --2.3 cm short axis left axillary node (series 2/ image 15)  --2.5 cm short axis left IMA  node (series 2/ image 27)  Visualized upper abdomen is unremarkable. No visualized lymphadenopathy.  Visualized osseous structures are within normal limits.  IMPRESSION: 5.5 x 13.7 cm soft tissue mass in the anterior/superior mediastinum, corresponding to the radiographic abnormality, suspicious for aggregate prevascular lymphadenopathy related to (Hodgkin's) lymphoma.  Additional thoracic lymphadenopathy, as described above.  Consider biopsy or surgical resection of left axillary nodes for tissue confirmation. Additionally, PET-CT could be considered for further staging.   Electronically Signed   By: Charline Bills M.D.   On: 07/30/2013 10:10   Ct Abdomen Pelvis W Contrast  08/13/2013   CLINICAL DATA:  Staging for Hodgkin's lymphoma.  EXAM: CT ABDOMEN AND PELVIS WITH CONTRAST  TECHNIQUE: Multidetector CT imaging of the abdomen and pelvis was performed using the standard protocol following bolus administration of intravenous contrast.  CONTRAST:  50mL OMNIPAQUE IOHEXOL 300 MG/ML SOLN, OMNIPAQUE IOHEXOL 300 MG/ML SOLN  COMPARISON:  No priors.  FINDINGS: Lung Bases: Dependent atelectasis in the left lower lobe. Otherwise, unremarkable.  Abdomen/Pelvis: The appearance of the liver, gallbladder, pancreas, bilateral adrenal glands and bilateral kidneys is unremarkable. There are 2 intermediate attenuation lesions in the spleen, largest of which is on image 17 of series 2 near the splenic hilum measuring 2.1 cm in diameter. No significant volume of ascites. No pneumoperitoneum. No pathologic distention of small bowel. Normal appendix. The appearance of the uterus and bilateral ovaries is unremarkable. No definite lymphadenopathy identified within the abdomen or pelvis.  Musculoskeletal: There are no aggressive appearing lytic or blastic lesions noted in the visualized portions of the skeleton.  IMPRESSION: 1. Two intermediate attenuation lesions in the spleen are concerning in the setting of a known diagnosis  of lymphoma, and may represent focal lymphomatous lesions. 2. No lymphadenopathy identified in the abdomen or pelvis.   Electronically Signed   By: Trudie Reed M.D.   On: 08/13/2013 20:37   Ir US Guide Vasc Access Right  08/13/2013   CLINICAL DATA:  Hodgkin's lymphoma, poor venous access; central venous access is requested for fluids and medications.  EXAM: Right upper extremity power PICC LINE PLACEMENT WITH ULTRASOUND AND FLUOROSCOPIC GUIDANCE  FLUOROSCOPY TIME:  42 seconds  PROCEDURE: The patient was advised of the possible risks and complications and agreed to undergo the procedure. The patient was then brought to the angiographic suite for the procedure.  The right arm was prepped with chlorhexidine, draped in the usual sterile fashion using maximum barrier technique (cap and mask, sterile gown, sterile gloves, large sterile sheet, hand hygiene and cutaneous antisepsis) and infiltrated locally with 1% Lidocaine.  Ultrasound demonstrated patency of the right basilic vein, and this was documented with an image. Under real-time ultrasound guidance, this vein was accessed with a 21 gauge micropuncture needle and image documentation was performed. A 0.018 wire was introduced in to the vein. Over this, a 5 [place] double lumen 41 cm power injectable PICC was advanced to the lower SVC/right atrial junction. Fluoroscopy during the procedure and fluoro spot radiograph confirms appropriate catheter position. The catheter was flushed and covered with a sterile dressing.  Complications: None  IMPRESSION: Successful right arm  power PICC line placement with ultrasound and fluoroscopic guidance. The catheter is ready for use  Read by: Jeananne Rama ,P.A.-C.   Electronically Signed   By: Irish Lack M.D.   On: 08/13/2013 16:43   Ir Fluoro Guide Cv Midline Picc Right  08/13/2013   CLINICAL DATA:  Hodgkin's lymphoma, poor venous access; central venous access is requested for fluids and medications.  EXAM: Right  upper extremity power PICC LINE PLACEMENT WITH ULTRASOUND AND FLUOROSCOPIC GUIDANCE  FLUOROSCOPY TIME:  42 seconds  PROCEDURE: The patient was advised of the possible risks and complications and agreed to undergo the procedure. The patient was then brought to the angiographic suite for the procedure.  The right arm was prepped with chlorhexidine, draped in the usual sterile fashion using maximum barrier technique (cap and mask, sterile gown, sterile gloves, large sterile sheet, hand hygiene and cutaneous antisepsis) and infiltrated locally with 1% Lidocaine.  Ultrasound demonstrated patency of the right basilic vein, and this was documented with an image. Under real-time ultrasound guidance, this vein was accessed with a 21 gauge micropuncture needle and image documentation was performed. A 0.018 wire was introduced in to the vein. Over this, a 5 [place] double lumen 41 cm power injectable PICC was advanced to the lower SVC/right atrial junction. Fluoroscopy during the procedure and fluoro spot radiograph confirms appropriate catheter position. The catheter was flushed and covered with a sterile dressing.  Complications: None  IMPRESSION: Successful right arm power PICC line placement with ultrasound and fluoroscopic guidance. The catheter is ready for use  Read by: Jeananne Rama ,P.A.-C.   Electronically Signed   By: Irish Lack M.D.   On: 08/13/2013 16:43    Medications: I have reviewed the patient's current medications.  Assessment/Plan:  1. Stage IIIB Hodgkin's lymphoma status post cycle 1 day 1 ABVD 08/14/2013. 2. Respiratory distress secondary to bulky cervical and anterior mediastinal lymphadenopathy initially treated with steroids with improvement; now resolved. 3. Headache following cycle 1 day 1 ABVD. Likely secondary to Zofran. We will discontinue the Zofran from the treatment plan and add Aloxi. 4. Delayed nausea following cycle 1 day 1 ABVD. Aloxi to be added. 5. Pain/tenderness right upper  posterior arm. She understands to contact the office with swelling and/or erythema. 6. Tachycardia. Question etiology. She appears asymptomatic. We will followup on the hemoglobin from today. 7. Status post PICC placement 08/13/2013. She prefers a Port-A-Cath. We will determine timing of Port-A-Cath placement pending blood counts from today. 8. Anemia, microcytic with iron less than 10 and ferritin 138 on 08/12/2013.   Disposition-she appears stable. We will obtain a CBC, chemistry panel and LDH today. She is scheduled for a staging PET scan on 08/30/2013. We will try to have this moved to be done prior to cycle 1 day 15 ABVD which she is scheduled for on 08/27/2013. We will see her in followup on 09/10/2013. She will contact the office in the interim as outlined above or with any other problems.  Patient seen with Dr. Cyndie Chime.   Lonna Cobb ANP/GNP-BC   Hematology oncology attending: History, physical, and impression accurate as recorded above by nurse practitioner. I personally examined this patient today and discussed further management of her Hodgkin's lymphoma with her and her parents. Overall, she tolerated the first cycle of ABVD very well. She did get delayed headache likely due to the Zofran premedication. We will delete this and substitute with Aloxi. She's had some orthostatic dizziness and tachycardia. Normal cardiac ejection fraction baseline prior to  starting the treatment. Pulmonary functions with DLCO normal. Her first treatment was given on Saturday 11/22 2014. Blood counts today are quite respectable with no signs of an early or profound nadir with total white count 5300, hemoglobin 10.9, and platelet count 400,000. Impression: Stage IIIB Hodgkin's lymphoma Plan: Continue current chemotherapy and in full doses. Do not decrease doses as long as absolute neutrophil count is 500 or above. If significant falling counts, use Neulasta support instead of dose reduction if at  all possible. We will try to coordinate placing a Port-A-Cath infusion device and removing the PICC catheter with her blood counts.   Cephas Darby, MD, FACP  Hematology-Oncology

## 2013-08-20 NOTE — Patient Instructions (Signed)
Peripherally Inserted Central Catheter (PICC) Home Guide A peripherally inserted central catheter (PICC) is a long, thin, flexible tube that is inserted into a vein in the upper arm. It is a form of intravenous (IV) access. It is considered to be a "central" line because the tip of the PICC ends in a large vein in your chest. This large vein is called the superior vena cava (SVC). The PICC tip ends in the SVC because there is a lot of blood flow in the SVC. This allows medicines and IV fluids to be quickly distributed throughout the body. The PICC is inserted using a sterile technique by a specially trained nurse or physician. After the PICC is inserted, a chest X-ray is done to be sure it is in the correct place.  A PICC may be placed for different reasons, such as:  To give medicines and liquid nutrition that can only be given through a central line. Examples are:  Certain antibiotic treatments.  Chemotherapy.  Total parenteral nutrition (TPN).  To take frequent blood samples.  To give IV fluids and blood products.  If there is difficulty placing a peripheral intravenous (PIV) catheter. If taken care of properly, a PICC can remain in place for several months. A PICC can also allow patients to go home early. Medicine and PICC care can be managed at home by a family member or home healthcare team. RISKS AND COMPLICATIONS Possible problems with a PICC can occasionally occur. This may include:  A clot (thrombus) forming in or at the tip of the PICC. This can cause the PICC to become clogged. A "clot-busting" medicine called tissue plasminogen activator (tPA) can be inserted into the PICC to help break up the clot.  Inflammation of the vein (phlebitis) in which the PICC is placed. Signs of inflammation may include redness, pain at the insertion site, red streaks, or being able to feel a "cord" in the vein where the PICC is located.  Infection in the PICC or at the insertion site. Signs of  infection may include fever, chills, redness, swelling, or pus drainage from the PICC insertion site.  PICC movement (malposition). The PICC tip may migrate from its original position due to excessive physical activity, forceful coughing, sneezing, or vomiting.  A break or cut in the PICC. It is important to not use scissors near the PICC.  Nerve or tendon irritation or injury during PICC insertion. HOME CARE INSTRUCTIONS Activity  You may bend your arm and move it freely. If your PICC is near or at the bend of your elbow, avoid activity with repeated motion at the elbow.  Avoid lifting heavy objects as instructed by your caregiver.  Avoid using a crutch with the arm on the same side as your PICC. You may need to use a walker. PICC Dressing  Keep your PICC bandage (dressing) clean and dry to prevent infection.  Ask your caregiver when you may shower. Ask your caregiver to teach you how to wrap the PICC when you do take a shower.  Do not bathe, swim, or use hot tubs when you have a PICC.  Change the PICC dressing as instructed by your caregiver.  Change your PICC dressing if it becomes loose or wet. General PICC Care  Check the PICC insertion site daily for leakage, redness, swelling, or pain.  Flush the PICC as directed by your caregiver. Let your caregiver know right away if the PICC is difficult to flush or does not flush. Do not use force   to flush the PICC.  Do not use a syringe that is less than 10 mLs to flush the PICC.  Never pull or tug on the PICC.  Avoid blood pressure checks on the arm with the PICC.  Keep your PICC identification card with you at all times.  Do not take the PICC out yourself. Only a trained clinical professional should remove the PICC. SEEK IMMEDIATE MEDICAL CARE IF:  Your PICC is accidently pulled all the way out. If this happens, cover the insertion site with a bandage or gauze dressing. Do not throw the PICC away. Your caregiver will need to  inspect it.  Your PICC was tugged or pulled and has partially come out. Do not  push the PICC back in.  There is any type of drainage, redness, or swelling where the PICC enters the skin.  You cannot flush the PICC, it is difficult to flush, or the PICC leaks around the insertion site when it is flushed.  You hear a "flushing" sound when the PICC is flushed.  You have pain, discomfort, or numbness in your arm, shoulder, or jaw on the same side as the PICC .  You feel your heart "racing" or skipping beats.  You notice a hole or tear in the PICC.  You develop chills or a fever. MAKE SURE YOU:   Understand these instructions.  Will watch your condition.  Will get help right away if you are not doing well or get worse. Document Released: 03/16/2003 Document Revised: 12/02/2011 Document Reviewed: 01/14/2011 [employer] Patient Information 2014 [employer].  

## 2013-08-23 ENCOUNTER — Other Ambulatory Visit: Payer: 59

## 2013-08-23 ENCOUNTER — Telehealth: Payer: Self-pay | Admitting: Oncology

## 2013-08-23 ENCOUNTER — Encounter: Payer: Self-pay | Admitting: Oncology

## 2013-08-23 NOTE — Telephone Encounter (Signed)
s/w pt re next appt for 12/5 and pt to get schedule when she comes in. also no sooner appt for pet @ [facility]. will try [place]. pt aware.

## 2013-08-23 NOTE — Progress Notes (Signed)
Put fmla form on nurse's desk °

## 2013-08-24 ENCOUNTER — Encounter: Payer: Self-pay | Admitting: Oncology

## 2013-08-24 ENCOUNTER — Telehealth: Payer: Self-pay | Admitting: Oncology

## 2013-08-24 ENCOUNTER — Telehealth: Payer: Self-pay | Admitting: *Deleted

## 2013-08-24 NOTE — Progress Notes (Signed)
Faxed fmla form to [employer] @ [phone].

## 2013-08-24 NOTE — Telephone Encounter (Signed)
Called pt per request of Melissa/Scheduler who is trying to schedule pt's PET scan & radiology needs to know if she is pregnant & when her last period was.  The pt reports that she is not sexually active, not pregnant, & last period was Nov. 11th or 13th of 2014.  This message was relayed to [facility].

## 2013-08-24 NOTE — Progress Notes (Signed)
Put [employer] disability form on MD's desk.

## 2013-08-24 NOTE — Telephone Encounter (Signed)
scheduled pt for pet scan @ [place] due to 12/8 @ [facility] too far off. s/w pt and gv pt appt d/t/location and phone #. order to [facility] in preauth to move auth from [employer] to [place].  s/w Clydie Braun @ [place] today and pt scheduled for 12/4 @ 9am. order faxed [phone]. phone # [phone]. per desk nurse pt not pregnant, last cycle 11/11 or 11/13 and pt not sexually active. will make [place] aware of this tomorrow as thse ?'s needing to be answered prior to test

## 2013-08-26 ENCOUNTER — Ambulatory Visit: Payer: Self-pay | Admitting: Oncology

## 2013-08-27 ENCOUNTER — Ambulatory Visit (HOSPITAL_BASED_OUTPATIENT_CLINIC_OR_DEPARTMENT_OTHER): Payer: 59

## 2013-08-27 ENCOUNTER — Other Ambulatory Visit (HOSPITAL_BASED_OUTPATIENT_CLINIC_OR_DEPARTMENT_OTHER): Payer: 59 | Admitting: Lab

## 2013-08-27 ENCOUNTER — Encounter: Payer: Self-pay | Admitting: Oncology

## 2013-08-27 VITALS — BP 136/68 | HR 92 | Temp 98.3°F | Resp 20

## 2013-08-27 DIAGNOSIS — C8110 Nodular sclerosis classical Hodgkin lymphoma, unspecified site: Secondary | ICD-10-CM

## 2013-08-27 DIAGNOSIS — R590 Localized enlarged lymph nodes: Secondary | ICD-10-CM

## 2013-08-27 DIAGNOSIS — Z5111 Encounter for antineoplastic chemotherapy: Secondary | ICD-10-CM

## 2013-08-27 DIAGNOSIS — C8190 Hodgkin lymphoma, unspecified, unspecified site: Secondary | ICD-10-CM

## 2013-08-27 LAB — COMPREHENSIVE METABOLIC PANEL (CC13)
Albumin: 3.6 g/dL (ref 3.5–5.0)
Alkaline Phosphatase: 97 U/L (ref 40–150)
Anion Gap: 11 mEq/L (ref 3–11)
BUN: 11.1 mg/dL (ref 7.0–26.0)
CO2: 23 mEq/L (ref 22–29)
Calcium: 9.6 mg/dL (ref 8.4–10.4)
Creatinine: 0.7 mg/dL (ref 0.6–1.1)
Glucose: 84 mg/dl (ref 70–140)
Potassium: 4.3 mEq/L (ref 3.5–5.1)
Sodium: 139 mEq/L (ref 136–145)
Total Protein: 7.1 g/dL (ref 6.4–8.3)

## 2013-08-27 LAB — CBC WITH DIFFERENTIAL/PLATELET
Basophils Absolute: 0 10*3/uL (ref 0.0–0.1)
EOS%: 3.1 % (ref 0.0–7.0)
Eosinophils Absolute: 0.1 10*3/uL (ref 0.0–0.5)
HCT: 36 % (ref 34.8–46.6)
MCH: 22.7 pg — ABNORMAL LOW (ref 25.1–34.0)
MCHC: 30.3 g/dL — ABNORMAL LOW (ref 31.5–36.0)
NEUT%: 56.2 % (ref 38.4–76.8)
Platelets: 336 10*3/uL (ref 145–400)
RBC: 4.8 10*6/uL (ref 3.70–5.45)
RDW: 17.4 % — ABNORMAL HIGH (ref 11.2–14.5)
WBC: 3 10*3/uL — ABNORMAL LOW (ref 3.9–10.3)
lymph#: 0.9 10*3/uL (ref 0.9–3.3)
nRBC: 0 % (ref 0–0)

## 2013-08-27 MED ORDER — HEPARIN SOD (PORK) LOCK FLUSH 100 UNIT/ML IV SOLN
250.0000 [IU] | Freq: Once | INTRAVENOUS | Status: AC
  Administered 2013-08-27: 250 [IU] via INTRAVENOUS
  Filled 2013-08-27: qty 5

## 2013-08-27 MED ORDER — HEPARIN SOD (PORK) LOCK FLUSH 100 UNIT/ML IV SOLN
500.0000 [IU] | Freq: Once | INTRAVENOUS | Status: DC | PRN
  Filled 2013-08-27: qty 5

## 2013-08-27 MED ORDER — SODIUM CHLORIDE 0.9 % IV SOLN
10.0000 [IU]/m2 | Freq: Once | INTRAVENOUS | Status: AC
  Administered 2013-08-27: 22 [IU] via INTRAVENOUS
  Filled 2013-08-27: qty 7.33

## 2013-08-27 MED ORDER — PALONOSETRON HCL INJECTION 0.25 MG/5ML
INTRAVENOUS | Status: AC
  Filled 2013-08-27: qty 5

## 2013-08-27 MED ORDER — SODIUM CHLORIDE 0.9 % IV SOLN
Freq: Once | INTRAVENOUS | Status: AC
  Administered 2013-08-27: 11:00:00 via INTRAVENOUS

## 2013-08-27 MED ORDER — PALONOSETRON HCL INJECTION 0.25 MG/5ML
0.2500 mg | Freq: Once | INTRAVENOUS | Status: AC
  Administered 2013-08-27: 0.25 mg via INTRAVENOUS

## 2013-08-27 MED ORDER — SODIUM CHLORIDE 0.9 % IJ SOLN
10.0000 mL | INTRAMUSCULAR | Status: DC | PRN
  Filled 2013-08-27: qty 10

## 2013-08-27 MED ORDER — VINBLASTINE SULFATE CHEMO INJECTION 1 MG/ML
5.9000 mg/m2 | Freq: Once | INTRAVENOUS | Status: AC
  Administered 2013-08-27: 13 mg via INTRAVENOUS
  Filled 2013-08-27: qty 13

## 2013-08-27 MED ORDER — DEXAMETHASONE SODIUM PHOSPHATE 20 MG/5ML IJ SOLN
INTRAMUSCULAR | Status: AC
  Filled 2013-08-27: qty 5

## 2013-08-27 MED ORDER — DOXORUBICIN HCL CHEMO IV INJECTION 2 MG/ML
25.0000 mg/m2 | Freq: Once | INTRAVENOUS | Status: AC
  Administered 2013-08-27: 56 mg via INTRAVENOUS
  Filled 2013-08-27: qty 28

## 2013-08-27 MED ORDER — DEXAMETHASONE SODIUM PHOSPHATE 20 MG/5ML IJ SOLN
20.0000 mg | Freq: Once | INTRAMUSCULAR | Status: AC
  Administered 2013-08-27: 20 mg via INTRAVENOUS

## 2013-08-27 MED ORDER — SODIUM CHLORIDE 0.9 % IJ SOLN
3.0000 mL | Freq: Once | INTRAMUSCULAR | Status: AC
  Administered 2013-08-27: 14:00:00 via INTRAVENOUS
  Filled 2013-08-27: qty 10

## 2013-08-27 MED ORDER — SODIUM CHLORIDE 0.9 % IV SOLN
375.0000 mg/m2 | Freq: Once | INTRAVENOUS | Status: AC
  Administered 2013-08-27: 820 mg via INTRAVENOUS
  Filled 2013-08-27: qty 41

## 2013-08-27 NOTE — Progress Notes (Signed)
Blood return noted before, during and after Vinblastine.

## 2013-08-27 NOTE — Progress Notes (Signed)
Faxed disability paper to [facility] @ [phone].

## 2013-08-27 NOTE — Patient Instructions (Signed)
[facility] Discharge Instructions for Patients Receiving Chemotherapy  Today you received the following chemotherapy agents Adriamycin, Vinblastine, Bleomycin and Dacarbazine.   To help prevent nausea and vomiting after your treatment, we encourage you to take your nausea medication as prescribed.   If you develop nausea and vomiting that is not controlled by your nausea medication, call the clinic.   BELOW ARE SYMPTOMS THAT SHOULD BE REPORTED IMMEDIATELY:  *FEVER GREATER THAN 100.5 F  *CHILLS WITH OR WITHOUT FEVER  NAUSEA AND VOMITING THAT IS NOT CONTROLLED WITH YOUR NAUSEA MEDICATION  *UNUSUAL SHORTNESS OF BREATH  *UNUSUAL BRUISING OR BLEEDING  TENDERNESS IN MOUTH AND THROAT WITH OR WITHOUT PRESENCE OF ULCERS  *URINARY PROBLEMS  *BOWEL PROBLEMS  UNUSUAL RASH Items with * indicate a potential emergency and should be followed up as soon as possible.  Feel free to call the clinic you have any questions or concerns. The clinic phone number is [phone].    

## 2013-08-27 NOTE — Progress Notes (Signed)
p 

## 2013-08-27 NOTE — Progress Notes (Signed)
Patient had no reaction with Adriamycin, Vinblastine, Bleomycin and Dacarbazine (ABVD).

## 2013-08-27 NOTE — Progress Notes (Signed)
Blood return noted before, during and after Adriamycin. 

## 2013-08-30 ENCOUNTER — Telehealth: Payer: Self-pay | Admitting: *Deleted

## 2013-08-30 ENCOUNTER — Encounter (HOSPITAL_COMMUNITY): Payer: 59

## 2013-08-30 NOTE — Telephone Encounter (Signed)
Has not required any antiemetics and eating well. Voiding well. Mid constipation, but has obtained OTC meds for this now. PICC line looks and feels normal. She flushes it and home health nurse changes dressing weekly. Has no questions or complaints at this time.

## 2013-09-01 ENCOUNTER — Ambulatory Visit: Payer: 59 | Admitting: Hematology and Oncology

## 2013-09-02 ENCOUNTER — Telehealth: Payer: Self-pay | Admitting: *Deleted

## 2013-09-02 NOTE — Telephone Encounter (Signed)
Dad is calling requesting a script for a wig.  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  Also suggested to Miranda Villegas that she may want to look at the [employer] here at [employer].  We can give her a voucher.  He is also looking for human hair wig, suggested that she also get a synthetic one for back up as they are easier to care for.

## 2013-09-06 ENCOUNTER — Other Ambulatory Visit: Payer: Self-pay | Admitting: *Deleted

## 2013-09-06 ENCOUNTER — Ambulatory Visit (HOSPITAL_BASED_OUTPATIENT_CLINIC_OR_DEPARTMENT_OTHER): Payer: 59

## 2013-09-06 ENCOUNTER — Telehealth: Payer: Self-pay | Admitting: *Deleted

## 2013-09-06 ENCOUNTER — Other Ambulatory Visit: Payer: Self-pay | Admitting: Oncology

## 2013-09-06 DIAGNOSIS — Z452 Encounter for adjustment and management of vascular access device: Secondary | ICD-10-CM

## 2013-09-06 DIAGNOSIS — C8190 Hodgkin lymphoma, unspecified, unspecified site: Secondary | ICD-10-CM

## 2013-09-06 DIAGNOSIS — C8110 Nodular sclerosis classical Hodgkin lymphoma, unspecified site: Secondary | ICD-10-CM

## 2013-09-06 MED ORDER — SODIUM CHLORIDE 0.9 % IJ SOLN
3.0000 mL | Freq: Once | INTRAMUSCULAR | Status: AC
  Administered 2013-09-06: 3 mL via INTRAVENOUS
  Filled 2013-09-06: qty 10

## 2013-09-06 MED ORDER — HEPARIN SOD (PORK) LOCK FLUSH 100 UNIT/ML IV SOLN
250.0000 [IU] | Freq: Once | INTRAVENOUS | Status: AC
  Administered 2013-09-06: 250 [IU] via INTRAVENOUS
  Filled 2013-09-06: qty 5

## 2013-09-06 NOTE — Telephone Encounter (Signed)
Received vm call from pt's father this am regarding PICC cath, stating dsg.needs to be changed due to some clotting around site after being lifted.  He reports no soreness or swelling.  Returned call & they had already arranged an appt with flush nurse for today.  He asked if pt could get a port placed instead.  Discussed with Dr Cyndie Chime & port scheduled with radiology/IR for this thurs @ 11:30 am.  They will get a cbc there 1st & they will call pt with instructions. Talked with Tiffany. Mr. Delaney Meigs also asked about pt going out of town 09/14/13 to family & wanted to see what Dr Cyndie Chime thought.  Left message with Mr. Delaney Meigs that this can be discussed with Lonna Cobb NP at visit 09/10/13.

## 2013-09-06 NOTE — Telephone Encounter (Signed)
PICC site has some bleeding and dressing is loose. Home health nurse not scheduled to return till Wednesday. Asking for RN to look at site and change dressing today. Instructed him to bring her at 1245 today.

## 2013-09-06 NOTE — Patient Instructions (Signed)

## 2013-09-07 ENCOUNTER — Other Ambulatory Visit (HOSPITAL_COMMUNITY): Payer: Self-pay | Admitting: Radiology

## 2013-09-07 ENCOUNTER — Telehealth: Payer: Self-pay | Admitting: *Deleted

## 2013-09-07 ENCOUNTER — Other Ambulatory Visit: Payer: Self-pay | Admitting: Radiology

## 2013-09-07 NOTE — Telephone Encounter (Signed)
Ellamarie's Dad, [name] called requesting Nadiyah's diagnosis code and Dr. Patsy Lager tax ID number to put on the wig script to get 100% reimbursement.  He requested that I call back with this information and leave it on his voice mail.  Discussed with Brandt Loosen and we are not permitted to give out physicians tax ID number.  Called him back and left message to that effect and also let him know that the diagnosis code for [place] is 201.50.

## 2013-09-08 ENCOUNTER — Encounter (HOSPITAL_COMMUNITY): Payer: Self-pay | Admitting: Pharmacy Technician

## 2013-09-09 ENCOUNTER — Encounter (HOSPITAL_COMMUNITY): Payer: Self-pay

## 2013-09-09 ENCOUNTER — Ambulatory Visit (HOSPITAL_COMMUNITY)
Admission: RE | Admit: 2013-09-09 | Discharge: 2013-09-09 | Disposition: A | Discharge status: Home / Self Care | Payer: 59 | Source: Ambulatory Visit | Attending: Oncology | Admitting: Oncology

## 2013-09-09 ENCOUNTER — Other Ambulatory Visit: Payer: Self-pay | Admitting: Oncology

## 2013-09-09 DIAGNOSIS — C8190 Hodgkin lymphoma, unspecified, unspecified site: Secondary | ICD-10-CM

## 2013-09-09 DIAGNOSIS — Z79899 Other long term (current) drug therapy: Secondary | ICD-10-CM | POA: Insufficient documentation

## 2013-09-09 LAB — CBC
Hemoglobin: 10.8 g/dL — ABNORMAL LOW (ref 12.0–15.0)
MCHC: 30.7 g/dL (ref 30.0–36.0)
Platelets: 289 10*3/uL (ref 150–400)
RBC: 4.65 MIL/uL (ref 3.87–5.11)
WBC: 1.7 10*3/uL — ABNORMAL LOW (ref 4.0–10.5)

## 2013-09-09 LAB — BASIC METABOLIC PANEL
BUN: 15 mg/dL (ref 6–23)
Calcium: 9 mg/dL (ref 8.4–10.5)
Creatinine, Ser: 0.71 mg/dL (ref 0.50–1.10)
GFR calc Af Amer: >90 mL/min (ref >90)
GFR calc non Af Amer: >90 mL/min (ref >90)
Glucose, Bld: 88 mg/dL (ref 70–99)
Potassium: 4.1 mEq/L (ref 3.5–5.1)
Sodium: 138 mEq/L (ref 135–145)

## 2013-09-09 LAB — PROTIME-INR
INR: 0.89 (ref 0.00–1.49)
Prothrombin Time: 11.9 seconds (ref 11.6–15.2)

## 2013-09-09 MED ORDER — MIDAZOLAM HCL 2 MG/2ML IJ SOLN
INTRAMUSCULAR | Status: AC
  Filled 2013-09-09: qty 4

## 2013-09-09 MED ORDER — HEPARIN SOD (PORK) LOCK FLUSH 100 UNIT/ML IV SOLN
INTRAVENOUS | Status: AC
  Filled 2013-09-09: qty 5

## 2013-09-09 MED ORDER — FENTANYL CITRATE 0.05 MG/ML IJ SOLN
INTRAMUSCULAR | Status: AC | PRN
  Administered 2013-09-09 (×2): 100 ug via INTRAVENOUS

## 2013-09-09 MED ORDER — FENTANYL CITRATE 0.05 MG/ML IJ SOLN
INTRAMUSCULAR | Status: AC
  Filled 2013-09-09: qty 4

## 2013-09-09 MED ORDER — LIDOCAINE-EPINEPHRINE (PF) 2 %-1:200000 IJ SOLN
INTRAMUSCULAR | Status: AC
  Filled 2013-09-09: qty 20

## 2013-09-09 MED ORDER — LIDOCAINE HCL 1 % IJ SOLN
INTRAMUSCULAR | Status: AC
  Filled 2013-09-09: qty 20

## 2013-09-09 MED ORDER — HEPARIN SOD (PORK) LOCK FLUSH 100 UNIT/ML IV SOLN
500.0000 [IU] | Freq: Once | INTRAVENOUS | Status: AC
  Administered 2013-09-09: 500 [IU] via INTRAVENOUS

## 2013-09-09 MED ORDER — CEFAZOLIN SODIUM-DEXTROSE 2-3 GM-% IV SOLR
2.0000 g | Freq: Once | INTRAVENOUS | Status: AC
  Administered 2013-09-09: 2 g via INTRAVENOUS
  Filled 2013-09-09: qty 50

## 2013-09-09 MED ORDER — SODIUM CHLORIDE 0.9 % IV SOLN
Freq: Once | INTRAVENOUS | Status: AC
  Administered 2013-09-09: 10:00:00 via INTRAVENOUS

## 2013-09-09 MED ORDER — MIDAZOLAM HCL 2 MG/2ML IJ SOLN
INTRAMUSCULAR | Status: AC | PRN
  Administered 2013-09-09: 1 mg via INTRAVENOUS
  Administered 2013-09-09: 2 mg via INTRAVENOUS
  Administered 2013-09-09: 1 mg via INTRAVENOUS

## 2013-09-09 NOTE — Procedures (Signed)
Successful placement of right IJ approach port-a-cath with tip at the superior caval atrial junction. The catheter is ready for immediate use. No immediate post procedural complications. 

## 2013-09-09 NOTE — Progress Notes (Signed)
piccline rt arm removed by [facility] [place] before d/c.

## 2013-09-09 NOTE — H&P (Signed)
Miranda Villegas is an 22 y.o. female.   Chief Complaint: "I'm having a port a cath put in" HPI: Patient with history of Hodgkin's lymphoma presents today for port a cath placement for chemotherapy.  Past Medical History  Diagnosis Date  . Mass in neck     right side   . Anxiety   . Depression     as a 22 year old  . Cancer 08/12/13    lymphoma recently dx    Past Surgical History  Procedure Laterality Date  . Tooth extraction    . Lymph node biopsy  08/09/13    medial neck area - lymphoma dx.  . Mediastinoscopy N/A 08/09/2013    Procedure: MEDIASTINOSCOPY;  Surgeon: Loreli Slot, MD;  Location: [facility] OR;  Service: Thoracic;  Laterality: N/A;    Family History  Problem Relation Age of Onset  . Alzheimer's disease    . Osteoarthritis Mother   . Heart disease    . Hearing loss    . Migraines Mother   . Parkinsonism    . Breast cancer    . Transient ischemic attack    . Autoimmune disease     Social History:  reports that she has never smoked. She has never used smokeless tobacco. She reports that she does not drink alcohol or use illicit drugs.  Allergies:  Allergies  Allergen Reactions  . Adhesive [Tape]     Rash    Current outpatient prescriptions:allopurinol (ZYLOPRIM) 300 MG tablet, Take 300 mg by mouth every morning., Disp: , Rfl: ;  loratadine (CLARITIN) 10 MG tablet, Take 10 mg by mouth daily. , Disp: , Rfl: ;  ibuprofen (ADVIL,MOTRIN) 200 MG tablet, Take 400-600 mg by mouth every 6 (six) hours as needed for fever, headache, mild pain, moderate pain or cramping. , Disp: , Rfl:  LORazepam (ATIVAN) 1 MG tablet, Take 1 tablet (1 mg total) by mouth every 6 (six) hours as needed for anxiety or sleep., Disp: 30 tablet, Rfl: 0;  mupirocin nasal ointment (BACTROBAN) 2 %, Place 1 application into the nose 2 (two) times daily. Use one-half of tube in each nostril twice daily for five (5) days. After application, press sides of nose together and gently massage., Disp: , Rfl:   ondansetron (ZOFRAN ODT) 8 MG disintegrating tablet, Take 1 tablet (8 mg total) by mouth every 8 (eight) hours as needed for nausea or vomiting (Take every 8 hours for the next 48 hours, then as needed.)., Disp: 30 tablet, Rfl: 3;  oxycodone (OXY-IR) 5 MG capsule, Take 1 capsule (5 mg total) by mouth every 4 (four) hours as needed., Disp: 30 capsule, Rfl: 0 promethazine (PHENERGAN) 25 MG tablet, Take 1 tablet (25 mg total) by mouth every 6 (six) hours as needed for refractory nausea / vomiting., Disp: 30 tablet, Rfl: 3;  triamcinolone cream (KENALOG) 0.1 %, Apply 1 application topically daily as needed (for eczema flare ups)., Disp: , Rfl:  Current facility-administered medications:ceFAZolin (ANCEF) IVPB 2 g/50 mL premix, 2 g, Intravenous, Once, Robet Leu, PA-C   Results for orders placed during the hospital encounter of 09/09/13 (from the past 48 hour(s))  APTT     Status: None   Collection Time    09/09/13  9:40 AM      Result Value Range   aPTT 27  24 - 37 seconds  BASIC METABOLIC PANEL     Status: None   Collection Time    09/09/13  9:40 AM  Result Value Range   Sodium 138  135 - 145 mEq/L   Potassium 4.1  3.5 - 5.1 mEq/L   Chloride 105  96 - 112 mEq/L   CO2 24  19 - 32 mEq/L   Glucose, Bld 88  70 - 99 mg/dL   BUN 15  6 - 23 mg/dL   Creatinine, Ser 1.61  0.50 - 1.10 mg/dL   Calcium 9.0  8.4 - 09.6 mg/dL   GFR calc non Af Amer >90  >90 mL/min   GFR calc Af Amer >90  >90 mL/min   Comment: (NOTE)     The eGFR has been calculated using the CKD EPI equation.     This calculation has not been validated in all clinical situations.     eGFR's persistently <90 mL/min signify possible Chronic Kidney     Disease.  CBC     Status: Abnormal   Collection Time    09/09/13  9:40 AM      Result Value Range   WBC 1.7 (*) 4.0 - 10.5 K/uL   RBC 4.65  3.87 - 5.11 MIL/uL   Hemoglobin 10.8 (*) 12.0 - 15.0 g/dL   HCT 04.5 (*) 40.9 - 81.1 %   MCV 75.7 (*) 78.0 - 100.0 fL   MCH 23.2 (*)  26.0 - 34.0 pg   MCHC 30.7  30.0 - 36.0 g/dL   RDW 91.4 (*) 78.2 - 95.6 %   Platelets 289  150 - 400 K/uL  PROTIME-INR     Status: None   Collection Time    09/09/13  9:40 AM      Result Value Range   Prothrombin Time 11.9  11.6 - 15.2 seconds   INR 0.89  0.00 - 1.49   No results found.  Review of Systems  Constitutional: Negative for fever and chills.  Respiratory: Negative for hemoptysis.        Occ dry cough, dyspnea  Cardiovascular: Negative for chest pain.  Gastrointestinal: Negative for nausea, vomiting and abdominal pain.  Musculoskeletal: Negative for back pain.  Neurological: Negative for headaches.  Endo/Heme/Allergies: Does not bruise/bleed easily.    Blood pressure 120/70, pulse 70, temperature 97.4 F (36.3 C), temperature source Oral, resp. rate 18, height 5\' 9"  (1.753 m), weight 218 lb (98.884 kg), last menstrual period 08/03/2013, SpO2 99.00%. Physical Exam  Constitutional: She is oriented to person, place, and time. She appears well-developed and well-nourished.  Cardiovascular: Normal rate and regular rhythm.   Respiratory: Effort normal and breath sounds normal.  GI: Soft. Bowel sounds are normal. There is no tenderness.  Musculoskeletal: Normal range of motion. She exhibits no edema.  Neurological: She is alert and oriented to person, place, and time.     Assessment/Plan Patient with history of Hodgkin's lymphoma presents today for port a cath placement for chemotherapy. Details/risks of procedure d/w pt/father with their understanding and consent.   Kimiah Hibner,D KEVIN 09/09/2013, 11:12 AM

## 2013-09-10 ENCOUNTER — Telehealth: Payer: Self-pay | Admitting: *Deleted

## 2013-09-10 ENCOUNTER — Ambulatory Visit (HOSPITAL_BASED_OUTPATIENT_CLINIC_OR_DEPARTMENT_OTHER): Payer: 59 | Admitting: Nurse Practitioner

## 2013-09-10 ENCOUNTER — Ambulatory Visit: Payer: 59

## 2013-09-10 ENCOUNTER — Telehealth: Payer: Self-pay | Admitting: Oncology

## 2013-09-10 ENCOUNTER — Encounter (INDEPENDENT_AMBULATORY_CARE_PROVIDER_SITE_OTHER): Payer: Self-pay

## 2013-09-10 ENCOUNTER — Other Ambulatory Visit (HOSPITAL_BASED_OUTPATIENT_CLINIC_OR_DEPARTMENT_OTHER): Payer: 59

## 2013-09-10 VITALS — BP 107/69 | HR 67 | Temp 97.1°F | Resp 17 | Ht 69.0 in | Wt 223.4 lb

## 2013-09-10 DIAGNOSIS — C8119 Nodular sclerosis classical Hodgkin lymphoma, extranodal and solid organ sites: Secondary | ICD-10-CM

## 2013-09-10 DIAGNOSIS — C8110 Nodular sclerosis classical Hodgkin lymphoma, unspecified site: Secondary | ICD-10-CM

## 2013-09-10 DIAGNOSIS — D702 Other drug-induced agranulocytosis: Secondary | ICD-10-CM

## 2013-09-10 LAB — COMPREHENSIVE METABOLIC PANEL (CC13)
ALT: 13 U/L (ref 0–55)
Albumin: 3.7 g/dL (ref 3.5–5.0)
Alkaline Phosphatase: 86 U/L (ref 40–150)
Anion Gap: 10 mEq/L (ref 3–11)
CO2: 24 mEq/L (ref 22–29)
Chloride: 105 mEq/L (ref 98–109)
Glucose: 83 mg/dl (ref 70–140)
Potassium: 4 mEq/L (ref 3.5–5.1)
Sodium: 139 mEq/L (ref 136–145)
Total Bilirubin: 0.24 mg/dL (ref 0.20–1.20)
Total Protein: 6.9 g/dL (ref 6.4–8.3)

## 2013-09-10 LAB — CBC WITH DIFFERENTIAL/PLATELET
Basophils Absolute: 0.1 10*3/uL (ref 0.0–0.1)
EOS%: 3.5 % (ref 0.0–7.0)
Eosinophils Absolute: 0.1 10*3/uL (ref 0.0–0.5)
HGB: 11.6 g/dL (ref 11.6–15.9)
MONO#: 0.4 10*3/uL (ref 0.1–0.9)
MONO%: 20.2 % — ABNORMAL HIGH (ref 0.0–14.0)
NEUT#: 0.3 10*3/uL — CL (ref 1.5–6.5)
RBC: 5.06 10*6/uL (ref 3.70–5.45)
RDW: 18.4 % — ABNORMAL HIGH (ref 11.2–14.5)
WBC: 1.7 10*3/uL — ABNORMAL LOW (ref 3.9–10.3)
lymph#: 1 10*3/uL (ref 0.9–3.3)
nRBC: 0 % (ref 0–0)

## 2013-09-10 MED ORDER — PEGFILGRASTIM INJECTION 6 MG/0.6ML
6.0000 mg | Freq: Once | SUBCUTANEOUS | Status: DC
  Filled 2013-09-10: qty 0.6

## 2013-09-10 MED ORDER — LIDOCAINE-PRILOCAINE 2.5-2.5 % EX CREA
TOPICAL_CREAM | CUTANEOUS | Status: AC
  Filled 2013-09-10: qty 5

## 2013-09-10 MED ORDER — LIDOCAINE-PRILOCAINE 2.5-2.5 % EX CREA: 1.0000 "application " | TOPICAL_CREAM | CUTANEOUS | Status: DC | PRN

## 2013-09-10 MED ORDER — CIPROFLOXACIN HCL 500 MG PO TABS: 500.0000 mg | ORAL_TABLET | Freq: Two times a day (BID) | ORAL | Status: DC

## 2013-09-10 NOTE — Telephone Encounter (Signed)
Per staff message and POF I have scheduled appts.  JMW  

## 2013-09-10 NOTE — Progress Notes (Addendum)
OFFICE PROGRESS NOTE  Interval history:  [name] is a 22 year old woman recently diagnosed with stage IIIB Hodgkin's lymphoma. Please see office note dated 08/20/2013 for the specifics of diagnosis.  She completed cycle 1 day 15 ABVD on 08/27/2013. She is seen today for scheduled followup.  She had no nausea or vomiting following the most recent chemotherapy. No further headaches. She did again become constipated. This was relieved with a laxative. She denies any fevers or sweats. No shaking chills. No hematuria or dysuria. She began her menstrual cycle earlier today. A few days ago she had mild dyspnea on exertion. This has resolved. No cough. No chest pain. She continues to have occasional dizziness if she stands too quickly. Main complaint is fatigue.  She had a Port-A-Cath placed yesterday. She is experiencing some discomfort around the Port-A-Cath site. PICC catheter was removed yesterday.   Objective: Blood pressure 107/69, pulse 67, temperature 97.1 F (36.2 C), temperature source Oral, resp. rate 17, height 5\' 9"  (1.753 m), weight 223 lb 6.4 oz (101.334 kg), last menstrual period 08/03/2013.  Oropharynx is without thrush or ulceration. No palpable cervical, supraclavicular or right axillary adenopathy. Approximate 1-2 cm left axillary lymph node. Lungs are clear. No wheezes or rales. Regular cardiac rhythm. Port-A-Cath site covered with a bandage. The edge of the bandage was lifted. No erythema noted. Motor strength 5 over 5.  Lab Results: Lab Results  Component Value Date   WBC 1.7* 09/10/2013   HGB 11.6 09/10/2013   HCT 38.6 09/10/2013   MCV 76.3* 09/10/2013   PLT 294 09/10/2013    Chemistry:    Chemistry      Component Value Date/Time   NA 139 09/10/2013 1016   NA 138 09/09/2013 0940   K 4.0 09/10/2013 1016   K 4.1 09/09/2013 0940   CL 105 09/09/2013 0940   CO2 24 09/10/2013 1016   CO2 24 09/09/2013 0940   BUN 12.2 09/10/2013 1016   BUN 15 09/09/2013 0940    CREATININE 0.8 09/10/2013 1016   CREATININE 0.71 09/09/2013 0940      Component Value Date/Time   CALCIUM 9.5 09/10/2013 1016   CALCIUM 9.0 09/09/2013 0940   ALKPHOS 86 09/10/2013 1016   ALKPHOS 85 08/15/2013 0515   AST 12 09/10/2013 1016   AST 8 08/15/2013 0515   ALT 13 09/10/2013 1016   ALT 16 08/15/2013 0515   BILITOT 0.24 09/10/2013 1016   BILITOT 0.1* 08/15/2013 0515       Studies/Results: Dg Chest 2 View  08/12/2013   CLINICAL DATA:  Mediastinal mass biopsy 3 days ago. Short of breath and increased swelling of neck  EXAM: CHEST  2 VIEW  COMPARISON:  08/06/2013  FINDINGS: Large anterior mediastinal lymph node mass is unchanged allowing for AP projection.  Negative for infiltrate effusion. Negative for pneumothorax. If there is concern about SVC syndrome, consider CT chest with contrast.  IMPRESSION: Large anterior mediastinal mass is unchanged. No change from the prior chest x-ray.   Electronically Signed   By: Marlan Palau M.D.   On: 08/12/2013 13:42   Ct Soft Tissue Neck W Contrast  08/12/2013   CLINICAL DATA:  Lymphoma.  Recent biopsy.  Difficulty breathing.  EXAM: CT NECK WITH CONTRAST  TECHNIQUE: Multidetector CT imaging of the neck was performed using the standard protocol following the bolus administration of intravenous contrast.  CONTRAST:  80mL OMNIPAQUE IOHEXOL 300 MG/ML  SOLN  COMPARISON:  CT chest 07/30/2013.  FINDINGS: Extensive inflammatory reaction accompanies widespread  right greater than left cervical lymphadenopathy. This is maximal in right level II and II with conglomerate mass on the right level II station measuring 28 x 32 mm. Edema of the right greater than left vallecula, aryepiglottic fold, and vocal cords significantly narrows the airway at the level of the glottis. There is slight medialization of the right true vocal cord. Right recurrent laryngeal nerve palsy not excluded due to severe adenopathy.  Moderate mass effect on the right internal jugular  without obstruction. Extensive mediastinal adenopathy persists. Normal-appearing thyroid. Negative intracranial compartment. No osseous findings.  IMPRESSION: Extensive inflammatory reaction accompanying widespread right greater than left cervical adenopathy. Hodgkin's lymphoma is favored. Final pathology from previous mediastinal biopsy not yet available.  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 Slight medialization right true vocal cord could indicate recurrent laryngeal nerve palsy. ENT consultation for airway assessment may be warranted.   Electronically Signed   By: Davonna Belling M.D.   On: 08/12/2013 14:02   Ct Abdomen Pelvis W Contrast  08/13/2013   CLINICAL DATA:  Staging for Hodgkin's lymphoma.  EXAM: CT ABDOMEN AND PELVIS WITH CONTRAST  TECHNIQUE: Multidetector CT imaging of the abdomen and pelvis was performed using the standard protocol following bolus administration of intravenous contrast.  CONTRAST:  50mL OMNIPAQUE IOHEXOL 300 MG/ML SOLN, OMNIPAQUE IOHEXOL 300 MG/ML SOLN  COMPARISON:  No priors.  FINDINGS: Lung Bases: Dependent atelectasis in the left lower lobe. Otherwise, unremarkable.  Abdomen/Pelvis: The appearance of the liver, gallbladder, pancreas, bilateral adrenal glands and bilateral kidneys is unremarkable. There are 2 intermediate attenuation lesions in the spleen, largest of which is on image 17 of series 2 near the splenic hilum measuring 2.1 cm in diameter. No significant volume of ascites. No pneumoperitoneum. No pathologic distention of small bowel. Normal appendix. The appearance of the uterus and bilateral ovaries is unremarkable. No definite lymphadenopathy identified within the abdomen or pelvis.  Musculoskeletal: There are no aggressive appearing lytic or blastic lesions noted in the visualized portions of the skeleton.  IMPRESSION: 1. Two intermediate attenuation  lesions in the spleen are concerning in the setting of a known diagnosis of lymphoma, and may represent focal lymphomatous lesions. 2. No lymphadenopathy identified in the abdomen or pelvis.   Electronically Signed   By: Trudie Reed M.D.   On: 08/13/2013 20:37   Ir Fluoro Guide Cv Line Right  09/09/2013   INDICATION: Hodgkin's lymphoma, in need of intravenous access for chemotherapy administration  EXAM: 1. IMPLANTED PORT A CATH PLACEMENT WITH ULTRASOUND AND FLUOROSCOPIC GUIDANCE  2. BEDSIDE REMOVAL OF EXISTING RIGHT UPPER EXTREMITY APPROACH PICC LINE  MEDICATIONS: Ancef 2 gm IV; IV antibiotic was given in an appropriate time interval prior to skin puncture.  ANESTHESIA/SEDATION: Versed 4 mg IV; Fentanyl 200 mcg IV;  Total Moderate Sedation Time  30  minutes.  CONTRAST:  None  COMPARISON:  PET-CT - 08/26/2013  FLUOROSCOPY TIME:  48 seconds  PROCEDURE: The procedure, risks, benefits, and alternatives were explained to the patient. Questions regarding the procedure were encouraged and answered. The patient understands and consents to the procedure.  The right neck and chest were prepped with chlorhexidine in a sterile fashion, and a sterile drape was applied covering the operative field. Maximum barrier sterile technique with sterile gowns and gloves were used for the procedure. A timeout was performed prior to the initiation of the procedure. Local anesthesia was provided with 1%  lidocaine with epinephrine.  After creating a small venotomy incision, a micropuncture kit was utilized to access the internal jugular vein under direct, real-time ultrasound guidance. Ultrasound image documentation was performed. The microwire was kinked to measure appropriate catheter length.  A subcutaneous port pocket was then created along the upper chest wall utilizing a combination of sharp and blunt dissection. The pocket was irrigated with sterile saline. A single lumen power injectable port was chosen for placement. The 8  Fr catheter was tunneled from the port pocket site to the venotomy incision. The port was placed in the pocket. The external catheter was trimmed to appropriate length. At the venotomy, an 8 Fr peel-away sheath was placed over a guidewire under fluoroscopic guidance. The catheter was then placed through the sheath and the sheath was removed. Final catheter positioning was confirmed and documented with a fluoroscopic spot radiograph. The port was accessed with a Huber needle, aspirated and flushed with heparinized saline.  The venotomy site was closed with an interrupted 4-0 Vicryl suture. The port pocket incision was closed with interrupted 2-0 Vicryl suture and the skin was opposed with a running subcuticular 4-0 Vicryl suture. Dermabond and Steri-strips were applied to both incisions. Dressings were placed. The patient tolerated the procedure well without immediate post procedural complication.  The existing right upper extremity approach PICC line was subsequently removed upon patient's arrival to the short-stay unit. A dressing was placed.  COMPLICATIONS: None immediate  FINDINGS: After catheter placement, the tip lies within the superior cavoatrial junction. The catheter aspirates and flushes normally and is ready for immediate use.  Successful bedside removal of existing right upper extremity approach PICC line.  IMPRESSION: 1. Successful placement of a right internal jugular approach power injectable Port-A-Cath. The catheter is ready for immediate use.  2. Successful bedside removal of existing right upper extremity approach PICC line.   Electronically Signed   By: Simonne Come M.D.   On: 09/09/2013 16:33   Ir US Guide Vasc Access Right  09/09/2013   INDICATION: Hodgkin's lymphoma, in need of intravenous access for chemotherapy administration  EXAM: 1. IMPLANTED PORT A CATH PLACEMENT WITH ULTRASOUND AND FLUOROSCOPIC GUIDANCE  2. BEDSIDE REMOVAL OF EXISTING RIGHT UPPER EXTREMITY APPROACH PICC LINE   MEDICATIONS: Ancef 2 gm IV; IV antibiotic was given in an appropriate time interval prior to skin puncture.  ANESTHESIA/SEDATION: Versed 4 mg IV; Fentanyl 200 mcg IV;  Total Moderate Sedation Time  30  minutes.  CONTRAST:  None  COMPARISON:  PET-CT - 08/26/2013  FLUOROSCOPY TIME:  48 seconds  PROCEDURE: The procedure, risks, benefits, and alternatives were explained to the patient. Questions regarding the procedure were encouraged and answered. The patient understands and consents to the procedure.  The right neck and chest were prepped with chlorhexidine in a sterile fashion, and a sterile drape was applied covering the operative field. Maximum barrier sterile technique with sterile gowns and gloves were used for the procedure. A timeout was performed prior to the initiation of the procedure. Local anesthesia was provided with 1% lidocaine with epinephrine.  After creating a small venotomy incision, a micropuncture kit was utilized to access the internal jugular vein under direct, real-time ultrasound guidance. Ultrasound image documentation was performed. The microwire was kinked to measure appropriate catheter length.  A subcutaneous port pocket was then created along the upper chest wall utilizing a combination of sharp and blunt dissection. The pocket was irrigated with sterile saline. A single lumen power injectable port was chosen for placement.  The 8 Fr catheter was tunneled from the port pocket site to the venotomy incision. The port was placed in the pocket. The external catheter was trimmed to appropriate length. At the venotomy, an 8 Fr peel-away sheath was placed over a guidewire under fluoroscopic guidance. The catheter was then placed through the sheath and the sheath was removed. Final catheter positioning was confirmed and documented with a fluoroscopic spot radiograph. The port was accessed with a Huber needle, aspirated and flushed with heparinized saline.  The venotomy site was closed with an  interrupted 4-0 Vicryl suture. The port pocket incision was closed with interrupted 2-0 Vicryl suture and the skin was opposed with a running subcuticular 4-0 Vicryl suture. Dermabond and Steri-strips were applied to both incisions. Dressings were placed. The patient tolerated the procedure well without immediate post procedural complication.  The existing right upper extremity approach PICC line was subsequently removed upon patient's arrival to the short-stay unit. A dressing was placed.  COMPLICATIONS: None immediate  FINDINGS: After catheter placement, the tip lies within the superior cavoatrial junction. The catheter aspirates and flushes normally and is ready for immediate use.  Successful bedside removal of existing right upper extremity approach PICC line.  IMPRESSION: 1. Successful placement of a right internal jugular approach power injectable Port-A-Cath. The catheter is ready for immediate use.  2. Successful bedside removal of existing right upper extremity approach PICC line.   Electronically Signed   By: Simonne Come M.D.   On: 09/09/2013 16:33   Ir US Guide Vasc Access Right  08/13/2013   CLINICAL DATA:  Hodgkin's lymphoma, poor venous access; central venous access is requested for fluids and medications.  EXAM: Right upper extremity power PICC LINE PLACEMENT WITH ULTRASOUND AND FLUOROSCOPIC GUIDANCE  FLUOROSCOPY TIME:  42 seconds  PROCEDURE: The patient was advised of the possible risks and complications and agreed to undergo the procedure. The patient was then brought to the angiographic suite for the procedure.  The right arm was prepped with chlorhexidine, draped in the usual sterile fashion using maximum barrier technique (cap and mask, sterile gown, sterile gloves, large sterile sheet, hand hygiene and cutaneous antisepsis) and infiltrated locally with 1% Lidocaine.  Ultrasound demonstrated patency of the right basilic vein, and this was documented with an image. Under real-time ultrasound  guidance, this vein was accessed with a 21 gauge micropuncture needle and image documentation was performed. A 0.018 wire was introduced in to the vein. Over this, a 5 [place] double lumen 41 cm power injectable PICC was advanced to the lower SVC/right atrial junction. Fluoroscopy during the procedure and fluoro spot radiograph confirms appropriate catheter position. The catheter was flushed and covered with a sterile dressing.  Complications: None  IMPRESSION: Successful right arm power PICC line placement with ultrasound and fluoroscopic guidance. The catheter is ready for use  Read by: Jeananne Rama ,P.A.-C.   Electronically Signed   By: Irish Lack M.D.   On: 08/13/2013 16:43   Ir Fluoro Guide Cv Midline Picc Right  08/13/2013   CLINICAL DATA:  Hodgkin's lymphoma, poor venous access; central venous access is requested for fluids and medications.  EXAM: Right upper extremity power PICC LINE PLACEMENT WITH ULTRASOUND AND FLUOROSCOPIC GUIDANCE  FLUOROSCOPY TIME:  42 seconds  PROCEDURE: The patient was advised of the possible risks and complications and agreed to undergo the procedure. The patient was then brought to the angiographic suite for the procedure.  The right arm was prepped with chlorhexidine, draped in the usual sterile  fashion using maximum barrier technique (cap and mask, sterile gown, sterile gloves, large sterile sheet, hand hygiene and cutaneous antisepsis) and infiltrated locally with 1% Lidocaine.  Ultrasound demonstrated patency of the right basilic vein, and this was documented with an image. Under real-time ultrasound guidance, this vein was accessed with a 21 gauge micropuncture needle and image documentation was performed. A 0.018 wire was introduced in to the vein. Over this, a 5 [place] double lumen 41 cm power injectable PICC was advanced to the lower SVC/right atrial junction. Fluoroscopy during the procedure and fluoro spot radiograph confirms appropriate catheter position. The  catheter was flushed and covered with a sterile dressing.  Complications: None  IMPRESSION: Successful right arm power PICC line placement with ultrasound and fluoroscopic guidance. The catheter is ready for use  Read by: Jeananne Rama ,P.A.-C.   Electronically Signed   By: Irish Lack M.D.   On: 08/13/2013 16:43    Medications: I have reviewed the patient's current medications.  Assessment/Plan:  1. Stage IIIB Hodgkin's lymphoma.  Cycle 1 day 1 ABVD 08/14/2013.  Cycle 1 day 15 ABVD 08/27/2013. 2. Respiratory distress secondary to bulky cervical and anterior mediastinal lymphadenopathy initially treated with steroids with improvement; now resolved. 3. Headache following cycle 1 day 1 ABVD. Likely secondary to Zofran. Zofran was discontinued from the treatment plan beginning cycle 1 day 15 and Aloxi was added. She did not experience a headache following the cycle 1 day 15 treatment. 4. Delayed nausea following cycle 1 day 1 ABVD. Resolved with the addition of Aloxi cycle 1 day 15. 5. Tachycardia when here 08/20/2013. Question etiology. She appeared asymptomatic. Heart rate in normal range today. 6. Status post PICC placement 08/13/2013 and removal 09/09/2013.  7. Anemia, microcytic with iron less than 10 and ferritin 138 on 08/12/2013. 8. Status post Port-A-Cath placement 09/09/2013. 9. Neutropenia 09/10/2013 secondary to chemotherapy.  Disposition-she appears stable. She has completed 1 full cycle of ABVD. She is neutropenic on labs today. We will delay the start of cycle 2 for one week. She will return for cycle 2 day 1 ABVD 09/17/2013. Neulasta support will be added every other treatment beginning 09/18/2013.  We are placing her on Cipro 500 mg twice daily prophylactically. She understands to contact the office with fever greater than 101, shaking chills, other signs of infection.  She will be seen in followup prior to beginning cycle 3 on 10/15/2013. She will contact the office in the  interim as outlined above or with any other problems.   Patient seen with Dr. Cyndie Chime.  Lonna Cobb ANP/GNP-BC   Hematology oncology attending: I personally interviewed and examined this patient and discussed management with her and her father. History, physical, disposition, accurate as recorded above diagnosis practitioner. She's had two chemotherapy infusions so far. She is tolerating treatments well. We are, however, seeing cumulative myelotoxicity. Absolute neutrophil count of 300 today. I will hold treatment. Reschedule for next week. Start prophylactic oral antibiotics. Add Neulasta once monthly to begin on the day after her next treatment. We reviewed infectious risks and need to call the doctor if fever 101 or above. Monocyte count 20% of the differential so hopefully her counts will recover soon.   Cephas Darby, MD, FACP  Hematology-Oncology

## 2013-09-10 NOTE — Telephone Encounter (Signed)
appts made per 12/19 POF AVS and CAL given shh °

## 2013-09-17 ENCOUNTER — Other Ambulatory Visit: Payer: Self-pay | Admitting: Nurse Practitioner

## 2013-09-17 ENCOUNTER — Ambulatory Visit (HOSPITAL_BASED_OUTPATIENT_CLINIC_OR_DEPARTMENT_OTHER): Payer: 59

## 2013-09-17 ENCOUNTER — Other Ambulatory Visit (HOSPITAL_BASED_OUTPATIENT_CLINIC_OR_DEPARTMENT_OTHER): Payer: 59

## 2013-09-17 VITALS — BP 120/58 | HR 95 | Temp 98.2°F | Resp 18

## 2013-09-17 DIAGNOSIS — C8119 Nodular sclerosis classical Hodgkin lymphoma, extranodal and solid organ sites: Secondary | ICD-10-CM

## 2013-09-17 DIAGNOSIS — C8110 Nodular sclerosis classical Hodgkin lymphoma, unspecified site: Secondary | ICD-10-CM

## 2013-09-17 DIAGNOSIS — C8190 Hodgkin lymphoma, unspecified, unspecified site: Secondary | ICD-10-CM

## 2013-09-17 DIAGNOSIS — Z5111 Encounter for antineoplastic chemotherapy: Secondary | ICD-10-CM

## 2013-09-17 DIAGNOSIS — R590 Localized enlarged lymph nodes: Secondary | ICD-10-CM

## 2013-09-17 LAB — CBC WITH DIFFERENTIAL/PLATELET
BASO%: 0.9 % (ref 0.0–2.0)
EOS%: 0.7 % (ref 0.0–7.0)
Eosinophils Absolute: 0 10*3/uL (ref 0.0–0.5)
MONO#: 0.4 10*3/uL (ref 0.1–0.9)
NEUT#: 3.2 10*3/uL (ref 1.5–6.5)
Platelets: 264 10*3/uL (ref 145–400)
RBC: 5.03 10*6/uL (ref 3.70–5.45)
RDW: 18.9 % — ABNORMAL HIGH (ref 11.2–14.5)
WBC: 4.5 10*3/uL (ref 3.9–10.3)

## 2013-09-17 MED ORDER — DEXAMETHASONE SODIUM PHOSPHATE 20 MG/5ML IJ SOLN
INTRAMUSCULAR | Status: AC
  Filled 2013-09-17: qty 5

## 2013-09-17 MED ORDER — SODIUM CHLORIDE 0.9 % IV SOLN
10.0000 [IU]/m2 | Freq: Once | INTRAVENOUS | Status: AC
  Administered 2013-09-17: 22 [IU] via INTRAVENOUS
  Filled 2013-09-17: qty 7.33

## 2013-09-17 MED ORDER — DEXAMETHASONE SODIUM PHOSPHATE 20 MG/5ML IJ SOLN
20.0000 mg | Freq: Once | INTRAMUSCULAR | Status: AC
  Administered 2013-09-17: 20 mg via INTRAVENOUS

## 2013-09-17 MED ORDER — VINBLASTINE SULFATE CHEMO INJECTION 1 MG/ML
5.9000 mg/m2 | Freq: Once | INTRAVENOUS | Status: AC
  Administered 2013-09-17: 13 mg via INTRAVENOUS
  Filled 2013-09-17: qty 13

## 2013-09-17 MED ORDER — PALONOSETRON HCL INJECTION 0.25 MG/5ML
INTRAVENOUS | Status: AC
  Filled 2013-09-17: qty 5

## 2013-09-17 MED ORDER — HEPARIN SOD (PORK) LOCK FLUSH 100 UNIT/ML IV SOLN
500.0000 [IU] | Freq: Once | INTRAVENOUS | Status: AC | PRN
  Administered 2013-09-17: 500 [IU]
  Filled 2013-09-17: qty 5

## 2013-09-17 MED ORDER — DOXORUBICIN HCL CHEMO IV INJECTION 2 MG/ML
25.0000 mg/m2 | Freq: Once | INTRAVENOUS | Status: AC
  Administered 2013-09-17: 56 mg via INTRAVENOUS
  Filled 2013-09-17: qty 28

## 2013-09-17 MED ORDER — SODIUM CHLORIDE 0.9 % IV SOLN
Freq: Once | INTRAVENOUS | Status: AC
  Administered 2013-09-17: 11:00:00 via INTRAVENOUS

## 2013-09-17 MED ORDER — PALONOSETRON HCL INJECTION 0.25 MG/5ML
0.2500 mg | Freq: Once | INTRAVENOUS | Status: AC
  Administered 2013-09-17: 0.25 mg via INTRAVENOUS

## 2013-09-17 MED ORDER — SODIUM CHLORIDE 0.9 % IJ SOLN
10.0000 mL | INTRAMUSCULAR | Status: DC | PRN
  Administered 2013-09-17: 10 mL
  Filled 2013-09-17: qty 10

## 2013-09-17 MED ORDER — SODIUM CHLORIDE 0.9 % IV SOLN
375.0000 mg/m2 | Freq: Once | INTRAVENOUS | Status: AC
  Administered 2013-09-17: 820 mg via INTRAVENOUS
  Filled 2013-09-17: qty 41

## 2013-09-17 NOTE — Patient Instructions (Signed)
[facility] Discharge Instructions for Patients Receiving Chemotherapy  Today you received the following chemotherapy agents AVBD To help prevent nausea and vomiting after your treatment, we encourage you to take your nausea medication as needed If you develop nausea and vomiting that is not controlled by your nausea medication, call the clinic.   BELOW ARE SYMPTOMS THAT SHOULD BE REPORTED IMMEDIATELY:  *FEVER GREATER THAN 100.5 F  *CHILLS WITH OR WITHOUT FEVER  NAUSEA AND VOMITING THAT IS NOT CONTROLLED WITH YOUR NAUSEA MEDICATION  *UNUSUAL SHORTNESS OF BREATH  *UNUSUAL BRUISING OR BLEEDING  TENDERNESS IN MOUTH AND THROAT WITH OR WITHOUT PRESENCE OF ULCERS  *URINARY PROBLEMS  *BOWEL PROBLEMS  UNUSUAL RASH Items with * indicate a potential emergency and should be followed up as soon as possible.  Feel free to call the clinic you have any questions or concerns. The clinic phone number is [phone].

## 2013-09-18 ENCOUNTER — Ambulatory Visit (HOSPITAL_BASED_OUTPATIENT_CLINIC_OR_DEPARTMENT_OTHER): Payer: 59

## 2013-09-18 VITALS — BP 124/72 | HR 85 | Resp 20

## 2013-09-18 DIAGNOSIS — D702 Other drug-induced agranulocytosis: Secondary | ICD-10-CM

## 2013-09-18 MED ORDER — PEGFILGRASTIM INJECTION 6 MG/0.6ML
6.0000 mg | Freq: Once | SUBCUTANEOUS | Status: AC
  Administered 2013-09-18: 6 mg via SUBCUTANEOUS

## 2013-09-18 NOTE — Progress Notes (Signed)
Patient awoke with minimal nausea this am; took Phenergan to relieve; no present complaints of nausea.

## 2013-09-24 ENCOUNTER — Ambulatory Visit: Payer: 59

## 2013-09-24 ENCOUNTER — Other Ambulatory Visit: Payer: 59

## 2013-10-01 ENCOUNTER — Ambulatory Visit (HOSPITAL_BASED_OUTPATIENT_CLINIC_OR_DEPARTMENT_OTHER): Payer: 59

## 2013-10-01 ENCOUNTER — Other Ambulatory Visit (HOSPITAL_BASED_OUTPATIENT_CLINIC_OR_DEPARTMENT_OTHER): Payer: 59

## 2013-10-01 VITALS — BP 124/68 | HR 91 | Temp 98.4°F | Resp 20

## 2013-10-01 DIAGNOSIS — Z5111 Encounter for antineoplastic chemotherapy: Secondary | ICD-10-CM

## 2013-10-01 DIAGNOSIS — R590 Localized enlarged lymph nodes: Secondary | ICD-10-CM

## 2013-10-01 DIAGNOSIS — C8119 Nodular sclerosis classical Hodgkin lymphoma, extranodal and solid organ sites: Secondary | ICD-10-CM

## 2013-10-01 DIAGNOSIS — C8110 Nodular sclerosis classical Hodgkin lymphoma, unspecified site: Secondary | ICD-10-CM

## 2013-10-01 DIAGNOSIS — C8190 Hodgkin lymphoma, unspecified, unspecified site: Secondary | ICD-10-CM

## 2013-10-01 LAB — CBC WITH DIFFERENTIAL/PLATELET
BASO%: 1.2 % (ref 0.0–2.0)
Basophils Absolute: 0.1 10*3/uL (ref 0.0–0.1)
EOS ABS: 0.2 10*3/uL (ref 0.0–0.5)
EOS%: 2.1 % (ref 0.0–7.0)
HEMATOCRIT: 41.1 % (ref 34.8–46.6)
HEMOGLOBIN: 13.1 g/dL (ref 11.6–15.9)
LYMPH#: 1.7 10*3/uL (ref 0.9–3.3)
LYMPH%: 19.6 % (ref 14.0–49.7)
MCH: 24.3 pg — AB (ref 25.1–34.0)
MCHC: 31.9 g/dL (ref 31.5–36.0)
MCV: 76.1 fL — ABNORMAL LOW (ref 79.5–101.0)
MONO#: 0.6 10*3/uL (ref 0.1–0.9)
MONO%: 6.3 % (ref 0.0–14.0)
NEUT%: 70.8 % (ref 38.4–76.8)
NEUTROS ABS: 6.3 10*3/uL (ref 1.5–6.5)
Platelets: 291 10*3/uL (ref 145–400)
RBC: 5.4 10*6/uL (ref 3.70–5.45)
RDW: 20.1 % — AB (ref 11.2–14.5)
WBC: 8.9 10*3/uL (ref 3.9–10.3)

## 2013-10-01 MED ORDER — SODIUM CHLORIDE 0.9 % IV SOLN
10.0000 [IU]/m2 | Freq: Once | INTRAVENOUS | Status: AC
  Administered 2013-10-01: 22 [IU] via INTRAVENOUS
  Filled 2013-10-01: qty 7.33

## 2013-10-01 MED ORDER — SODIUM CHLORIDE 0.9 % IV SOLN
Freq: Once | INTRAVENOUS | Status: AC
  Administered 2013-10-01: 11:00:00 via INTRAVENOUS

## 2013-10-01 MED ORDER — PALONOSETRON HCL INJECTION 0.25 MG/5ML
INTRAVENOUS | Status: AC
  Filled 2013-10-01: qty 5

## 2013-10-01 MED ORDER — SODIUM CHLORIDE 0.9 % IJ SOLN
10.0000 mL | INTRAMUSCULAR | Status: DC | PRN
  Administered 2013-10-01: 10 mL
  Filled 2013-10-01: qty 10

## 2013-10-01 MED ORDER — DOXORUBICIN HCL CHEMO IV INJECTION 2 MG/ML
25.0000 mg/m2 | Freq: Once | INTRAVENOUS | Status: AC
  Administered 2013-10-01: 56 mg via INTRAVENOUS
  Filled 2013-10-01: qty 28

## 2013-10-01 MED ORDER — VINBLASTINE SULFATE CHEMO INJECTION 1 MG/ML
5.9000 mg/m2 | Freq: Once | INTRAVENOUS | Status: AC
  Administered 2013-10-01: 13 mg via INTRAVENOUS
  Filled 2013-10-01: qty 13

## 2013-10-01 MED ORDER — HEPARIN SOD (PORK) LOCK FLUSH 100 UNIT/ML IV SOLN
500.0000 [IU] | Freq: Once | INTRAVENOUS | Status: AC | PRN
  Administered 2013-10-01: 500 [IU]
  Filled 2013-10-01: qty 5

## 2013-10-01 MED ORDER — PALONOSETRON HCL INJECTION 0.25 MG/5ML
0.2500 mg | Freq: Once | INTRAVENOUS | Status: AC
  Administered 2013-10-01: 0.25 mg via INTRAVENOUS

## 2013-10-01 MED ORDER — DEXAMETHASONE SODIUM PHOSPHATE 20 MG/5ML IJ SOLN
INTRAMUSCULAR | Status: AC
  Filled 2013-10-01: qty 5

## 2013-10-01 MED ORDER — SODIUM CHLORIDE 0.9 % IV SOLN
375.0000 mg/m2 | Freq: Once | INTRAVENOUS | Status: AC
  Administered 2013-10-01: 820 mg via INTRAVENOUS
  Filled 2013-10-01: qty 41

## 2013-10-01 MED ORDER — DEXAMETHASONE SODIUM PHOSPHATE 20 MG/5ML IJ SOLN
20.0000 mg | Freq: Once | INTRAMUSCULAR | Status: AC
  Administered 2013-10-01: 20 mg via INTRAVENOUS

## 2013-10-04 ENCOUNTER — Encounter: Payer: Self-pay | Admitting: Oncology

## 2013-10-04 NOTE — Progress Notes (Signed)
Faxed clinical information to [employer] @ [phone] for patient's disability.  ° °  ° ° °

## 2013-10-08 ENCOUNTER — Other Ambulatory Visit: Payer: 59

## 2013-10-08 ENCOUNTER — Ambulatory Visit: Payer: 59 | Admitting: Oncology

## 2013-10-08 ENCOUNTER — Ambulatory Visit: Payer: 59

## 2013-10-11 ENCOUNTER — Telehealth: Payer: Self-pay | Admitting: *Deleted

## 2013-10-11 NOTE — Telephone Encounter (Signed)
Received message from On-Call RN that pt had a hard time with constipation over the weekend & was recommended to go to the ED but noticed that she did not go.  Tried to call pt & received vm & asked to call us with an update.  Pt's father called back  & left vm verifying  that pt did not go to the ED & had a small BM yest & a somewhat normal one today & has started pure apple juice & prune juice on a regular basis.  Discussed with Dr Beryle Beams & he suggested that pt start miralax twice daily & if bowels get too loose, decrease to daily.  Informed via vm that problem is probably due to the vinblastine in her chemo regimen & she should start the miralax now & cont after treatment.

## 2013-10-15 ENCOUNTER — Telehealth: Payer: Self-pay | Admitting: Oncology

## 2013-10-15 ENCOUNTER — Other Ambulatory Visit (HOSPITAL_BASED_OUTPATIENT_CLINIC_OR_DEPARTMENT_OTHER): Payer: 59

## 2013-10-15 ENCOUNTER — Ambulatory Visit (HOSPITAL_BASED_OUTPATIENT_CLINIC_OR_DEPARTMENT_OTHER): Payer: 59 | Admitting: Oncology

## 2013-10-15 ENCOUNTER — Ambulatory Visit: Payer: 59

## 2013-10-15 VITALS — BP 122/63 | HR 86 | Temp 97.2°F | Resp 18 | Ht 69.0 in | Wt 231.5 lb

## 2013-10-15 DIAGNOSIS — C8110 Nodular sclerosis classical Hodgkin lymphoma, unspecified site: Secondary | ICD-10-CM

## 2013-10-15 DIAGNOSIS — K5900 Constipation, unspecified: Secondary | ICD-10-CM

## 2013-10-15 DIAGNOSIS — C8119 Nodular sclerosis classical Hodgkin lymphoma, extranodal and solid organ sites: Secondary | ICD-10-CM

## 2013-10-15 LAB — CBC WITH DIFFERENTIAL/PLATELET
BASO%: 2 % (ref 0.0–2.0)
BASOS ABS: 0 10*3/uL (ref 0.0–0.1)
EOS%: 2.4 % (ref 0.0–7.0)
Eosinophils Absolute: 0 10*3/uL (ref 0.0–0.5)
HCT: 36 % (ref 34.8–46.6)
HGB: 11.7 g/dL (ref 11.6–15.9)
LYMPH#: 0.8 10*3/uL — AB (ref 0.9–3.3)
LYMPH%: 48.4 % (ref 14.0–49.7)
MCH: 25.2 pg (ref 25.1–34.0)
MCHC: 32.4 g/dL (ref 31.5–36.0)
MCV: 77.5 fL — ABNORMAL LOW (ref 79.5–101.0)
MONO#: 0.3 10*3/uL (ref 0.1–0.9)
MONO%: 16.2 % — AB (ref 0.0–14.0)
NEUT#: 0.5 10*3/uL — CL (ref 1.5–6.5)
NEUT%: 31 % — AB (ref 38.4–76.8)
Platelets: 324 10*3/uL (ref 145–400)
RBC: 4.64 10*6/uL (ref 3.70–5.45)
RDW: 21.6 % — AB (ref 11.2–14.5)
WBC: 1.7 10*3/uL — ABNORMAL LOW (ref 3.9–10.3)

## 2013-10-15 NOTE — Telephone Encounter (Signed)
gv and printed appt sched and avs forpt for Jan Feb and March...sed added tx.

## 2013-10-15 NOTE — Progress Notes (Signed)
Hematology and Oncology Follow Up Visit  Miranda Villegas [id number] 06-Dec-1990 23 y.o. 10/15/2013 3:10 PM   Principle Diagnosis: Encounter Diagnosis  Name Primary?  . 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 Additional left supraclavicular, right paratracheal, left axillary, lymph nodes on CT scan. We were not able to get a pretreatment PET scan secondary to hospital policy of not allowing inpatient studies. She was started on steroids to relieve respiratory distress and then received her first cycle of ABVD chemotherapy via a PICC catheter on August 12, 2013. She is continue treatments as an outpatient. She has had 2 full cycles so far most recent treatment 10/01/2013. She has had rapid regression of all areas of external adenopathy. We eventually did get a PET scan which had to be done at [facility] never sent to me. We had to call over there today to get it. Study was done on 08/26/2013. This was done just one day before day 15 cycle 2 chemotherapy and  I would not have expected to see much change. The mediastinal mass was measured as 12.6 x 5.8 slightly less prominent than on a pretreatment CT scan. Of interest, no significant PET uptake. The mediastinal mass had an overall SUV of 2 with a small central focus with an SUV of 4;  no other areas of abnormal metabolic activity in the abdomen and pelvis including the spleen. No hypermetabolic lymph nodes in the neck despite initial bulky cervical disease.  She has had a moderate degree of myelosuppression resulting in delay of one treatment and addition of Neulasta to her regimen.  She has had major problems  with constipation. We have advised her to use multiple different laxatives.  On New Year's Eve she developed flu symptoms. I started her on Tamiflu and a steroid Dosepak.  She is getting some intermittent mild paresthesias of her toes. No mouth sores. No dyspnea. Recent viral bronchitis has resolved. Symptoms lasted 2 weeks.  Medications: reviewed  Allergies:  Allergies  Allergen Reactions  . Adhesive [Tape]     Rash    Review of Systems: Hematology:  No bleeding or bruising ENT ROS: No sore throat Breast ROS:  Respiratory ROS: Recent viral bronchitis , no dyspnea Cardiovascular ROS:  No chest pain or palpitations Gastrointestinal ROS: No abdominal pain. Constipation from chemotherapy see above   Genito-Urinary ROS: No urinary tract symptoms Musculoskeletal ROS: No muscle or bone pain Neurological ROS: No headache or change in vision Dermatological ROS: No rash Remaining ROS negative:   Physical Exam: Blood pressure 122/63, pulse 86, temperature 97.2 F (36.2 C), temperature source Oral, resp. rate 18, height 5\' 9"  (1.753 m), weight 231 lb 8 oz (105.008 kg). Wt Readings from Last 3 Encounters:  10/15/13 231 lb 8 oz (105.008 kg)  09/10/13 223 lb 6.4 oz (101.334 kg)  09/09/13 218 lb (98.884 kg)     General appearance: Pleasant well-nourished Caucasian woman HENNT: Pharynx no erythema, exudate, mass, or ulcer. No thyromegaly or thyroid nodules Lymph nodes: Resolved cervical, supraclavicular, single today's 2 cm persistent left axillary node Breasts:  Lungs: Clear to auscultation, resonant to percussion throughout Heart: Regular rhythm, no murmur, no gallop, no rub, no click, no edema Abdomen: Soft, nontender,  normal bowel sounds, no mass, no organomegaly Extremities: No edema, no calf tenderness Musculoskeletal: no joint deformities GU:  Vascular: Carotid pulses 2+, no bruits,  Neurologic: Alert, oriented, PERRLA,  cranial nerves grossly normal, motor strength 5 over 5,  reflexes 1+ symmetric, upper body coordination normal, gait normal, Skin: No rash or ecchymosis  Lab Results: CBC W/Diff    Component Value Date/Time   WBC 1.7* 10/15/2013 0930   WBC 1.7* 09/09/2013 0940   RBC 4.64 10/15/2013 0930   RBC 4.65 09/09/2013 0940   RBC 4.13 08/12/2013 1255   HGB 11.7 10/15/2013 0930   HGB 10.8* 09/09/2013 0940   HCT 36.0 10/15/2013 0930   HCT 35.2* 09/09/2013 0940   PLT 324 10/15/2013 0930   PLT 289 09/09/2013 0940   MCV 77.5* 10/15/2013 0930   MCV 75.7* 09/09/2013 0940   MCH 25.2 10/15/2013 0930   MCH 23.2* 09/09/2013 0940   MCHC 32.4 10/15/2013 0930   MCHC 30.7 09/09/2013 0940   RDW 21.6* 10/15/2013 0930   RDW 18.2* 09/09/2013 0940   LYMPHSABS 0.8* 10/15/2013 0930   LYMPHSABS 0.6* 08/12/2013 1255   MONOABS 0.3 10/15/2013 0930   MONOABS 0.7 08/12/2013 1255   EOSABS 0.0 10/15/2013 0930   EOSABS 0.1 08/12/2013 1255   BASOSABS 0.0 10/15/2013 0930   BASOSABS 0.0 08/12/2013 1255     Chemistry      Component Value Date/Time   NA 139 09/10/2013 1016   NA 138 09/09/2013 0940   K 4.0 09/10/2013 1016   K 4.1 09/09/2013 0940   CL 105 09/09/2013 0940   CO2 24 09/10/2013 1016   CO2 24 09/09/2013 0940   BUN 12.2 09/10/2013 1016   BUN 15 09/09/2013 0940   CREATININE 0.8 09/10/2013 1016   CREATININE 0.71 09/09/2013 0940      Component Value Date/Time   CALCIUM 9.5 09/10/2013 1016   CALCIUM 9.0 09/09/2013 0940   ALKPHOS 86 09/10/2013 1016   ALKPHOS 85 08/15/2013 0515   AST 12 09/10/2013 1016   AST 8 08/15/2013 0515   ALT 13 09/10/2013 1016   ALT 16 08/15/2013 0515   BILITOT 0.24 09/10/2013 1016   BILITOT 0.1* 08/15/2013 0515       Radiological Studies: See discussion above   Impression:  #1. Stage IIB Hodgkin's lymphoma She is having a clinical response to treatment. Complete resolution of bulky cervical adenopathy. Single 2-3 cm left axillary node on exam today. Decrease from prior exam. She was due for treatment today but Absolute neutrophils  only 500 today but monocytes are recovering and I anticipate by Monday we will be able to go ahead and give treatment and not to lay for an entire week. She will come back again Monday for a repeat CBC and the neutrophils 800 or above then proceed with cycle 3 of treatment. Neulasta day 2. ABVD day 15. CT scan following third cycle. PET scan after completion of all treatment.  #2. Severe constipation. Likely related to the vinblastine component of her chemotherapy. She is advised to stay on laxatives on a daily basis.   CC: Patient Care Team: Berkley Harvey as PCP - General (Nurse Practitioner) Izora Gala, MD as Consulting Physician (Otolaryngology) Melrose Nakayama, MD as Consulting Physician (Cardiothoracic Surgery)   Annia Belt, MD 1/23/20153:10 PM

## 2013-10-15 NOTE — Telephone Encounter (Signed)
s.w. pt and advised that time on 2.23.15 appt has changed to 9am...pt ok adn aware

## 2013-10-16 ENCOUNTER — Ambulatory Visit: Payer: 59

## 2013-10-18 ENCOUNTER — Other Ambulatory Visit (HOSPITAL_BASED_OUTPATIENT_CLINIC_OR_DEPARTMENT_OTHER): Payer: 59

## 2013-10-18 ENCOUNTER — Other Ambulatory Visit: Payer: Self-pay | Admitting: Oncology

## 2013-10-18 ENCOUNTER — Ambulatory Visit: Payer: 59 | Admitting: Hematology and Oncology

## 2013-10-18 ENCOUNTER — Ambulatory Visit (HOSPITAL_BASED_OUTPATIENT_CLINIC_OR_DEPARTMENT_OTHER): Payer: 59

## 2013-10-18 DIAGNOSIS — C8119 Nodular sclerosis classical Hodgkin lymphoma, extranodal and solid organ sites: Secondary | ICD-10-CM

## 2013-10-18 DIAGNOSIS — C8110 Nodular sclerosis classical Hodgkin lymphoma, unspecified site: Secondary | ICD-10-CM

## 2013-10-18 DIAGNOSIS — C8190 Hodgkin lymphoma, unspecified, unspecified site: Secondary | ICD-10-CM

## 2013-10-18 DIAGNOSIS — R590 Localized enlarged lymph nodes: Secondary | ICD-10-CM

## 2013-10-18 DIAGNOSIS — Z5111 Encounter for antineoplastic chemotherapy: Secondary | ICD-10-CM

## 2013-10-18 LAB — CBC WITH DIFFERENTIAL/PLATELET
BASO%: 1.2 % (ref 0.0–2.0)
Basophils Absolute: 0 10*3/uL (ref 0.0–0.1)
EOS%: 1.9 % (ref 0.0–7.0)
Eosinophils Absolute: 0.1 10*3/uL (ref 0.0–0.5)
HCT: 38.9 % (ref 34.8–46.6)
HGB: 12.2 g/dL (ref 11.6–15.9)
LYMPH#: 1.2 10*3/uL (ref 0.9–3.3)
LYMPH%: 46.3 % (ref 14.0–49.7)
MCH: 24.8 pg — AB (ref 25.1–34.0)
MCHC: 31.4 g/dL — AB (ref 31.5–36.0)
MCV: 79.1 fL — ABNORMAL LOW (ref 79.5–101.0)
MONO#: 0.4 10*3/uL (ref 0.1–0.9)
MONO%: 16.7 % — ABNORMAL HIGH (ref 0.0–14.0)
NEUT#: 0.9 10*3/uL — ABNORMAL LOW (ref 1.5–6.5)
NEUT%: 33.9 % — ABNORMAL LOW (ref 38.4–76.8)
Platelets: 210 10*3/uL (ref 145–400)
RBC: 4.92 10*6/uL (ref 3.70–5.45)
RDW: 18.8 % — AB (ref 11.2–14.5)
WBC: 2.6 10*3/uL — AB (ref 3.9–10.3)
nRBC: 0 % (ref 0–0)

## 2013-10-18 LAB — COMPREHENSIVE METABOLIC PANEL (CC13)
ALBUMIN: 3.6 g/dL (ref 3.5–5.0)
ALK PHOS: 74 U/L (ref 40–150)
ALT: 25 U/L (ref 0–55)
AST: 15 U/L (ref 5–34)
Anion Gap: 9 mEq/L (ref 3–11)
BUN: 10.7 mg/dL (ref 7.0–26.0)
CO2: 26 mEq/L (ref 22–29)
Calcium: 9 mg/dL (ref 8.4–10.4)
Chloride: 106 mEq/L (ref 98–109)
Creatinine: 0.8 mg/dL (ref 0.6–1.1)
Glucose: 108 mg/dl (ref 70–140)
Potassium: 3.8 mEq/L (ref 3.5–5.1)
SODIUM: 140 meq/L (ref 136–145)
Total Bilirubin: 0.21 mg/dL (ref 0.20–1.20)
Total Protein: 6.5 g/dL (ref 6.4–8.3)

## 2013-10-18 LAB — LACTATE DEHYDROGENASE (CC13): LDH: 171 U/L (ref 125–245)

## 2013-10-18 MED ORDER — SODIUM CHLORIDE 0.9 % IV SOLN
Freq: Once | INTRAVENOUS | Status: AC
  Administered 2013-10-18: 14:00:00 via INTRAVENOUS

## 2013-10-18 MED ORDER — VINBLASTINE SULFATE CHEMO INJECTION 1 MG/ML
5.9000 mg/m2 | Freq: Once | INTRAVENOUS | Status: AC
  Administered 2013-10-18: 13 mg via INTRAVENOUS
  Filled 2013-10-18: qty 13

## 2013-10-18 MED ORDER — PALONOSETRON HCL INJECTION 0.25 MG/5ML
0.2500 mg | Freq: Once | INTRAVENOUS | Status: AC
  Administered 2013-10-18: 0.25 mg via INTRAVENOUS

## 2013-10-18 MED ORDER — DEXAMETHASONE SODIUM PHOSPHATE 20 MG/5ML IJ SOLN
20.0000 mg | Freq: Once | INTRAMUSCULAR | Status: AC
  Administered 2013-10-18: 20 mg via INTRAVENOUS

## 2013-10-18 MED ORDER — SODIUM CHLORIDE 0.9 % IV SOLN
10.0000 [IU]/m2 | Freq: Once | INTRAVENOUS | Status: AC
  Administered 2013-10-18: 22 [IU] via INTRAVENOUS
  Filled 2013-10-18: qty 7.33

## 2013-10-18 MED ORDER — DOXORUBICIN HCL CHEMO IV INJECTION 2 MG/ML
25.0000 mg/m2 | Freq: Once | INTRAVENOUS | Status: AC
  Administered 2013-10-18: 56 mg via INTRAVENOUS
  Filled 2013-10-18: qty 28

## 2013-10-18 MED ORDER — SODIUM CHLORIDE 0.9 % IV SOLN
375.0000 mg/m2 | Freq: Once | INTRAVENOUS | Status: AC
  Administered 2013-10-18: 820 mg via INTRAVENOUS
  Filled 2013-10-18: qty 41

## 2013-10-18 MED ORDER — DEXAMETHASONE SODIUM PHOSPHATE 20 MG/5ML IJ SOLN
INTRAMUSCULAR | Status: AC
  Filled 2013-10-18: qty 5

## 2013-10-18 MED ORDER — SODIUM CHLORIDE 0.9 % IJ SOLN
10.0000 mL | INTRAMUSCULAR | Status: DC | PRN
  Administered 2013-10-18: 10 mL
  Filled 2013-10-18: qty 10

## 2013-10-18 MED ORDER — HEPARIN SOD (PORK) LOCK FLUSH 100 UNIT/ML IV SOLN
500.0000 [IU] | Freq: Once | INTRAVENOUS | Status: AC | PRN
  Administered 2013-10-18: 500 [IU]
  Filled 2013-10-18: qty 5

## 2013-10-18 MED ORDER — PALONOSETRON HCL INJECTION 0.25 MG/5ML
INTRAVENOUS | Status: AC
  Filled 2013-10-18: qty 5

## 2013-10-18 NOTE — Progress Notes (Signed)
OK to treat despite labs per Dr. Beryle Beams.

## 2013-10-19 ENCOUNTER — Ambulatory Visit (HOSPITAL_BASED_OUTPATIENT_CLINIC_OR_DEPARTMENT_OTHER): Payer: 59

## 2013-10-19 ENCOUNTER — Telehealth: Payer: Self-pay | Admitting: *Deleted

## 2013-10-19 VITALS — BP 109/84 | HR 70 | Temp 98.1°F

## 2013-10-19 DIAGNOSIS — C8110 Nodular sclerosis classical Hodgkin lymphoma, unspecified site: Secondary | ICD-10-CM

## 2013-10-19 DIAGNOSIS — Z5189 Encounter for other specified aftercare: Secondary | ICD-10-CM

## 2013-10-19 DIAGNOSIS — C8119 Nodular sclerosis classical Hodgkin lymphoma, extranodal and solid organ sites: Secondary | ICD-10-CM

## 2013-10-19 DIAGNOSIS — R590 Localized enlarged lymph nodes: Secondary | ICD-10-CM

## 2013-10-19 DIAGNOSIS — C8190 Hodgkin lymphoma, unspecified, unspecified site: Secondary | ICD-10-CM

## 2013-10-19 MED ORDER — PEGFILGRASTIM INJECTION 6 MG/0.6ML
6.0000 mg | Freq: Once | SUBCUTANEOUS | Status: AC
  Administered 2013-10-19: 6 mg via SUBCUTANEOUS
  Filled 2013-10-19: qty 0.6

## 2013-10-19 NOTE — Telephone Encounter (Signed)
Late Entry:  Pt in injection room today @ 9:45 c/o of numbness in forearms.  Pt reports "its a numb/tingling feeling just in my forearms; not in my hands or upper arms."  Pt denies any other complaints; vital signs stable.  Instructed pt that MD will be made aware of numb/tingling in forearms and if further instructions--will call pt; instructed pt to call office if any further problems.  Pt and pt's dad verbalized understanding.  Dr. Beryle Beams made aware and no new orders received.

## 2013-10-22 ENCOUNTER — Other Ambulatory Visit: Payer: 59

## 2013-10-22 ENCOUNTER — Ambulatory Visit: Payer: 59

## 2013-10-26 ENCOUNTER — Encounter: Payer: Self-pay | Admitting: Oncology

## 2013-10-26 NOTE — Progress Notes (Signed)
Faxed clinical information to [facility] @ [phone] for patient's disability.

## 2013-10-29 ENCOUNTER — Other Ambulatory Visit: Payer: 59

## 2013-10-29 ENCOUNTER — Ambulatory Visit: Payer: 59

## 2013-11-01 ENCOUNTER — Encounter: Payer: Self-pay | Admitting: Oncology

## 2013-11-01 ENCOUNTER — Ambulatory Visit (HOSPITAL_BASED_OUTPATIENT_CLINIC_OR_DEPARTMENT_OTHER): Payer: 59

## 2013-11-01 ENCOUNTER — Other Ambulatory Visit: Payer: Self-pay | Admitting: *Deleted

## 2013-11-01 ENCOUNTER — Other Ambulatory Visit (HOSPITAL_BASED_OUTPATIENT_CLINIC_OR_DEPARTMENT_OTHER): Payer: 59

## 2013-11-01 VITALS — BP 121/67 | HR 89 | Temp 97.8°F

## 2013-11-01 DIAGNOSIS — C8110 Nodular sclerosis classical Hodgkin lymphoma, unspecified site: Secondary | ICD-10-CM

## 2013-11-01 DIAGNOSIS — C8119 Nodular sclerosis classical Hodgkin lymphoma, extranodal and solid organ sites: Secondary | ICD-10-CM

## 2013-11-01 DIAGNOSIS — R590 Localized enlarged lymph nodes: Secondary | ICD-10-CM

## 2013-11-01 DIAGNOSIS — Z5111 Encounter for antineoplastic chemotherapy: Secondary | ICD-10-CM

## 2013-11-01 DIAGNOSIS — C8190 Hodgkin lymphoma, unspecified, unspecified site: Secondary | ICD-10-CM

## 2013-11-01 LAB — CBC WITH DIFFERENTIAL/PLATELET
BASO%: 0.5 % (ref 0.0–2.0)
BASOS ABS: 0 10*3/uL (ref 0.0–0.1)
EOS%: 0.8 % (ref 0.0–7.0)
Eosinophils Absolute: 0.1 10*3/uL (ref 0.0–0.5)
HEMATOCRIT: 38.3 % (ref 34.8–46.6)
HGB: 12.2 g/dL (ref 11.6–15.9)
LYMPH#: 1.3 10*3/uL (ref 0.9–3.3)
LYMPH%: 14.8 % (ref 14.0–49.7)
MCH: 25.3 pg (ref 25.1–34.0)
MCHC: 31.9 g/dL (ref 31.5–36.0)
MCV: 79.5 fL (ref 79.5–101.0)
MONO#: 0.4 10*3/uL (ref 0.1–0.9)
MONO%: 4.5 % (ref 0.0–14.0)
NEUT%: 79.4 % — AB (ref 38.4–76.8)
NEUTROS ABS: 7 10*3/uL — AB (ref 1.5–6.5)
Platelets: 253 10*3/uL (ref 145–400)
RBC: 4.82 10*6/uL (ref 3.70–5.45)
RDW: 19.2 % — ABNORMAL HIGH (ref 11.2–14.5)
WBC: 8.7 10*3/uL (ref 3.9–10.3)
nRBC: 0 % (ref 0–0)

## 2013-11-01 MED ORDER — SODIUM CHLORIDE 0.9 % IV SOLN
Freq: Once | INTRAVENOUS | Status: AC
  Administered 2013-11-01: 11:00:00 via INTRAVENOUS

## 2013-11-01 MED ORDER — SODIUM CHLORIDE 0.9 % IJ SOLN
10.0000 mL | INTRAMUSCULAR | Status: DC | PRN
  Administered 2013-11-01: 10 mL
  Filled 2013-11-01: qty 10

## 2013-11-01 MED ORDER — HEPARIN SOD (PORK) LOCK FLUSH 100 UNIT/ML IV SOLN
500.0000 [IU] | Freq: Once | INTRAVENOUS | Status: AC | PRN
  Administered 2013-11-01: 500 [IU]
  Filled 2013-11-01: qty 5

## 2013-11-01 MED ORDER — PALONOSETRON HCL INJECTION 0.25 MG/5ML
0.2500 mg | Freq: Once | INTRAVENOUS | Status: AC
  Administered 2013-11-01: 0.25 mg via INTRAVENOUS

## 2013-11-01 MED ORDER — SODIUM CHLORIDE 0.9 % IV SOLN
13.0000 mg | Freq: Once | INTRAVENOUS | Status: AC
  Administered 2013-11-01: 13 mg via INTRAVENOUS
  Filled 2013-11-01: qty 13

## 2013-11-01 MED ORDER — SODIUM CHLORIDE 0.9 % IV SOLN
375.0000 mg/m2 | Freq: Once | INTRAVENOUS | Status: AC
  Administered 2013-11-01: 820 mg via INTRAVENOUS
  Filled 2013-11-01: qty 41

## 2013-11-01 MED ORDER — DEXAMETHASONE SODIUM PHOSPHATE 20 MG/5ML IJ SOLN
20.0000 mg | Freq: Once | INTRAMUSCULAR | Status: AC
  Administered 2013-11-01: 20 mg via INTRAVENOUS

## 2013-11-01 MED ORDER — DOXORUBICIN HCL CHEMO IV INJECTION 2 MG/ML
25.0000 mg/m2 | Freq: Once | INTRAVENOUS | Status: AC
  Administered 2013-11-01: 56 mg via INTRAVENOUS
  Filled 2013-11-01: qty 28

## 2013-11-01 MED ORDER — PALONOSETRON HCL INJECTION 0.25 MG/5ML
INTRAVENOUS | Status: AC
  Filled 2013-11-01: qty 5

## 2013-11-01 MED ORDER — SODIUM CHLORIDE 0.9 % IV SOLN
10.0000 [IU]/m2 | Freq: Once | INTRAVENOUS | Status: AC
  Administered 2013-11-01: 22 [IU] via INTRAVENOUS
  Filled 2013-11-01: qty 7.33

## 2013-11-01 MED ORDER — DEXAMETHASONE SODIUM PHOSPHATE 20 MG/5ML IJ SOLN
INTRAMUSCULAR | Status: AC
  Filled 2013-11-01: qty 5

## 2013-11-01 NOTE — Progress Notes (Signed)
Faxed fmla form to [facility]: Thad Ranger, @ [phone].

## 2013-11-01 NOTE — Progress Notes (Signed)
Brisk blood return noted before, during and at end of Adriamycin administration. 

## 2013-11-01 NOTE — Patient Instructions (Signed)
[facility] Discharge Instructions for Patients Receiving Chemotherapy  Today you received the following chemotherapy agents Adriamycin/Velban/Bleomycin/Dacarbazine.  To help prevent nausea and vomiting after your treatment, we encourage you to take your nausea medication as prescribed.   If you develop nausea and vomiting that is not controlled by your nausea medication, call the clinic.   BELOW ARE SYMPTOMS THAT SHOULD BE REPORTED IMMEDIATELY:  *FEVER GREATER THAN 100.5 F  *CHILLS WITH OR WITHOUT FEVER  NAUSEA AND VOMITING THAT IS NOT CONTROLLED WITH YOUR NAUSEA MEDICATION  *UNUSUAL SHORTNESS OF BREATH  *UNUSUAL BRUISING OR BLEEDING  TENDERNESS IN MOUTH AND THROAT WITH OR WITHOUT PRESENCE OF ULCERS  *URINARY PROBLEMS  *BOWEL PROBLEMS  UNUSUAL RASH Items with * indicate a potential emergency and should be followed up as soon as possible.  Feel free to call the clinic you have any questions or concerns. The clinic phone number is [phone].

## 2013-11-10 ENCOUNTER — Other Ambulatory Visit: Payer: Self-pay | Admitting: Oncology

## 2013-11-15 ENCOUNTER — Ambulatory Visit (HOSPITAL_BASED_OUTPATIENT_CLINIC_OR_DEPARTMENT_OTHER): Payer: 59

## 2013-11-15 ENCOUNTER — Ambulatory Visit: Payer: 59 | Admitting: Oncology

## 2013-11-15 ENCOUNTER — Telehealth: Payer: Self-pay | Admitting: Oncology

## 2013-11-15 ENCOUNTER — Ambulatory Visit: Payer: 59

## 2013-11-15 ENCOUNTER — Other Ambulatory Visit: Payer: 59

## 2013-11-15 ENCOUNTER — Ambulatory Visit: Payer: 59 | Admitting: Nurse Practitioner

## 2013-11-15 ENCOUNTER — Other Ambulatory Visit (HOSPITAL_BASED_OUTPATIENT_CLINIC_OR_DEPARTMENT_OTHER): Payer: 59

## 2013-11-15 ENCOUNTER — Ambulatory Visit (HOSPITAL_BASED_OUTPATIENT_CLINIC_OR_DEPARTMENT_OTHER): Payer: 59 | Admitting: Oncology

## 2013-11-15 VITALS — BP 141/67 | HR 90 | Temp 98.1°F | Resp 20 | Ht 69.0 in | Wt 238.8 lb

## 2013-11-15 DIAGNOSIS — C8110 Nodular sclerosis classical Hodgkin lymphoma, unspecified site: Secondary | ICD-10-CM

## 2013-11-15 DIAGNOSIS — C8119 Nodular sclerosis classical Hodgkin lymphoma, extranodal and solid organ sites: Secondary | ICD-10-CM

## 2013-11-15 DIAGNOSIS — R590 Localized enlarged lymph nodes: Secondary | ICD-10-CM

## 2013-11-15 DIAGNOSIS — C8190 Hodgkin lymphoma, unspecified, unspecified site: Secondary | ICD-10-CM

## 2013-11-15 DIAGNOSIS — R110 Nausea: Secondary | ICD-10-CM

## 2013-11-15 DIAGNOSIS — Z5111 Encounter for antineoplastic chemotherapy: Secondary | ICD-10-CM

## 2013-11-15 LAB — COMPREHENSIVE METABOLIC PANEL (CC13)
ALBUMIN: 3.8 g/dL (ref 3.5–5.0)
ALK PHOS: 75 U/L (ref 40–150)
ALT: 19 U/L (ref 0–55)
AST: 16 U/L (ref 5–34)
Anion Gap: 8 mEq/L (ref 3–11)
BUN: 10.6 mg/dL (ref 7.0–26.0)
CALCIUM: 9.2 mg/dL (ref 8.4–10.4)
CO2: 23 mEq/L (ref 22–29)
Chloride: 108 mEq/L (ref 98–109)
Creatinine: 0.7 mg/dL (ref 0.6–1.1)
GLUCOSE: 100 mg/dL (ref 70–140)
POTASSIUM: 4.2 meq/L (ref 3.5–5.1)
Sodium: 139 mEq/L (ref 136–145)
Total Bilirubin: 0.25 mg/dL (ref 0.20–1.20)
Total Protein: 6.7 g/dL (ref 6.4–8.3)

## 2013-11-15 LAB — CBC WITH DIFFERENTIAL/PLATELET
BASO%: 3.3 % — ABNORMAL HIGH (ref 0.0–2.0)
BASOS ABS: 0.1 10*3/uL (ref 0.0–0.1)
EOS%: 2.2 % (ref 0.0–7.0)
Eosinophils Absolute: 0 10*3/uL (ref 0.0–0.5)
HEMATOCRIT: 35.6 % (ref 34.8–46.6)
HGB: 11.3 g/dL — ABNORMAL LOW (ref 11.6–15.9)
LYMPH#: 0.7 10*3/uL — AB (ref 0.9–3.3)
LYMPH%: 39.8 % (ref 14.0–49.7)
MCH: 25.3 pg (ref 25.1–34.0)
MCHC: 31.7 g/dL (ref 31.5–36.0)
MCV: 79.8 fL (ref 79.5–101.0)
MONO#: 0.3 10*3/uL (ref 0.1–0.9)
MONO%: 14.4 % — ABNORMAL HIGH (ref 0.0–14.0)
NEUT#: 0.7 10*3/uL — ABNORMAL LOW (ref 1.5–6.5)
NEUT%: 40.3 % (ref 38.4–76.8)
PLATELETS: 319 10*3/uL (ref 145–400)
RBC: 4.46 10*6/uL (ref 3.70–5.45)
RDW: 16.9 % — ABNORMAL HIGH (ref 11.2–14.5)
WBC: 1.8 10*3/uL — ABNORMAL LOW (ref 3.9–10.3)
nRBC: 0 % (ref 0–0)

## 2013-11-15 MED ORDER — HEPARIN SOD (PORK) LOCK FLUSH 100 UNIT/ML IV SOLN
500.0000 [IU] | Freq: Once | INTRAVENOUS | Status: AC | PRN
  Administered 2013-11-15: 500 [IU]
  Filled 2013-11-15: qty 5

## 2013-11-15 MED ORDER — LORAZEPAM 2 MG/ML IJ SOLN
INTRAMUSCULAR | Status: AC
  Filled 2013-11-15: qty 1

## 2013-11-15 MED ORDER — DEXAMETHASONE SODIUM PHOSPHATE 20 MG/5ML IJ SOLN
12.0000 mg | Freq: Once | INTRAMUSCULAR | Status: AC
  Administered 2013-11-15: 12 mg via INTRAVENOUS

## 2013-11-15 MED ORDER — SODIUM CHLORIDE 0.9 % IV SOLN
Freq: Once | INTRAVENOUS | Status: AC
  Administered 2013-11-15: 11:00:00 via INTRAVENOUS

## 2013-11-15 MED ORDER — DOXORUBICIN HCL CHEMO IV INJECTION 2 MG/ML
25.0000 mg/m2 | Freq: Once | INTRAVENOUS | Status: AC
  Administered 2013-11-15: 56 mg via INTRAVENOUS
  Filled 2013-11-15: qty 28

## 2013-11-15 MED ORDER — LORAZEPAM 2 MG/ML IJ SOLN
1.0000 mg | Freq: Once | INTRAMUSCULAR | Status: AC
  Administered 2013-11-15: 1 mg via INTRAVENOUS

## 2013-11-15 MED ORDER — SODIUM CHLORIDE 0.9 % IV SOLN
10.0000 [IU]/m2 | Freq: Once | INTRAVENOUS | Status: AC
  Administered 2013-11-15: 22 [IU] via INTRAVENOUS
  Filled 2013-11-15: qty 7.33

## 2013-11-15 MED ORDER — VINBLASTINE SULFATE CHEMO INJECTION 1 MG/ML
5.9000 mg/m2 | Freq: Once | INTRAVENOUS | Status: AC
  Administered 2013-11-15: 13 mg via INTRAVENOUS
  Filled 2013-11-15: qty 13

## 2013-11-15 MED ORDER — SODIUM CHLORIDE 0.9 % IV SOLN
375.0000 mg/m2 | Freq: Once | INTRAVENOUS | Status: AC
  Administered 2013-11-15: 820 mg via INTRAVENOUS
  Filled 2013-11-15: qty 41

## 2013-11-15 MED ORDER — PALONOSETRON HCL INJECTION 0.25 MG/5ML
0.2500 mg | Freq: Once | INTRAVENOUS | Status: AC
  Administered 2013-11-15: 0.25 mg via INTRAVENOUS

## 2013-11-15 MED ORDER — DEXAMETHASONE SODIUM PHOSPHATE 20 MG/5ML IJ SOLN
INTRAMUSCULAR | Status: AC
  Filled 2013-11-15: qty 5

## 2013-11-15 MED ORDER — PALONOSETRON HCL INJECTION 0.25 MG/5ML
INTRAVENOUS | Status: AC
  Filled 2013-11-15: qty 5

## 2013-11-15 MED ORDER — FOSAPREPITANT DIMEGLUMINE INJECTION 150 MG
150.0000 mg | Freq: Once | INTRAVENOUS | Status: AC
  Administered 2013-11-15: 150 mg via INTRAVENOUS
  Filled 2013-11-15: qty 5

## 2013-11-15 MED ORDER — SODIUM CHLORIDE 0.9 % IJ SOLN
10.0000 mL | INTRAMUSCULAR | Status: DC | PRN
  Administered 2013-11-15: 10 mL
  Filled 2013-11-15: qty 10

## 2013-11-15 NOTE — Telephone Encounter (Signed)
gv and printed appt sched and avs for pt for Feb thru April....sed added tx. °

## 2013-11-15 NOTE — Patient Instructions (Signed)
[facility] Discharge Instructions for Patients Receiving Chemotherapy  Today you received the following chemotherapy agents Adriamycin, Bleomycin, Vinblastine and DTIC.  To help prevent nausea and vomiting after your treatment, we encourage you to take your nausea medication.   If you develop nausea and vomiting that is not controlled by your nausea medication, call the clinic.   BELOW ARE SYMPTOMS THAT SHOULD BE REPORTED IMMEDIATELY:  *FEVER GREATER THAN 100.5 F  *CHILLS WITH OR WITHOUT FEVER  NAUSEA AND VOMITING THAT IS NOT CONTROLLED WITH YOUR NAUSEA MEDICATION  *UNUSUAL SHORTNESS OF BREATH  *UNUSUAL BRUISING OR BLEEDING  TENDERNESS IN MOUTH AND THROAT WITH OR WITHOUT PRESENCE OF ULCERS  *URINARY PROBLEMS  *BOWEL PROBLEMS  UNUSUAL RASH Items with * indicate a potential emergency and should be followed up as soon as possible.  Feel free to call the clinic you have any questions or concerns. The clinic phone number is [phone].    

## 2013-11-15 NOTE — Progress Notes (Signed)
Hematology and Oncology Follow Up Visit  Miranda Villegas [id number] Jan 18, 1991 23 y.o. 11/15/2013 7:34 PM   Principle Diagnosis: Encounter Diagnosis  Name Primary?  . Hodgkin's lymphoma, nodular sclerosis Yes     Interim History:  Is this 23 year old woman active treatment for stage IIIB Hodgkin's lymphoma. She presented in November 2014 with a 5 month history of progressive axillary and cervical lymphadenopathy and then rapidly progressive respiratory distress secondary to a large 5.5 x 13.7 cm anterior mediastinal mass and bulky bilateral right greater than left cervical lymphadenopathy with edema and narrowing of the vocal cord. There was no abdominal or pelvic adenopathy but there were 2 lesions in the spleen, largest 2.1 cm suspicious for involvement. We were not able to obtain authorization for a inpatient PET scan and the patient needed emergent treatment. She received a first cycle of ABVD in the hospital on 08/14/2013. PET scan done subsequently as an outpatient was already showing a response with minimal hypermetabolic activity in the areas of known nodal disease on the CT scans and no abnormal activity over the spleen. She had B. symptoms at presentation with night sweats and approximate 15 pound weight loss. Bone marrow biopsy not done. She has now had 3 full cycles of treatment. One treatment was delayed one week. Neulasta added to her regimen. She has subsequently been treated on time although I am treating  At low ANCs.  Complications of treatment so far include initial severe constipation. She is currently on a aggressive laxative regimen which is helping. She's had significant delayed nausea despite use of multiple antiemetics. She's had some reflux symptoms which responds to PPIs and antacids. She developed a mild influenza syndrome treated with Tamiflu and steroids and did not require any dose delays or reductions.  She has not had any respiratory symptoms on the bleomycin  component of her chemotherapy program.  Medications: reviewed  Allergies:  Allergies  Allergen Reactions  . Adhesive [Tape]     Rash    Review of Systems: Hematology: No bleeding or bruising  ENT ROS: No sore throat  Breast ROS:  Respiratory ROS: No cough or dyspnea Cardiovascular ROS:  No chest pain or palpitations Gastrointestinal ROS:   No abdominal pain. Resolved constipation. Genito-Urinary ROS: Not questioned Musculoskeletal ROS: No muscle or bone pain Neurological ROS: No significant headache or change in vision. No paresthesias Dermatological ROS: No rash or ecchymosis Remaining ROS negative:   Physical Exam: Blood pressure 141/67, pulse 90, temperature 98.1 F (36.7 C), temperature source Oral, resp. rate 20, height 5' 9"  (1.753 m), weight 238 lb 12.8 oz (108.319 kg). Wt Readings from Last 3 Encounters:  11/15/13 238 lb 12.8 oz (108.319 kg)  10/15/13 231 lb 8 oz (105.008 kg)  09/10/13 223 lb 6.4 oz (101.334 kg)     General appearance: Well-nourished Caucasian woman HENNT: Pharynx no erythema, exudate, mass, or ulcer. No thyromegaly or thyroid nodules Lymph nodes: No cervical, supraclavicular, or  right axillary lymphadenopathy. Persistent 2-3 cm left axillary node palpable. Breasts:  Lungs: Clear to auscultation, resonant to percussion throughout Heart: Regular rhythm, no murmur, no gallop, no rub, no click, no edema Abdomen: Soft, nontender, normal bowel sounds, no mass, no organomegaly Extremities: No edema, no calf tenderness Musculoskeletal: no joint deformities GU:  Vascular: Carotid pulses 2+, no bruits, Port-A-Cath infusion device. Neurologic: Alert, oriented, PERRLA,  , cranial nerves grossly normal, motor strength 5 over 5, reflexes 1+ symmetric, upper body coordination normal, gait normal, sensation intact to vibration by tuning fork exam  over the fingertips Skin: No rash or ecchymosis  Lab Results: CBC W/Diff    Component Value Date/Time   WBC  1.8* 11/15/2013 0900   WBC 1.7* 09/09/2013 0940   RBC 4.46 11/15/2013 0900   RBC 4.65 09/09/2013 0940   RBC 4.13 08/12/2013 1255   HGB 11.3* 11/15/2013 0900   HGB 10.8* 09/09/2013 0940   HCT 35.6 11/15/2013 0900   HCT 35.2* 09/09/2013 0940   PLT 319 11/15/2013 0900   PLT 289 09/09/2013 0940   MCV 79.8 11/15/2013 0900   MCV 75.7* 09/09/2013 0940   MCH 25.3 11/15/2013 0900   MCH 23.2* 09/09/2013 0940   MCHC 31.7 11/15/2013 0900   MCHC 30.7 09/09/2013 0940   RDW 16.9* 11/15/2013 0900   RDW 18.2* 09/09/2013 0940   LYMPHSABS 0.7* 11/15/2013 0900   LYMPHSABS 0.6* 08/12/2013 1255   MONOABS 0.3 11/15/2013 0900   MONOABS 0.7 08/12/2013 1255   EOSABS 0.0 11/15/2013 0900   EOSABS 0.1 08/12/2013 1255   BASOSABS 0.1 11/15/2013 0900   BASOSABS 0.0 08/12/2013 1255     Chemistry      Component Value Date/Time   NA 139 11/15/2013 0901   NA 138 09/09/2013 0940   K 4.2 11/15/2013 0901   K 4.1 09/09/2013 0940   CL 105 09/09/2013 0940   CO2 23 11/15/2013 0901   CO2 24 09/09/2013 0940   BUN 10.6 11/15/2013 0901   BUN 15 09/09/2013 0940   CREATININE 0.7 11/15/2013 0901   CREATININE 0.71 09/09/2013 0940      Component Value Date/Time   CALCIUM 9.2 11/15/2013 0901   CALCIUM 9.0 09/09/2013 0940   ALKPHOS 75 11/15/2013 0901   ALKPHOS 85 08/15/2013 0515   AST 16 11/15/2013 0901   AST 8 08/15/2013 0515   ALT 19 11/15/2013 0901   ALT 16 08/15/2013 0515   BILITOT 0.25 11/15/2013 0901   BILITOT 0.1* 08/15/2013 0515       Impression:  #1. Stage IIIB nodular sclerosing Hodgkin's lymphoma Overall she has had a nice response to treatment with resolution of bulky adenopathy and respiratory distress related to a large mediastinal mass. There is persistent adenopathy in the left axilla. She's now had 3 cycles of treatment. I'm going to go ahead and get interim CT scans at this time. I will defer a PET scan until completion of 6 cycles of treatment.  #2. Persistent delayed nausea. I have already added Aloxi to her  regimen. I will go ahead and add Emend today.  Both parents present today. Status and plan reviewed. She turns 23 on April 7 and was due for a treatment on April 6. Going to move her treatment to April 8. I'm going to treat today despite ANC 0.7. Return tomorrow for Neulasta. I will see her one more time and then transition her care to Dr. Alvy Bimler.   CC: Patient Care Team: Berkley Harvey as PCP - General (Nurse Practitioner) Izora Gala, MD as Consulting Physician (Otolaryngology) Melrose Nakayama, MD as Consulting Physician (Cardiothoracic Surgery)   Annia Belt, MD 2/23/20157:34 PM

## 2013-11-15 NOTE — Progress Notes (Signed)
Per Dr Beryle Beams, ok to treat today with ANC of 0.7.

## 2013-11-16 ENCOUNTER — Ambulatory Visit (HOSPITAL_BASED_OUTPATIENT_CLINIC_OR_DEPARTMENT_OTHER): Payer: 59

## 2013-11-16 VITALS — BP 132/73 | HR 97 | Temp 97.7°F

## 2013-11-16 DIAGNOSIS — C8119 Nodular sclerosis classical Hodgkin lymphoma, extranodal and solid organ sites: Secondary | ICD-10-CM

## 2013-11-16 DIAGNOSIS — C8190 Hodgkin lymphoma, unspecified, unspecified site: Secondary | ICD-10-CM

## 2013-11-16 DIAGNOSIS — Z5189 Encounter for other specified aftercare: Secondary | ICD-10-CM

## 2013-11-16 DIAGNOSIS — C8110 Nodular sclerosis classical Hodgkin lymphoma, unspecified site: Secondary | ICD-10-CM

## 2013-11-16 DIAGNOSIS — R590 Localized enlarged lymph nodes: Secondary | ICD-10-CM

## 2013-11-16 MED ORDER — PEGFILGRASTIM INJECTION 6 MG/0.6ML
6.0000 mg | Freq: Once | SUBCUTANEOUS | Status: AC
  Administered 2013-11-16: 6 mg via SUBCUTANEOUS
  Filled 2013-11-16: qty 0.6

## 2013-11-22 ENCOUNTER — Ambulatory Visit (HOSPITAL_COMMUNITY)
Admission: RE | Admit: 2013-11-22 | Discharge: 2013-11-22 | Disposition: A | Discharge status: Home / Self Care | Payer: 59 | Source: Ambulatory Visit | Attending: Oncology | Admitting: Oncology

## 2013-11-22 ENCOUNTER — Encounter (HOSPITAL_COMMUNITY): Payer: Self-pay

## 2013-11-22 DIAGNOSIS — C8589 Other specified types of non-Hodgkin lymphoma, extranodal and solid organ sites: Secondary | ICD-10-CM | POA: Insufficient documentation

## 2013-11-22 DIAGNOSIS — R197 Diarrhea, unspecified: Secondary | ICD-10-CM | POA: Insufficient documentation

## 2013-11-22 DIAGNOSIS — R109 Unspecified abdominal pain: Secondary | ICD-10-CM | POA: Insufficient documentation

## 2013-11-22 DIAGNOSIS — C8110 Nodular sclerosis classical Hodgkin lymphoma, unspecified site: Secondary | ICD-10-CM

## 2013-11-22 MED ORDER — IOHEXOL 300 MG/ML  SOLN
125.0000 mL | Freq: Once | INTRAMUSCULAR | Status: AC | PRN
  Administered 2013-11-22: 125 mL via INTRAVENOUS

## 2013-11-29 ENCOUNTER — Ambulatory Visit (HOSPITAL_BASED_OUTPATIENT_CLINIC_OR_DEPARTMENT_OTHER): Payer: 59 | Admitting: Oncology

## 2013-11-29 ENCOUNTER — Ambulatory Visit (HOSPITAL_BASED_OUTPATIENT_CLINIC_OR_DEPARTMENT_OTHER): Payer: 59

## 2013-11-29 VITALS — BP 137/64 | HR 88 | Temp 97.8°F | Resp 18

## 2013-11-29 DIAGNOSIS — Z5111 Encounter for antineoplastic chemotherapy: Secondary | ICD-10-CM

## 2013-11-29 DIAGNOSIS — R590 Localized enlarged lymph nodes: Secondary | ICD-10-CM

## 2013-11-29 DIAGNOSIS — C8190 Hodgkin lymphoma, unspecified, unspecified site: Secondary | ICD-10-CM

## 2013-11-29 DIAGNOSIS — C8119 Nodular sclerosis classical Hodgkin lymphoma, extranodal and solid organ sites: Secondary | ICD-10-CM

## 2013-11-29 DIAGNOSIS — C8110 Nodular sclerosis classical Hodgkin lymphoma, unspecified site: Secondary | ICD-10-CM

## 2013-11-29 LAB — CBC WITH DIFFERENTIAL/PLATELET
BASO%: 0.5 % (ref 0.0–2.0)
BASOS ABS: 0.1 10*3/uL (ref 0.0–0.1)
EOS%: 0.4 % (ref 0.0–7.0)
Eosinophils Absolute: 0.1 10*3/uL (ref 0.0–0.5)
HEMATOCRIT: 38.9 % (ref 34.8–46.6)
HEMOGLOBIN: 12.5 g/dL (ref 11.6–15.9)
LYMPH#: 1.1 10*3/uL (ref 0.9–3.3)
LYMPH%: 7.1 % — ABNORMAL LOW (ref 14.0–49.7)
MCH: 25.9 pg (ref 25.1–34.0)
MCHC: 32.1 g/dL (ref 31.5–36.0)
MCV: 80.7 fL (ref 79.5–101.0)
MONO#: 0.6 10*3/uL (ref 0.1–0.9)
MONO%: 4.1 % (ref 0.0–14.0)
NEUT#: 13.7 10*3/uL — ABNORMAL HIGH (ref 1.5–6.5)
NEUT%: 87.9 % — ABNORMAL HIGH (ref 38.4–76.8)
Platelets: 209 10*3/uL (ref 145–400)
RBC: 4.82 10*6/uL (ref 3.70–5.45)
RDW: 16.7 % — ABNORMAL HIGH (ref 11.2–14.5)
WBC: 15.6 10*3/uL — AB (ref 3.9–10.3)
nRBC: 0 % (ref 0–0)

## 2013-11-29 MED ORDER — DOXORUBICIN HCL CHEMO IV INJECTION 2 MG/ML
25.0000 mg/m2 | Freq: Once | INTRAVENOUS | Status: AC
  Administered 2013-11-29: 56 mg via INTRAVENOUS
  Filled 2013-11-29: qty 28

## 2013-11-29 MED ORDER — SODIUM CHLORIDE 0.9 % IV SOLN
375.0000 mg/m2 | Freq: Once | INTRAVENOUS | Status: AC
  Administered 2013-11-29: 820 mg via INTRAVENOUS
  Filled 2013-11-29: qty 41

## 2013-11-29 MED ORDER — DEXAMETHASONE SODIUM PHOSPHATE 20 MG/5ML IJ SOLN
INTRAMUSCULAR | Status: AC
  Filled 2013-11-29: qty 5

## 2013-11-29 MED ORDER — SODIUM CHLORIDE 0.9 % IV SOLN
150.0000 mg | Freq: Once | INTRAVENOUS | Status: AC
  Administered 2013-11-29: 150 mg via INTRAVENOUS
  Filled 2013-11-29: qty 5

## 2013-11-29 MED ORDER — PALONOSETRON HCL INJECTION 0.25 MG/5ML
INTRAVENOUS | Status: AC
  Filled 2013-11-29: qty 5

## 2013-11-29 MED ORDER — DEXAMETHASONE SODIUM PHOSPHATE 20 MG/5ML IJ SOLN
12.0000 mg | Freq: Once | INTRAMUSCULAR | Status: AC
  Administered 2013-11-29: 12 mg via INTRAVENOUS

## 2013-11-29 MED ORDER — SODIUM CHLORIDE 0.9 % IV SOLN
Freq: Once | INTRAVENOUS | Status: AC
  Administered 2013-11-29: 11:00:00 via INTRAVENOUS

## 2013-11-29 MED ORDER — SODIUM CHLORIDE 0.9 % IV SOLN
10.0000 [IU]/m2 | Freq: Once | INTRAVENOUS | Status: AC
  Administered 2013-11-29: 22 [IU] via INTRAVENOUS
  Filled 2013-11-29: qty 7.33

## 2013-11-29 MED ORDER — HEPARIN SOD (PORK) LOCK FLUSH 100 UNIT/ML IV SOLN
500.0000 [IU] | Freq: Once | INTRAVENOUS | Status: AC | PRN
  Administered 2013-11-29: 500 [IU]
  Filled 2013-11-29: qty 5

## 2013-11-29 MED ORDER — VINBLASTINE SULFATE CHEMO INJECTION 1 MG/ML
5.9000 mg/m2 | Freq: Once | INTRAVENOUS | Status: AC
  Administered 2013-11-29: 13 mg via INTRAVENOUS
  Filled 2013-11-29: qty 13

## 2013-11-29 MED ORDER — PALONOSETRON HCL INJECTION 0.25 MG/5ML
0.2500 mg | Freq: Once | INTRAVENOUS | Status: AC
  Administered 2013-11-29: 0.25 mg via INTRAVENOUS

## 2013-11-29 MED ORDER — SODIUM CHLORIDE 0.9 % IJ SOLN
10.0000 mL | INTRAMUSCULAR | Status: DC | PRN
  Administered 2013-11-29: 10 mL
  Filled 2013-11-29: qty 10

## 2013-11-29 NOTE — Patient Instructions (Signed)
[facility] Discharge Instructions for Patients Receiving Chemotherapy  Today you received the following chemotherapy agents Adriamycin, Vinblastine, Bleomycin, and DTIC.  To help prevent nausea and vomiting after your treatment, we encourage you to take your nausea medication.   If you develop nausea and vomiting that is not controlled by your nausea medication, call the clinic.   BELOW ARE SYMPTOMS THAT SHOULD BE REPORTED IMMEDIATELY:  *FEVER GREATER THAN 100.5 F  *CHILLS WITH OR WITHOUT FEVER  NAUSEA AND VOMITING THAT IS NOT CONTROLLED WITH YOUR NAUSEA MEDICATION  *UNUSUAL SHORTNESS OF BREATH  *UNUSUAL BRUISING OR BLEEDING  TENDERNESS IN MOUTH AND THROAT WITH OR WITHOUT PRESENCE OF ULCERS  *URINARY PROBLEMS  *BOWEL PROBLEMS  UNUSUAL RASH Items with * indicate a potential emergency and should be followed up as soon as possible.  Feel free to call the clinic you have any questions or concerns. The clinic phone number is [phone].

## 2013-12-02 ENCOUNTER — Telehealth: Payer: Self-pay | Admitting: *Deleted

## 2013-12-02 NOTE — Telephone Encounter (Signed)
Miranda Villegas's Dad, [name], called and left message.  She had her treatment on Monday and did very well.  She woke up yesterday with a head cold, stuffy, green mucus.  He is wondering if he should go ahead and fill the script for cipro?   Discussed with Dr. Beryle Beams - dont fill now -probably a head cold.   Called [name] back. And left message with ?.  He called back and left message that she is not running any fever and actually feels better today.  He said it is a "false alarm". Called him back and LM -  Let him know that Dr. Beryle Beams did not think she needed the cipro at this point, however if her sx progressed and she got worse to please call us.

## 2013-12-06 ENCOUNTER — Other Ambulatory Visit: Payer: Self-pay | Admitting: Oncology

## 2013-12-13 ENCOUNTER — Other Ambulatory Visit: Payer: 59

## 2013-12-13 ENCOUNTER — Telehealth: Payer: Self-pay | Admitting: Oncology

## 2013-12-13 ENCOUNTER — Ambulatory Visit: Payer: 59 | Admitting: Nurse Practitioner

## 2013-12-13 ENCOUNTER — Ambulatory Visit (HOSPITAL_BASED_OUTPATIENT_CLINIC_OR_DEPARTMENT_OTHER): Payer: 59 | Admitting: Oncology

## 2013-12-13 ENCOUNTER — Other Ambulatory Visit (HOSPITAL_BASED_OUTPATIENT_CLINIC_OR_DEPARTMENT_OTHER): Payer: 59

## 2013-12-13 ENCOUNTER — Ambulatory Visit (HOSPITAL_BASED_OUTPATIENT_CLINIC_OR_DEPARTMENT_OTHER): Payer: 59

## 2013-12-13 VITALS — BP 130/69 | HR 79 | Temp 97.1°F | Resp 18 | Ht 69.0 in | Wt 242.6 lb

## 2013-12-13 DIAGNOSIS — C8119 Nodular sclerosis classical Hodgkin lymphoma, extranodal and solid organ sites: Secondary | ICD-10-CM

## 2013-12-13 DIAGNOSIS — Z5111 Encounter for antineoplastic chemotherapy: Secondary | ICD-10-CM

## 2013-12-13 DIAGNOSIS — C8110 Nodular sclerosis classical Hodgkin lymphoma, unspecified site: Secondary | ICD-10-CM

## 2013-12-13 DIAGNOSIS — C8190 Hodgkin lymphoma, unspecified, unspecified site: Secondary | ICD-10-CM

## 2013-12-13 DIAGNOSIS — R590 Localized enlarged lymph nodes: Secondary | ICD-10-CM

## 2013-12-13 LAB — COMPREHENSIVE METABOLIC PANEL (CC13)
ALBUMIN: 3.7 g/dL (ref 3.5–5.0)
ALT: 20 U/L (ref 0–55)
AST: 13 U/L (ref 5–34)
Alkaline Phosphatase: 80 U/L (ref 40–150)
Anion Gap: 13 mEq/L — ABNORMAL HIGH (ref 3–11)
BUN: 11.3 mg/dL (ref 7.0–26.0)
CALCIUM: 9.4 mg/dL (ref 8.4–10.4)
CO2: 23 meq/L (ref 22–29)
Chloride: 107 mEq/L (ref 98–109)
Creatinine: 0.8 mg/dL (ref 0.6–1.1)
GLUCOSE: 97 mg/dL (ref 70–140)
POTASSIUM: 4.2 meq/L (ref 3.5–5.1)
Sodium: 142 mEq/L (ref 136–145)
Total Bilirubin: 0.21 mg/dL (ref 0.20–1.20)
Total Protein: 6.5 g/dL (ref 6.4–8.3)

## 2013-12-13 LAB — CBC WITH DIFFERENTIAL/PLATELET
BASO%: 1.7 % (ref 0.0–2.0)
BASOS ABS: 0 10*3/uL (ref 0.0–0.1)
EOS ABS: 0 10*3/uL (ref 0.0–0.5)
EOS%: 2.6 % (ref 0.0–7.0)
HCT: 34.6 % — ABNORMAL LOW (ref 34.8–46.6)
HEMOGLOBIN: 11.2 g/dL — AB (ref 11.6–15.9)
LYMPH%: 38.8 % (ref 14.0–49.7)
MCH: 26.1 pg (ref 25.1–34.0)
MCHC: 32.5 g/dL (ref 31.5–36.0)
MCV: 80.3 fL (ref 79.5–101.0)
MONO#: 0.3 10*3/uL (ref 0.1–0.9)
MONO%: 14.6 % — AB (ref 0.0–14.0)
NEUT%: 42.3 % (ref 38.4–76.8)
NEUTROS ABS: 0.8 10*3/uL — AB (ref 1.5–6.5)
Platelets: 324 10*3/uL (ref 145–400)
RBC: 4.3 10*6/uL (ref 3.70–5.45)
RDW: 16.4 % — AB (ref 11.2–14.5)
WBC: 1.8 10*3/uL — ABNORMAL LOW (ref 3.9–10.3)
lymph#: 0.7 10*3/uL — ABNORMAL LOW (ref 0.9–3.3)

## 2013-12-13 MED ORDER — VINBLASTINE SULFATE CHEMO INJECTION 1 MG/ML
5.9300 mg/m2 | Freq: Once | INTRAVENOUS | Status: AC
  Administered 2013-12-13: 13 mg via INTRAVENOUS
  Filled 2013-12-13: qty 13

## 2013-12-13 MED ORDER — DEXAMETHASONE SODIUM PHOSPHATE 20 MG/5ML IJ SOLN
INTRAMUSCULAR | Status: AC
  Filled 2013-12-13: qty 5

## 2013-12-13 MED ORDER — SODIUM CHLORIDE 0.9 % IV SOLN
10.0000 [IU]/m2 | Freq: Once | INTRAVENOUS | Status: AC
  Administered 2013-12-13: 22 [IU] via INTRAVENOUS
  Filled 2013-12-13: qty 7.33

## 2013-12-13 MED ORDER — SODIUM CHLORIDE 0.9 % IJ SOLN
10.0000 mL | INTRAMUSCULAR | Status: DC | PRN
  Administered 2013-12-13: 10 mL
  Filled 2013-12-13: qty 10

## 2013-12-13 MED ORDER — DEXAMETHASONE SODIUM PHOSPHATE 20 MG/5ML IJ SOLN
12.0000 mg | Freq: Once | INTRAMUSCULAR | Status: AC
  Administered 2013-12-13: 12 mg via INTRAVENOUS

## 2013-12-13 MED ORDER — SODIUM CHLORIDE 0.9 % IV SOLN
Freq: Once | INTRAVENOUS | Status: AC
  Administered 2013-12-13: 12:00:00 via INTRAVENOUS

## 2013-12-13 MED ORDER — SODIUM CHLORIDE 0.9 % IV SOLN
375.0000 mg/m2 | Freq: Once | INTRAVENOUS | Status: AC
  Administered 2013-12-13: 820 mg via INTRAVENOUS
  Filled 2013-12-13: qty 41

## 2013-12-13 MED ORDER — HEPARIN SOD (PORK) LOCK FLUSH 100 UNIT/ML IV SOLN
500.0000 [IU] | Freq: Once | INTRAVENOUS | Status: AC | PRN
  Administered 2013-12-13: 500 [IU]
  Filled 2013-12-13: qty 5

## 2013-12-13 MED ORDER — PALONOSETRON HCL INJECTION 0.25 MG/5ML
INTRAVENOUS | Status: AC
  Filled 2013-12-13: qty 5

## 2013-12-13 MED ORDER — SODIUM CHLORIDE 0.9 % IV SOLN
150.0000 mg | Freq: Once | INTRAVENOUS | Status: AC
  Administered 2013-12-13: 150 mg via INTRAVENOUS
  Filled 2013-12-13: qty 5

## 2013-12-13 MED ORDER — DOXORUBICIN HCL CHEMO IV INJECTION 2 MG/ML
25.0000 mg/m2 | Freq: Once | INTRAVENOUS | Status: AC
Start: 2013-12-13 — End: 2013-12-13
  Administered 2013-12-13: 56 mg via INTRAVENOUS
  Filled 2013-12-13: qty 28

## 2013-12-13 MED ORDER — PALONOSETRON HCL INJECTION 0.25 MG/5ML
0.2500 mg | Freq: Once | INTRAVENOUS | Status: AC
  Administered 2013-12-13: 0.25 mg via INTRAVENOUS

## 2013-12-13 NOTE — Progress Notes (Signed)
Hematology and Oncology Follow Up Visit  Miranda Villegas [id number] [birth year] 22 y.o. 12/13/2013 10:40 AM   Principle Diagnosis: Encounter Diagnoses  Name Primary?  . Hodgkin's lymphoma, nodular sclerosis Yes  . Encounter for antineoplastic chemotherapy      Interim History:   Follow up visit for this pleasant 23 year old woman on active treatment for stage IIIB nodular sclerosing Hodgkin's lymphoma. She presented in November 2014 with a 5 month history of progressive axillary and cervical lymphadenopathy and then rapidly progressive respiratory distress secondary to a large 5.5 x 13.7 cm anterior mediastinal mass and bulky bilateral, right greater than left, cervical lymphadenopathy with edema and narrowing of the vocal cord. There was no abdominal or pelvic adenopathy but there were 2 lesions in the spleen, largest 2.1 cm suspicious for involvement. We were not able to obtain authorization for a inpatient PET scan and the patient needed emergent treatment. She received the first dose of ABVD in the hospital on 08/14/2013. PET scan done subsequently as an outpatient was already showing a response with minimal hypermetabolic activity in the areas of known nodal disease on the CT scans and no abnormal activity over the spleen.  She had B. symptoms at presentation with night sweats and approximate 15 pound weight loss.  Bone marrow biopsy not done.  She has now had 4 full cycles of treatment. One treatment was delayed one week. Neulasta added to her regimen. She has subsequently been treated on time although I am treating at low ANCs.  Complications of treatment so far include initial severe constipation. She is currently on a aggressive laxative regimen which is helping. She is now moving her bowels on a regular basis. She's had significant delayed nausea despite use of multiple antiemetics. This has also improved over time with modification of her antiemetic regimen to include Aloxi and  Emend. She's had some reflux symptoms which responds to PPIs and antacids.  She developed a mild influenza syndrome treated with Tamiflu and steroids on new years day and did not require any dose delays or reductions.  She has not had any respiratory symptoms on the bleomycin component of her chemotherapy program. She has no new complaints today. She is having a flareup of seasonal rhinitis/sinusitis. No fevers. No cough. No dyspnea. No paresthesias.  I obtained a CT scan on March 2 and compared with pretreatment studies done back in November she has had a dramatic response to treatment with a near complete response in all areas including her spleen. She will get a PET scan after her sixth and final planned cycle.    Medications: reviewed  Allergies:  Allergies  Allergen Reactions  . Adhesive [Tape]     Rash    Review of Systems: Hematology:  No bleeding ENT ROS: Flareup of seasonal rhinitis/sinusitis Breast ROS:  Respiratory ROS: No cough or dyspnea Cardiovascular ROS: No chest pain or palpitations  Gastrointestinal ROS:   No abdominal pain. Now moving her bowels on a regular basis. Genito-Urinary ROS: Not questioned Musculoskeletal ROS: No muscle bone or joint pain Neurological ROS: No headache or change in vision Dermatological ROS: No rash or ecchymosis Remaining ROS negative:   Physical Exam: Blood pressure 130/69, pulse 79, temperature 97.1 F (36.2 C), temperature source Oral, resp. rate 18, height _0  (1.753 m), weight 242 lb 9.6 oz (110.043 kg), last menstrual period 11/05/2013, SpO2 100.00%. Wt Readings from Last 3 Encounters:  12/13/13 242 lb 9.6 oz (110.043 kg)  11/15/13 238 lb 12.8 oz (108.319 kg)  10/15/13  231 lb 8 oz (105.008 kg)     General appearance: Well-nourished Caucasian woman HENNT: Nose ring and  lip ring. Pharynx no erythema, exudate, mass, or ulcer. No thyromegaly or thyroid nodules Lymph nodes: No cervical, supraclavicular, or axillary  lymphadenopathy: Now with complete regression of previous cervical and left axillary nodes Breasts:  Lungs: Clear to auscultation, resonant to percussion throughout Heart: Regular rhythm, no murmur, no gallop, no rub, no click, no edema Abdomen: Soft, nontender, normal bowel sounds, no mass, no organomegaly Extremities: No edema, no calf tenderness Musculoskeletal: no joint deformities GU:  Vascular: Carotid pulses 2+, no bruits, Neurologic: Alert, oriented, PERRLA,   cranial nerves grossly normal, motor strength 5 over 5, reflexes 1+ symmetric, upper body coordination normal, gait normal, Skin: No rash or ecchymosis  Lab Results: CBC W/Diff    Component Value Date/Time   WBC 1.8* 12/13/2013 0947   WBC 1.7* 09/09/2013 0940   RBC 4.30 12/13/2013 0947   RBC 4.65 09/09/2013 0940   RBC 4.13 08/12/2013 1255   HGB 11.2* 12/13/2013 0947   HGB 10.8* 09/09/2013 0940   HCT 34.6* 12/13/2013 0947   HCT 35.2* 09/09/2013 0940   PLT 324 12/13/2013 0947   PLT 289 09/09/2013 0940   MCV 80.3 12/13/2013 0947   MCV 75.7* 09/09/2013 0940   MCH 26.1 12/13/2013 0947   MCH 23.2* 09/09/2013 0940   MCHC 32.5 12/13/2013 0947   MCHC 30.7 09/09/2013 0940   RDW 16.4* 12/13/2013 0947   RDW 18.2* 09/09/2013 0940   LYMPHSABS 0.7* 12/13/2013 0947   LYMPHSABS 0.6* 08/12/2013 1255   MONOABS 0.3 12/13/2013 0947   MONOABS 0.7 08/12/2013 1255   EOSABS 0.0 12/13/2013 0947   EOSABS 0.1 08/12/2013 1255   BASOSABS 0.0 12/13/2013 0947   BASOSABS 0.0 08/12/2013 1255     Chemistry      Component Value Date/Time   NA 142 12/13/2013 0948   NA 138 09/09/2013 0940   K 4.2 12/13/2013 0948   K 4.1 09/09/2013 0940   CL 105 09/09/2013 0940   CO2 23 12/13/2013 0948   CO2 24 09/09/2013 0940   BUN 11.3 12/13/2013 0948   BUN 15 09/09/2013 0940   CREATININE 0.8 12/13/2013 0948   CREATININE 0.71 09/09/2013 0940      Component Value Date/Time   CALCIUM 9.4 12/13/2013 0948   CALCIUM 9.0 09/09/2013 0940   ALKPHOS 80 12/13/2013 0948    ALKPHOS 85 08/15/2013 0515   AST 13 12/13/2013 0948   AST 8 08/15/2013 0515   ALT 20 12/13/2013 0948   ALT 16 08/15/2013 0515   BILITOT 0.21 12/13/2013 0948   BILITOT 0.1* 08/15/2013 0515       Radiological Studies: Ct Soft Tissue Neck W Contrast  11/22/2013   CLINICAL DATA:  Lymphoma diagnosed November 2014. Chemotherapy ongoing. Abdominal pain and diarrhea.  EXAM: CT ABDOMEN AND PELVIS WITH CONTRAST; CT CHEST WITH CONTRAST; CT NECK WITH CONTRAST  TECHNIQUE: Multidetector CT imaging of the neck was performed with intravenous contrast.; Multidetector CT imaging of the abdomen and pelvis was performed following the standard protocol during bolus administration of intravenous contrast.; Multidetector CT imaging of the chest was performed following the standard protocol during bolus administration of intravenous contrast.  CONTRAST:  182m OMNIPAQUE IOHEXOL 300 MG/ML  SOLN  COMPARISON:  CT ABD/PELVIS W CM dated 08/13/2013; CT NECK W/CM dated 08/12/2013; CT CHEST W/CM dated 07/30/2013  FINDINGS: CT NECK FINDINGS  There is marked reduction in the bulky right cervical adenopathy seen on  comparison exam. Right cervical lymph nodes are less than a 10 mm short axis. Largest lymph node measures 7 mm short axis (image 72, series 7) in the right level IIa position. The salivary glands are normal. Limited view of the lower brain is normal. The orbits are normal. No aggressive osseous lesion.  CT CHEST FINDINGS  Marked reduction mediastinal lymphadenopathy. Bulky adenopathy does remain. Prevascular lymph node measures 23 mm (image 17, series 2) compared to 49 mm on prior. Right lower paratracheal lymph node measures 23 mm compared to 44 mm on prior. No new adenopathy. No pericardial fluid.  Review of the lung parenchyma demonstrates no pulmonary nodularity. Airways are normal.  Interval decrease in the bulky left axillary adenopathy. Small 10 mm lymph nodes remain at site of bulky 23 mm nodes on prior.  CT ABDOMEN AND  PELVIS FINDINGS  The spleen is normal volume without focal lesion. Low-density lesion seen in the central portion of the spleen on comparison exam is no longer evident. No focal hepatic lesion. No periportal or retroperitoneal lymphadenopathy. No pelvic or inguinal lymphadenopathy.  Stomach, small bowel, appendix, and colon are normal. Adrenal glands and kidneys are normal. The pancreas is normal.  The uterus and ovaries are normal. The bladder is normal. No pelvic lymphadenopathy. No aggressive osseous lesion.  IMPRESSION: 1. Marked reduction in the right cervical lymphadenopathy. Remaining lymph nodes are less than 10 mm short axis. 2. Persistent bulky mediastinal nodal metastasis ; however the volume of the adenopathy has decreased markedly compared exam. 3. Marked reduction in left axillary adenopathy. 4. No evidence of new disease within the chest, abdomen, or pelvis. 5. Spleen appears normal.   Electronically Signed   By: Suzy Bouchard M.D.   On: 11/22/2013 13:35      Impression:  Stage IIIB, nodular sclerosing Hodgkin's lymphoma Clinically and radiographically responding to treatment. There is now been complete resolution of clinically palpable residual adenopathy in the left axilla. I am not making any dose reductions or any dose delays. I will go ahead and treat today with her ANC of 0.8. She gets Neulasta on day 2 of each 28 day cycle. She'll begin cycle 5 today.  Dr. Alvy Bimler will assume her care.   CC: Patient Care Team: Berkley Harvey as PCP - General (Nurse Practitioner) Izora Gala, MD as Consulting Physician (Otolaryngology) Melrose Nakayama, MD as Consulting Physician (Cardiothoracic Surgery)   Annia Belt, MD 3/23/201510:40 AM

## 2013-12-13 NOTE — Progress Notes (Signed)
Okay to treat today per Dr. Beryle Beams with ANC 0.8. Cindi Carbon, RN

## 2013-12-13 NOTE — Telephone Encounter (Signed)
gv adn printed aptp sched and avs forpt for March thru May....Marland Kitchensed added tx....pt just wanted the 4.8.15 appt with MD not the 4.13-15 appt.Marland KitchenMarland KitchenMarland Kitchen

## 2013-12-13 NOTE — Patient Instructions (Signed)
[facility] Discharge Instructions for Patients Receiving Chemotherapy  Today you received the following chemotherapy agents: Adriamycin, Bleomycin, Vinblastine, DTIC  To help prevent nausea and vomiting after your treatment, we encourage you to take your nausea medication as prescribed.    If you develop nausea and vomiting that is not controlled by your nausea medication, call the clinic.   BELOW ARE SYMPTOMS THAT SHOULD BE REPORTED IMMEDIATELY:  *FEVER GREATER THAN 100.5 F  *CHILLS WITH OR WITHOUT FEVER  NAUSEA AND VOMITING THAT IS NOT CONTROLLED WITH YOUR NAUSEA MEDICATION  *UNUSUAL SHORTNESS OF BREATH  *UNUSUAL BRUISING OR BLEEDING  TENDERNESS IN MOUTH AND THROAT WITH OR WITHOUT PRESENCE OF ULCERS  *URINARY PROBLEMS  *BOWEL PROBLEMS  UNUSUAL RASH Items with * indicate a potential emergency and should be followed up as soon as possible.  Feel free to call the clinic you have any questions or concerns. The clinic phone number is [phone].    

## 2013-12-14 ENCOUNTER — Ambulatory Visit (HOSPITAL_BASED_OUTPATIENT_CLINIC_OR_DEPARTMENT_OTHER): Payer: 59

## 2013-12-14 VITALS — BP 122/74 | HR 87 | Temp 97.5°F

## 2013-12-14 DIAGNOSIS — C8190 Hodgkin lymphoma, unspecified, unspecified site: Secondary | ICD-10-CM

## 2013-12-14 DIAGNOSIS — C8119 Nodular sclerosis classical Hodgkin lymphoma, extranodal and solid organ sites: Secondary | ICD-10-CM

## 2013-12-14 DIAGNOSIS — R590 Localized enlarged lymph nodes: Secondary | ICD-10-CM

## 2013-12-14 DIAGNOSIS — Z5189 Encounter for other specified aftercare: Secondary | ICD-10-CM

## 2013-12-14 DIAGNOSIS — C8110 Nodular sclerosis classical Hodgkin lymphoma, unspecified site: Secondary | ICD-10-CM

## 2013-12-14 MED ORDER — PEGFILGRASTIM INJECTION 6 MG/0.6ML
6.0000 mg | Freq: Once | SUBCUTANEOUS | Status: AC
  Administered 2013-12-14: 6 mg via SUBCUTANEOUS
  Filled 2013-12-14: qty 0.6

## 2013-12-22 ENCOUNTER — Telehealth: Payer: Self-pay | Admitting: *Deleted

## 2013-12-22 NOTE — Telephone Encounter (Signed)
Father left a VM regarding the timing of pt's chemo schedule.  Asking if insurance will pay even if the next chemo cycle starts 12 days after the last treatment vs 14 days that usually falls between last tx and next cycle?  Left VM for father asking if he is referring to the next cycle starting after this coming tx on 4/08 then Dr. Alvy Bimler can adjust the schedule if needed on pts office visit 4/08.  If he has concerns about this coming treatment on 4/8 please call back to discuss further.  Informed him this is not usually an issue for insurance as long as the treatments are given according to protocol and there can be some leeway at MD's discretion.

## 2013-12-27 ENCOUNTER — Ambulatory Visit: Payer: 59 | Admitting: Hematology and Oncology

## 2013-12-27 ENCOUNTER — Other Ambulatory Visit: Payer: 59

## 2013-12-29 ENCOUNTER — Ambulatory Visit (HOSPITAL_BASED_OUTPATIENT_CLINIC_OR_DEPARTMENT_OTHER): Payer: 59 | Admitting: Hematology and Oncology

## 2013-12-29 ENCOUNTER — Other Ambulatory Visit (HOSPITAL_BASED_OUTPATIENT_CLINIC_OR_DEPARTMENT_OTHER): Payer: 59

## 2013-12-29 ENCOUNTER — Ambulatory Visit (HOSPITAL_BASED_OUTPATIENT_CLINIC_OR_DEPARTMENT_OTHER): Payer: 59

## 2013-12-29 ENCOUNTER — Other Ambulatory Visit: Payer: Self-pay

## 2013-12-29 ENCOUNTER — Ambulatory Visit: Payer: 59

## 2013-12-29 VITALS — BP 142/72 | HR 92 | Temp 98.2°F | Resp 18 | Ht 69.0 in | Wt 245.6 lb

## 2013-12-29 DIAGNOSIS — C8190 Hodgkin lymphoma, unspecified, unspecified site: Secondary | ICD-10-CM

## 2013-12-29 DIAGNOSIS — C8111 Nodular sclerosis classical Hodgkin lymphoma, lymph nodes of head, face, and neck: Secondary | ICD-10-CM

## 2013-12-29 DIAGNOSIS — R590 Localized enlarged lymph nodes: Secondary | ICD-10-CM

## 2013-12-29 DIAGNOSIS — Z5111 Encounter for antineoplastic chemotherapy: Secondary | ICD-10-CM

## 2013-12-29 DIAGNOSIS — C8110 Nodular sclerosis classical Hodgkin lymphoma, unspecified site: Secondary | ICD-10-CM

## 2013-12-29 LAB — CBC WITH DIFFERENTIAL/PLATELET
BASO%: 0.4 % (ref 0.0–2.0)
Basophils Absolute: 0 10*3/uL (ref 0.0–0.1)
EOS ABS: 0 10*3/uL (ref 0.0–0.5)
EOS%: 0.3 % (ref 0.0–7.0)
HCT: 40.1 % (ref 34.8–46.6)
HGB: 12.9 g/dL (ref 11.6–15.9)
LYMPH%: 13.3 % — ABNORMAL LOW (ref 14.0–49.7)
MCH: 26.5 pg (ref 25.1–34.0)
MCHC: 32.2 g/dL (ref 31.5–36.0)
MCV: 82.3 fL (ref 79.5–101.0)
MONO#: 0.7 10*3/uL (ref 0.1–0.9)
MONO%: 8.1 % (ref 0.0–14.0)
NEUT#: 7.1 10*3/uL — ABNORMAL HIGH (ref 1.5–6.5)
NEUT%: 77.9 % — ABNORMAL HIGH (ref 38.4–76.8)
NRBC: 0 % (ref 0–0)
Platelets: 225 10*3/uL (ref 145–400)
RBC: 4.87 10*6/uL (ref 3.70–5.45)
RDW: 15.8 % — AB (ref 11.2–14.5)
WBC: 9.1 10*3/uL (ref 3.9–10.3)
lymph#: 1.2 10*3/uL (ref 0.9–3.3)

## 2013-12-29 MED ORDER — SODIUM CHLORIDE 0.9 % IV SOLN
368.0000 mg/m2 | Freq: Once | INTRAVENOUS | Status: AC
  Administered 2013-12-29: 800 mg via INTRAVENOUS
  Filled 2013-12-29: qty 40

## 2013-12-29 MED ORDER — PALONOSETRON HCL INJECTION 0.25 MG/5ML
0.2500 mg | Freq: Once | INTRAVENOUS | Status: AC
  Administered 2013-12-29: 0.25 mg via INTRAVENOUS

## 2013-12-29 MED ORDER — DEXAMETHASONE SODIUM PHOSPHATE 20 MG/5ML IJ SOLN
12.0000 mg | Freq: Once | INTRAMUSCULAR | Status: AC
  Administered 2013-12-29: 12 mg via INTRAVENOUS

## 2013-12-29 MED ORDER — PALONOSETRON HCL INJECTION 0.25 MG/5ML
INTRAVENOUS | Status: AC
  Filled 2013-12-29: qty 5

## 2013-12-29 MED ORDER — DEXAMETHASONE SODIUM PHOSPHATE 20 MG/5ML IJ SOLN
INTRAMUSCULAR | Status: AC
  Filled 2013-12-29: qty 5

## 2013-12-29 MED ORDER — VINBLASTINE SULFATE CHEMO INJECTION 1 MG/ML
6.0000 mg/m2 | Freq: Once | INTRAVENOUS | Status: AC
  Administered 2013-12-29: 13 mg via INTRAVENOUS
  Filled 2013-12-29: qty 13

## 2013-12-29 MED ORDER — SODIUM CHLORIDE 0.9 % IJ SOLN
10.0000 mL | INTRAMUSCULAR | Status: DC | PRN
  Administered 2013-12-29: 10 mL
  Filled 2013-12-29: qty 10

## 2013-12-29 MED ORDER — SODIUM CHLORIDE 0.9 % IV SOLN
150.0000 mg | Freq: Once | INTRAVENOUS | Status: AC
  Administered 2013-12-29: 150 mg via INTRAVENOUS
  Filled 2013-12-29: qty 5

## 2013-12-29 MED ORDER — HEPARIN SOD (PORK) LOCK FLUSH 100 UNIT/ML IV SOLN
500.0000 [IU] | Freq: Once | INTRAVENOUS | Status: AC | PRN
  Administered 2013-12-29: 500 [IU]
  Filled 2013-12-29: qty 5

## 2013-12-29 MED ORDER — SODIUM CHLORIDE 0.9 % IV SOLN
Freq: Once | INTRAVENOUS | Status: AC
  Administered 2013-12-29: 14:00:00 via INTRAVENOUS

## 2013-12-29 MED ORDER — DOXORUBICIN HCL CHEMO IV INJECTION 2 MG/ML
25.0000 mg/m2 | Freq: Once | INTRAVENOUS | Status: AC
  Administered 2013-12-29: 56 mg via INTRAVENOUS
  Filled 2013-12-29: qty 28

## 2013-12-29 MED ORDER — BLEOMYCIN SULFATE CHEMO INJECTION 30 UNIT
10.0000 [IU]/m2 | Freq: Once | INTRAMUSCULAR | Status: AC
  Administered 2013-12-29: 22 [IU] via INTRAVENOUS
  Filled 2013-12-29: qty 7.33

## 2013-12-29 NOTE — Progress Notes (Signed)
Patient stated she felt burning momentarily around [facility] site the last time adriamycin was infused. Blood return checked frequently while pushing adriamycin today and patient stated PAC site did not burn today. Excellent blood return given right before patient discharged and patient stated she did not notice any burning during remainder of chemo treatment today. Cindi Carbon, RN

## 2013-12-29 NOTE — Patient Instructions (Signed)
[facility] Discharge Instructions for Patients Receiving Chemotherapy  Today you received the following chemotherapy agents: Adriamycin, Vinblastine, Bleomycin, DTIC  To help prevent nausea and vomiting after your treatment, we encourage you to take your nausea medication as prescribed.    If you develop nausea and vomiting that is not controlled by your nausea medication, call the clinic.   BELOW ARE SYMPTOMS THAT SHOULD BE REPORTED IMMEDIATELY:  *FEVER GREATER THAN 100.5 F  *CHILLS WITH OR WITHOUT FEVER  NAUSEA AND VOMITING THAT IS NOT CONTROLLED WITH YOUR NAUSEA MEDICATION  *UNUSUAL SHORTNESS OF BREATH  *UNUSUAL BRUISING OR BLEEDING  TENDERNESS IN MOUTH AND THROAT WITH OR WITHOUT PRESENCE OF ULCERS  *URINARY PROBLEMS  *BOWEL PROBLEMS  UNUSUAL RASH Items with * indicate a potential emergency and should be followed up as soon as possible.  Feel free to call the clinic you have any questions or concerns. The clinic phone number is [phone].    

## 2013-12-29 NOTE — Progress Notes (Signed)
[place] progress notes  Patient Care Team: Berkley Harvey, NP as PCP - General (Nurse Practitioner) Izora Gala, MD as Consulting Physician (Otolaryngology) Melrose Nakayama, MD as Consulting Physician (Cardiothoracic Surgery)  CHIEF COMPLAINTS/PURPOSE OF VISIT:  Hodgkin's lymphoma, for further management  HISTORY OF PRESENTING ILLNESS:  Miranda Villegas 23 y.o. female was transferred to my care after her prior physician has left. The patient was diagnosed with Hodgkin lymphoma of the presentation with bulky lymphadenopathy and narrowing of the vocal cord. The patient had B. symptoms at presentation with night sweats and 15 pound weight loss. Bone marrow biopsy was not done due to the emergency situation requiring treatment to be started as soon as possible. Oncology History   Hodgkin's lymphoma, nodular sclerosis   Primary site: Lymphoid Neoplasms   Staging method: AJCC 6th Edition   Clinical: Stage III signed by Heath Lark, MD on 12/29/2013  8:11 PM   Pathologic: Stage III signed by Heath Lark, MD on 12/29/2013  8:11 PM   Summary: Stage III       Hodgkin's lymphoma, nodular sclerosis   07/30/2013 Imaging CT scans show disease on both sides of the diaphragm with splenic involvement.   08/09/2013 Surgery Mediastinoscopy and biopsy confirmed classical Hodgkin lymphoma   08/14/2013 -  Chemotherapy Patient was started on treatment with ABVD   11/22/2013 Imaging Repeat staging CT scans showed marked response to treatment.   With her last 2 cycles of treatment, she required Neulasta due to leukopenia. The patient also had history of complications with severe constipation requiring multiple laxatives and severe nausea requiring strong anti-emetics. With the last treatment, she had minimum side effects. She still has mild nausea but no vomiting. Denies any new palpable lymphadenopathy. She denies any recent fever, chills, night sweats or abnormal weight  loss   MEDICAL HISTORY:  Past Medical History  Diagnosis Date  . Mass in neck     right side   . Anxiety   . Depression     as a 23 year old  . hodgkins lymphoma 08/12/13    lymphoma recently dx    SURGICAL HISTORY: Past Surgical History  Procedure Laterality Date  . Tooth extraction    . Lymph node biopsy  08/09/13    medial neck area - lymphoma dx.  . Mediastinoscopy N/A 08/09/2013    Procedure: MEDIASTINOSCOPY;  Surgeon: Melrose Nakayama, MD;  Location: [facility];  Service: Thoracic;  Laterality: N/A;    SOCIAL HISTORY: History   Social History  . Marital Status: Single    Spouse Name: N/A    Number of Children: N/A  . Years of Education: N/A   Occupational History  . Not on file.   Social History Main Topics  . Smoking status: Never Smoker   . Smokeless tobacco: Never Used  . Alcohol Use: No  . Drug Use: No  . Sexual Activity: Not on file   Other Topics Concern  . Not on file   Social History Narrative  . No narrative on file    FAMILY HISTORY: Family History  Problem Relation Age of Onset  . Alzheimer's disease    . Osteoarthritis Mother   . Heart disease    . Hearing loss    . Migraines Mother   . Parkinsonism    . Breast cancer    . Transient ischemic attack    . Autoimmune disease      ALLERGIES:  is allergic to adhesive.  MEDICATIONS:  Current Outpatient Prescriptions  Medication Sig Dispense Refill  . ibuprofen (ADVIL,MOTRIN) 200 MG tablet Take 400-600 mg by mouth every 6 (six) hours as needed for fever, headache, mild pain, moderate pain or cramping.       . lidocaine-prilocaine (EMLA) cream Apply 1 application topically as needed. Apply to portacath site 1-2 hours prior to use.  30 g  3  . loratadine (CLARITIN) 10 MG tablet Take 10 mg by mouth daily.       Marland Kitchen LORazepam (ATIVAN) 1 MG tablet Take 1 tablet (1 mg total) by mouth every 6 (six) hours as needed for anxiety or sleep.  30 tablet  0  . ondansetron (ZOFRAN ODT) 8 MG  disintegrating tablet Take 1 tablet (8 mg total) by mouth every 8 (eight) hours as needed for nausea or vomiting (Take every 8 hours for the next 48 hours, then as needed.).  30 tablet  3  . oxycodone (OXY-IR) 5 MG capsule Take 1 capsule (5 mg total) by mouth every 4 (four) hours as needed.  30 capsule  0  . polyethylene glycol (MIRALAX / GLYCOLAX) packet Take 17 g by mouth 2 (two) times daily.      Marland Kitchen triamcinolone cream (KENALOG) 0.1 % Apply 1 application topically daily as needed (for eczema flare ups).       No current facility-administered medications for this visit.    REVIEW OF SYSTEMS:   Constitutional: Denies fevers, chills or abnormal night sweats Eyes: Denies blurriness of vision, double vision or watery eyes Ears, nose, mouth, throat, and face: Denies mucositis or sore throat Respiratory: Denies cough, dyspnea or wheezes Cardiovascular: Denies palpitation, chest discomfort or lower extremity swelling Skin: Denies abnormal skin rashes Lymphatics: Denies new lymphadenopathy or easy bruising Neurological:Denies numbness, tingling or new weaknesses Behavioral/Psych: Mood is stable, no new changes  All other systems were reviewed with the patient and are negative.  PHYSICAL EXAMINATION: ECOG PERFORMANCE STATUS: 0 - Asymptomatic  Filed Vitals:   12/29/13 1238  BP: 142/72  Pulse: 92  Temp: 98.2 F (36.8 C)  Resp: 18   Filed Weights   12/29/13 1238  Weight: 245 lb 9.6 oz (111.403 kg)    GENERAL:alert, no distress and comfortable. She is morbidly obese SKIN: skin color, texture, turgor are normal, no rashes or significant lesions EYES: normal, conjunctiva are pink and non-injected, sclera clear OROPHARYNX:no exudate, normal lips, buccal mucosa, and tongue  NECK: supple, thyroid normal size, non-tender, without nodularity LYMPH:  no palpable lymphadenopathy in the cervical, axillary or inguinal LUNGS: clear to auscultation and percussion with normal breathing effort HEART:  regular rate & rhythm and no murmurs without lower extremity edema ABDOMEN:abdomen soft, non-tender and normal bowel sounds Musculoskeletal:no cyanosis of digits and no clubbing  PSYCH: alert & oriented x 3 with fluent speech NEURO: no focal motor/sensory deficits  LABORATORY DATA:  I have reviewed the data as listed Lab Results  Component Value Date   WBC 9.1 12/29/2013   HGB 12.9 12/29/2013   HCT 40.1 12/29/2013   MCV 82.3 12/29/2013   PLT 225 12/29/2013    Recent Labs  08/13/13 0327 08/15/13 0515  09/09/13 0940  10/18/13 1322 11/15/13 0901 12/13/13 0948  NA 136 136  < > 138  < > 140 139 142  K 3.8 4.1  < > 4.1  < > 3.8 4.2 4.2  CL 101 101  --  105  --   --   --   --   CO2 25 26  < >  24  < > _0 GLUCOSE 148* 123*  < > 88  < > 108 100 97  BUN 10 12  < > 15  < > 10.7 10.6 11.3  CREATININE 0.58 0.59  < > 0.71  < > 0.8 0.7 0.8  CALCIUM 9.2 9.2  < > 9.0  < > 9.0 9.2 9.4  GFRNONAA >90 >90  --  >90  --   --   --   --   GFRAA >90 >90  --  >90  --   --   --   --   PROT  --  6.7  < >  --   < > 6.5 6.7 6.5  ALBUMIN  --  3.0*  < >  --   < > 3.6 3.8 3.7  AST  --  8  < >  --   < > _1 ALT  --  16  < >  --   < > _2 ALKPHOS  --  85  < >  --   < > 74 75 80  BILITOT  --  0.1*  < >  --   < > 0.21 0.25 0.21  < > = values in this interval not displayed.  ASSESSMENT & PLAN:  #1 Hodgkin lymphoma I would proceed with treatment with a dose adjustment today. #2 history of neutropenia Per patient request, we will try to move her appointments the Mondays, which would delay her next cycle by 5 days. I think that would allow adequate time for bone marrow recovery and hence I do not think she would need Neulasta for next cycle of treatment. #3 history of severe nausea and vomiting We will continue current anti-emetic regimens. Orders Placed This Encounter  Procedures  . CBC with Differential    Standing Status: Standing     Number of Occurrences: 9     Standing Expiration Date:  12/30/2014  . Comprehensive metabolic panel    Standing Status: Standing     Number of Occurrences: 9     Standing Expiration Date: 12/30/2014  . Lactate dehydrogenase    Standing Status: Standing     Number of Occurrences: 9     Standing Expiration Date: 12/30/2014    All questions were answered. The patient knows to call the clinic with any problems, questions or concerns. I spent 25 minutes counseling the patient face to face. The total time spent in the appointment was 30 minutes and more than 50% was on counseling.     Heath Lark, MD 12/29/2013 8:16 PM

## 2013-12-30 ENCOUNTER — Telehealth: Payer: Self-pay | Admitting: Hematology and Oncology

## 2013-12-30 NOTE — Telephone Encounter (Signed)
sw. pt and advised on April appt....ok and aware pt will pick up new sched

## 2014-01-10 ENCOUNTER — Other Ambulatory Visit: Payer: 59

## 2014-01-10 ENCOUNTER — Ambulatory Visit: Payer: 59

## 2014-01-11 ENCOUNTER — Ambulatory Visit: Payer: 59

## 2014-01-17 ENCOUNTER — Other Ambulatory Visit: Payer: Self-pay | Admitting: Hematology and Oncology

## 2014-01-17 ENCOUNTER — Ambulatory Visit (HOSPITAL_BASED_OUTPATIENT_CLINIC_OR_DEPARTMENT_OTHER): Payer: 59

## 2014-01-17 ENCOUNTER — Other Ambulatory Visit (HOSPITAL_BASED_OUTPATIENT_CLINIC_OR_DEPARTMENT_OTHER): Payer: 59

## 2014-01-17 ENCOUNTER — Telehealth: Payer: Self-pay | Admitting: *Deleted

## 2014-01-17 VITALS — BP 139/75 | HR 81 | Temp 98.6°F | Resp 18

## 2014-01-17 DIAGNOSIS — C8110 Nodular sclerosis classical Hodgkin lymphoma, unspecified site: Secondary | ICD-10-CM

## 2014-01-17 DIAGNOSIS — R590 Localized enlarged lymph nodes: Secondary | ICD-10-CM

## 2014-01-17 DIAGNOSIS — C8111 Nodular sclerosis classical Hodgkin lymphoma, lymph nodes of head, face, and neck: Secondary | ICD-10-CM

## 2014-01-17 DIAGNOSIS — Z5111 Encounter for antineoplastic chemotherapy: Secondary | ICD-10-CM

## 2014-01-17 LAB — CBC WITH DIFFERENTIAL/PLATELET
BASO%: 2.1 % — ABNORMAL HIGH (ref 0.0–2.0)
Basophils Absolute: 0 10*3/uL (ref 0.0–0.1)
EOS%: 1.6 % (ref 0.0–7.0)
Eosinophils Absolute: 0 10*3/uL (ref 0.0–0.5)
HEMATOCRIT: 38.5 % (ref 34.8–46.6)
HGB: 12.3 g/dL (ref 11.6–15.9)
LYMPH#: 0.8 10*3/uL — AB (ref 0.9–3.3)
LYMPH%: 40.4 % (ref 14.0–49.7)
MCH: 26.1 pg (ref 25.1–34.0)
MCHC: 31.9 g/dL (ref 31.5–36.0)
MCV: 81.7 fL (ref 79.5–101.0)
MONO#: 0.3 10*3/uL (ref 0.1–0.9)
MONO%: 14.4 % — ABNORMAL HIGH (ref 0.0–14.0)
NEUT#: 0.8 10*3/uL — ABNORMAL LOW (ref 1.5–6.5)
NEUT%: 41.5 % (ref 38.4–76.8)
Platelets: 323 10*3/uL (ref 145–400)
RBC: 4.71 10*6/uL (ref 3.70–5.45)
RDW: 15 % — ABNORMAL HIGH (ref 11.2–14.5)
WBC: 1.9 10*3/uL — ABNORMAL LOW (ref 3.9–10.3)
nRBC: 0 % (ref 0–0)

## 2014-01-17 LAB — COMPREHENSIVE METABOLIC PANEL (CC13)
ALBUMIN: 3.7 g/dL (ref 3.5–5.0)
ALK PHOS: 86 U/L (ref 40–150)
ALT: 25 U/L (ref 0–55)
ANION GAP: 9 meq/L (ref 3–11)
AST: 20 U/L (ref 5–34)
BUN: 11.1 mg/dL (ref 7.0–26.0)
CALCIUM: 9.6 mg/dL (ref 8.4–10.4)
CHLORIDE: 107 meq/L (ref 98–109)
CO2: 25 mEq/L (ref 22–29)
Creatinine: 0.8 mg/dL (ref 0.6–1.1)
GLUCOSE: 88 mg/dL (ref 70–140)
POTASSIUM: 3.7 meq/L (ref 3.5–5.1)
Sodium: 141 mEq/L (ref 136–145)
Total Bilirubin: 0.31 mg/dL (ref 0.20–1.20)
Total Protein: 6.7 g/dL (ref 6.4–8.3)

## 2014-01-17 LAB — LACTATE DEHYDROGENASE (CC13): LDH: 203 U/L (ref 125–245)

## 2014-01-17 MED ORDER — PALONOSETRON HCL INJECTION 0.25 MG/5ML
0.2500 mg | Freq: Once | INTRAVENOUS | Status: AC
  Administered 2014-01-17: 0.25 mg via INTRAVENOUS

## 2014-01-17 MED ORDER — PALONOSETRON HCL INJECTION 0.25 MG/5ML
INTRAVENOUS | Status: AC
  Filled 2014-01-17: qty 5

## 2014-01-17 MED ORDER — SODIUM CHLORIDE 0.9 % IV SOLN
375.0000 mg/m2 | Freq: Once | INTRAVENOUS | Status: AC
  Administered 2014-01-17: 800 mg via INTRAVENOUS
  Filled 2014-01-17: qty 40

## 2014-01-17 MED ORDER — DEXAMETHASONE SODIUM PHOSPHATE 20 MG/5ML IJ SOLN
12.0000 mg | Freq: Once | INTRAMUSCULAR | Status: AC
  Administered 2014-01-17: 12 mg via INTRAVENOUS

## 2014-01-17 MED ORDER — VINBLASTINE SULFATE CHEMO INJECTION 1 MG/ML
6.0000 mg/m2 | Freq: Once | INTRAVENOUS | Status: AC
  Administered 2014-01-17: 13 mg via INTRAVENOUS
  Filled 2014-01-17: qty 13

## 2014-01-17 MED ORDER — SODIUM CHLORIDE 0.9 % IV SOLN
150.0000 mg | Freq: Once | INTRAVENOUS | Status: AC
  Administered 2014-01-17: 150 mg via INTRAVENOUS
  Filled 2014-01-17: qty 5

## 2014-01-17 MED ORDER — PROMETHAZINE HCL 25 MG/ML IJ SOLN
25.0000 mg | Freq: Once | INTRAMUSCULAR | Status: AC
  Administered 2014-01-17: 25 mg via INTRAVENOUS
  Filled 2014-01-17: qty 1

## 2014-01-17 MED ORDER — DEXAMETHASONE SODIUM PHOSPHATE 20 MG/5ML IJ SOLN
INTRAMUSCULAR | Status: AC
  Filled 2014-01-17: qty 5

## 2014-01-17 MED ORDER — SODIUM CHLORIDE 0.9 % IV SOLN
10.0000 [IU]/m2 | Freq: Once | INTRAVENOUS | Status: AC
  Administered 2014-01-17: 22 [IU] via INTRAVENOUS
  Filled 2014-01-17: qty 7.33

## 2014-01-17 MED ORDER — SODIUM CHLORIDE 0.9 % IJ SOLN
10.0000 mL | INTRAMUSCULAR | Status: DC | PRN
  Administered 2014-01-17: 10 mL
  Filled 2014-01-17: qty 10

## 2014-01-17 MED ORDER — HEPARIN SOD (PORK) LOCK FLUSH 100 UNIT/ML IV SOLN
500.0000 [IU] | Freq: Once | INTRAVENOUS | Status: AC | PRN
  Administered 2014-01-17: 500 [IU]
  Filled 2014-01-17: qty 5

## 2014-01-17 MED ORDER — SODIUM CHLORIDE 0.9 % IV SOLN
Freq: Once | INTRAVENOUS | Status: AC
  Administered 2014-01-17: 12:00:00 via INTRAVENOUS

## 2014-01-17 MED ORDER — DOXORUBICIN HCL CHEMO IV INJECTION 2 MG/ML
25.0000 mg/m2 | Freq: Once | INTRAVENOUS | Status: AC
  Administered 2014-01-17: 56 mg via INTRAVENOUS
  Filled 2014-01-17: qty 28

## 2014-01-17 NOTE — Telephone Encounter (Signed)
done

## 2014-01-17 NOTE — Telephone Encounter (Signed)
Father asks if pt needs Neulasta tomorrow after chemo today?  Pt is not scheduled for Neulasta.  He is worried if she does not get it then her next chemo will be delayed due to low counts.  He says pt also needs a letter she can return back to work the 1st of June.

## 2014-01-17 NOTE — Telephone Encounter (Signed)
Neulasta scheduled for tomorrow per Dr. Alvy Bimler.  Letter to return to work 4 weeks after last chemo signed by Dr. Alvy Bimler and given to pt's father in infusion.  He is aware of Neulasta for tomorrow.

## 2014-01-17 NOTE — Progress Notes (Signed)
OK to treat with todays lab values per Dr. Alvy Bimler.

## 2014-01-17 NOTE — Patient Instructions (Signed)
[facility] Discharge Instructions for Patients Receiving Chemotherapy  Today you received the following chemotherapy agents: adriamycin, vinblastine, bleomycin, dacarbazine  To help prevent nausea and vomiting after your treatment, we encourage you to take your nausea medication.  Take it as often as prescribed.     If you develop nausea and vomiting that is not controlled by your nausea medication, call the clinic. If it is after clinic hours your family physician or the after hours number for the clinic or go to the Emergency Department.   BELOW ARE SYMPTOMS THAT SHOULD BE REPORTED IMMEDIATELY:  *FEVER GREATER THAN 100.5 F  *CHILLS WITH OR WITHOUT FEVER  NAUSEA AND VOMITING THAT IS NOT CONTROLLED WITH YOUR NAUSEA MEDICATION  *UNUSUAL SHORTNESS OF BREATH  *UNUSUAL BRUISING OR BLEEDING  TENDERNESS IN MOUTH AND THROAT WITH OR WITHOUT PRESENCE OF ULCERS  *URINARY PROBLEMS  *BOWEL PROBLEMS  UNUSUAL RASH Items with * indicate a potential emergency and should be followed up as soon as possible.  Feel free to call the clinic you have any questions or concerns. The clinic phone number is [phone].   I have been informed and understand all the instructions given to me. I know to contact the clinic, my physician, or go to the Emergency Department if any problems should occur. I do not have any questions at this time, but understand that I may call the clinic during office hours   should I have any questions or need assistance in obtaining follow up care.    __________________________________________  _____________  __________ Signature of Patient or Authorized Representative            Date                   Time    __________________________________________ Nurse's Signature    

## 2014-01-18 ENCOUNTER — Ambulatory Visit (HOSPITAL_BASED_OUTPATIENT_CLINIC_OR_DEPARTMENT_OTHER): Payer: 59

## 2014-01-18 VITALS — BP 134/66 | HR 101 | Temp 98.1°F

## 2014-01-18 DIAGNOSIS — C8111 Nodular sclerosis classical Hodgkin lymphoma, lymph nodes of head, face, and neck: Secondary | ICD-10-CM

## 2014-01-18 DIAGNOSIS — Z5189 Encounter for other specified aftercare: Secondary | ICD-10-CM

## 2014-01-18 DIAGNOSIS — C8110 Nodular sclerosis classical Hodgkin lymphoma, unspecified site: Secondary | ICD-10-CM

## 2014-01-18 MED ORDER — PEGFILGRASTIM INJECTION 6 MG/0.6ML
6.0000 mg | Freq: Once | SUBCUTANEOUS | Status: AC
Start: 2014-01-18 — End: 2014-01-18
  Administered 2014-01-18: 6 mg via SUBCUTANEOUS
  Filled 2014-01-18: qty 0.6

## 2014-01-24 ENCOUNTER — Ambulatory Visit: Payer: 59

## 2014-01-24 ENCOUNTER — Other Ambulatory Visit: Payer: 59

## 2014-01-31 ENCOUNTER — Other Ambulatory Visit (HOSPITAL_BASED_OUTPATIENT_CLINIC_OR_DEPARTMENT_OTHER): Payer: 59

## 2014-01-31 ENCOUNTER — Ambulatory Visit (HOSPITAL_BASED_OUTPATIENT_CLINIC_OR_DEPARTMENT_OTHER): Payer: 59 | Admitting: Hematology and Oncology

## 2014-01-31 ENCOUNTER — Encounter: Payer: Self-pay | Admitting: Hematology and Oncology

## 2014-01-31 ENCOUNTER — Ambulatory Visit (HOSPITAL_BASED_OUTPATIENT_CLINIC_OR_DEPARTMENT_OTHER): Payer: 59

## 2014-01-31 ENCOUNTER — Telehealth: Payer: Self-pay | Admitting: Hematology and Oncology

## 2014-01-31 VITALS — BP 127/74 | HR 88 | Temp 97.7°F | Resp 18 | Ht 69.0 in | Wt 253.0 lb

## 2014-01-31 DIAGNOSIS — C8119 Nodular sclerosis classical Hodgkin lymphoma, extranodal and solid organ sites: Secondary | ICD-10-CM

## 2014-01-31 DIAGNOSIS — Z5111 Encounter for antineoplastic chemotherapy: Secondary | ICD-10-CM

## 2014-01-31 DIAGNOSIS — R590 Localized enlarged lymph nodes: Secondary | ICD-10-CM

## 2014-01-31 DIAGNOSIS — R5381 Other malaise: Secondary | ICD-10-CM

## 2014-01-31 DIAGNOSIS — C8110 Nodular sclerosis classical Hodgkin lymphoma, unspecified site: Secondary | ICD-10-CM

## 2014-01-31 DIAGNOSIS — R5383 Other fatigue: Secondary | ICD-10-CM

## 2014-01-31 LAB — COMPREHENSIVE METABOLIC PANEL (CC13)
ALBUMIN: 3.7 g/dL (ref 3.5–5.0)
ALT: 18 U/L (ref 0–55)
AST: 14 U/L (ref 5–34)
Alkaline Phosphatase: 95 U/L (ref 40–150)
Anion Gap: 10 mEq/L (ref 3–11)
BUN: 9.3 mg/dL (ref 7.0–26.0)
CO2: 25 mEq/L (ref 22–29)
Calcium: 9.5 mg/dL (ref 8.4–10.4)
Chloride: 106 mEq/L (ref 98–109)
Creatinine: 0.8 mg/dL (ref 0.6–1.1)
Glucose: 114 mg/dl (ref 70–140)
POTASSIUM: 4 meq/L (ref 3.5–5.1)
SODIUM: 141 meq/L (ref 136–145)
Total Bilirubin: 0.26 mg/dL (ref 0.20–1.20)
Total Protein: 6.6 g/dL (ref 6.4–8.3)

## 2014-01-31 LAB — CBC WITH DIFFERENTIAL/PLATELET
BASO%: 0.6 % (ref 0.0–2.0)
Basophils Absolute: 0 10*3/uL (ref 0.0–0.1)
EOS%: 0.6 % (ref 0.0–7.0)
Eosinophils Absolute: 0 10*3/uL (ref 0.0–0.5)
HEMATOCRIT: 37.9 % (ref 34.8–46.6)
HGB: 12.3 g/dL (ref 11.6–15.9)
LYMPH#: 1 10*3/uL (ref 0.9–3.3)
LYMPH%: 12.9 % — ABNORMAL LOW (ref 14.0–49.7)
MCH: 26.4 pg (ref 25.1–34.0)
MCHC: 32.4 g/dL (ref 31.5–36.0)
MCV: 81.4 fL (ref 79.5–101.0)
MONO#: 0.6 10*3/uL (ref 0.1–0.9)
MONO%: 7.7 % (ref 0.0–14.0)
NEUT%: 78.2 % — ABNORMAL HIGH (ref 38.4–76.8)
NEUTROS ABS: 6.1 10*3/uL (ref 1.5–6.5)
Platelets: 269 10*3/uL (ref 145–400)
RBC: 4.65 10*6/uL (ref 3.70–5.45)
RDW: 17.4 % — ABNORMAL HIGH (ref 11.2–14.5)
WBC: 7.8 10*3/uL (ref 3.9–10.3)

## 2014-01-31 LAB — LACTATE DEHYDROGENASE (CC13): LDH: 221 U/L (ref 125–245)

## 2014-01-31 MED ORDER — PALONOSETRON HCL INJECTION 0.25 MG/5ML
0.2500 mg | Freq: Once | INTRAVENOUS | Status: AC
  Administered 2014-01-31: 0.25 mg via INTRAVENOUS

## 2014-01-31 MED ORDER — SODIUM CHLORIDE 0.9 % IV SOLN
Freq: Once | INTRAVENOUS | Status: AC
  Administered 2014-01-31: 10:00:00 via INTRAVENOUS

## 2014-01-31 MED ORDER — SODIUM CHLORIDE 0.9 % IJ SOLN
10.0000 mL | INTRAMUSCULAR | Status: DC | PRN
  Administered 2014-01-31: 10 mL
  Filled 2014-01-31: qty 10

## 2014-01-31 MED ORDER — SODIUM CHLORIDE 0.9 % IV SOLN
150.0000 mg | Freq: Once | INTRAVENOUS | Status: AC
  Administered 2014-01-31: 150 mg via INTRAVENOUS
  Filled 2014-01-31: qty 5

## 2014-01-31 MED ORDER — VINBLASTINE SULFATE CHEMO INJECTION 1 MG/ML
6.0000 mg/m2 | Freq: Once | INTRAVENOUS | Status: AC
  Administered 2014-01-31: 13 mg via INTRAVENOUS
  Filled 2014-01-31: qty 13

## 2014-01-31 MED ORDER — HEPARIN SOD (PORK) LOCK FLUSH 100 UNIT/ML IV SOLN
500.0000 [IU] | Freq: Once | INTRAVENOUS | Status: AC | PRN
  Administered 2014-01-31: 500 [IU]
  Filled 2014-01-31: qty 5

## 2014-01-31 MED ORDER — PALONOSETRON HCL INJECTION 0.25 MG/5ML
INTRAVENOUS | Status: AC
  Filled 2014-01-31: qty 5

## 2014-01-31 MED ORDER — SODIUM CHLORIDE 0.9 % IV SOLN
10.0000 [IU]/m2 | Freq: Once | INTRAVENOUS | Status: AC
  Administered 2014-01-31: 22 [IU] via INTRAVENOUS
  Filled 2014-01-31: qty 7.33

## 2014-01-31 MED ORDER — DEXAMETHASONE SODIUM PHOSPHATE 10 MG/ML IJ SOLN
INTRAMUSCULAR | Status: AC
  Filled 2014-01-31: qty 1

## 2014-01-31 MED ORDER — DEXAMETHASONE SODIUM PHOSPHATE 20 MG/5ML IJ SOLN
12.0000 mg | Freq: Once | INTRAMUSCULAR | Status: AC
  Administered 2014-01-31: 12 mg via INTRAVENOUS

## 2014-01-31 MED ORDER — SODIUM CHLORIDE 0.9 % IV SOLN
375.0000 mg/m2 | Freq: Once | INTRAVENOUS | Status: AC
  Administered 2014-01-31: 820 mg via INTRAVENOUS
  Filled 2014-01-31: qty 41

## 2014-01-31 MED ORDER — DEXAMETHASONE SODIUM PHOSPHATE 20 MG/5ML IJ SOLN
INTRAMUSCULAR | Status: AC
  Filled 2014-01-31: qty 5

## 2014-01-31 MED ORDER — DOXORUBICIN HCL CHEMO IV INJECTION 2 MG/ML
25.0000 mg/m2 | Freq: Once | INTRAVENOUS | Status: AC
  Administered 2014-01-31: 56 mg via INTRAVENOUS
  Filled 2014-01-31: qty 28

## 2014-01-31 MED ORDER — SODIUM CHLORIDE 0.9 % IV SOLN: 375.0000 mg/m2 | Freq: Once | INTRAVENOUS | Status: DC

## 2014-01-31 NOTE — Telephone Encounter (Signed)
gv pt appt schedule for june. central will call w/pet - pt aware.

## 2014-01-31 NOTE — Progress Notes (Signed)
[facility] OFFICE PROGRESS NOTE  Patient Care Team: Berkley Harvey, NP as PCP - General (Nurse Practitioner) Izora Gala, MD as Consulting Physician (Otolaryngology) Melrose Nakayama, MD as Consulting Physician (Cardiothoracic Surgery)  DIAGNOSIS: Hodgkin's lymphoma, seen prior to her final treatment  SUMMARY OF ONCOLOGIC HISTORY: Oncology History   Hodgkin's lymphoma, nodular sclerosis   Primary site: Lymphoid Neoplasms   Staging method: AJCC 6th Edition   Clinical: Stage III signed by Heath Lark, MD on 12/29/2013  8:11 PM   Pathologic: Stage III signed by Heath Lark, MD on 12/29/2013  8:11 PM   Summary: Stage III       Hodgkin's lymphoma, nodular sclerosis   07/30/2013 Imaging CT scans show disease on both sides of the diaphragm with splenic involvement.   08/09/2013 Surgery Mediastinoscopy and biopsy confirmed classical Hodgkin lymphoma   08/14/2013 -  Chemotherapy Patient was started on treatment with ABVD   11/22/2013 Imaging Repeat staging CT scans showed marked response to treatment.    INTERVAL HISTORY: Miranda Villegas 23 y.o. female returns for further followup. She had severe nausea with the last treatment, resolved with antibiotics. She has some mild fatigue. Denies any new issues. She denies any recent fever, chills, night sweats or abnormal weight loss No recent infection. Denies any recent excessive bleeding. She has mild ankle edema after chemotherapy, resolved spontaneously.   I have reviewed the past medical history, past surgical history, social history and family history with the patient and they are unchanged from previous note.  ALLERGIES:  is allergic to adhesive.  MEDICATIONS:  Current Outpatient Prescriptions  Medication Sig Dispense Refill  . ibuprofen (ADVIL,MOTRIN) 200 MG tablet Take 400-600 mg by mouth every 6 (six) hours as needed for fever, headache, mild pain, moderate pain or cramping.       . lidocaine-prilocaine (EMLA) cream  Apply 1 application topically as needed. Apply to portacath site 1-2 hours prior to use.  30 g  3  . loratadine (CLARITIN) 10 MG tablet Take 10 mg by mouth daily.       Marland Kitchen LORazepam (ATIVAN) 1 MG tablet Take 1 tablet (1 mg total) by mouth every 6 (six) hours as needed for anxiety or sleep.  30 tablet  0  . ondansetron (ZOFRAN ODT) 8 MG disintegrating tablet Take 1 tablet (8 mg total) by mouth every 8 (eight) hours as needed for nausea or vomiting (Take every 8 hours for the next 48 hours, then as needed.).  30 tablet  3  . oxycodone (OXY-IR) 5 MG capsule Take 1 capsule (5 mg total) by mouth every 4 (four) hours as needed.  30 capsule  0  . polyethylene glycol (MIRALAX / GLYCOLAX) packet Take 17 g by mouth 2 (two) times daily.      Marland Kitchen triamcinolone cream (KENALOG) 0.1 % Apply 1 application topically daily as needed (for eczema flare ups).       No current facility-administered medications for this visit.   Facility-Administered Medications Ordered in Other Visits  Medication Dose Route Frequency Provider Last Rate Last Dose  . sodium chloride 0.9 % injection 10 mL  10 mL Intracatheter PRN Heath Lark, MD   10 mL at 01/31/14 1241    REVIEW OF SYSTEMS:   Constitutional: Denies fevers, chills or abnormal weight loss Eyes: Denies blurriness of vision Ears, nose, mouth, throat, and face: Denies mucositis or sore throat Respiratory: Denies cough, dyspnea or wheezes Cardiovascular: Denies palpitation, chest discomfort Skin: Denies abnormal skin rashes Lymphatics: Denies  new lymphadenopathy or easy bruising Neurological:Denies numbness, tingling or new weaknesses Behavioral/Psych: Mood is stable, no new changes  All other systems were reviewed with the patient and are negative.  PHYSICAL EXAMINATION: ECOG PERFORMANCE STATUS: 1 - Symptomatic but completely ambulatory  Filed Vitals:   01/31/14 0859  BP: 127/74  Pulse: 88  Temp: 97.7 F (36.5 C)  Resp: 18   Filed Weights   01/31/14 0859   Weight: 253 lb (114.76 kg)    GENERAL:alert, no distress and comfortable. She is morbidly obese SKIN: skin color, texture, turgor are normal, no rashes or significant lesions EYES: normal, Conjunctiva are pink and non-injected, sclera clear OROPHARYNX:no exudate, no erythema and lips, buccal mucosa, and tongue normal  NECK: supple, thyroid normal size, non-tender, without nodularity LYMPH:  no palpable lymphadenopathy in the cervical, axillary or inguinal LUNGS: clear to auscultation and percussion with normal breathing effort HEART: regular rate & rhythm and no murmurs with mild bilateral lower extremity edema ABDOMEN:abdomen soft, non-tender and normal bowel sounds Musculoskeletal:no cyanosis of digits and no clubbing  NEURO: alert & oriented x 3 with fluent speech, no focal motor/sensory deficits  LABORATORY DATA:  I have reviewed the data as listed    Component Value Date/Time   NA 141 01/31/2014 0843   NA 138 09/09/2013 0940   K 4.0 01/31/2014 0843   K 4.1 09/09/2013 0940   CL 105 09/09/2013 0940   CO2 25 01/31/2014 0843   CO2 24 09/09/2013 0940   GLUCOSE 114 01/31/2014 0843   GLUCOSE 88 09/09/2013 0940   BUN 9.3 01/31/2014 0843   BUN 15 09/09/2013 0940   CREATININE 0.8 01/31/2014 0843   CREATININE 0.71 09/09/2013 0940   CALCIUM 9.5 01/31/2014 0843   CALCIUM 9.0 09/09/2013 0940   PROT 6.6 01/31/2014 0843   PROT 6.7 08/15/2013 0515   ALBUMIN 3.7 01/31/2014 0843   ALBUMIN 3.0* 08/15/2013 0515   AST 14 01/31/2014 0843   AST 8 08/15/2013 0515   ALT 18 01/31/2014 0843   ALT 16 08/15/2013 0515   ALKPHOS 95 01/31/2014 0843   ALKPHOS 85 08/15/2013 0515   BILITOT 0.26 01/31/2014 0843   BILITOT 0.1* 08/15/2013 0515   GFRNONAA >90 09/09/2013 0940   GFRAA >90 09/09/2013 0940    No results found for this basename: SPEP,  UPEP,   kappa and lambda light chains    Lab Results  Component Value Date   WBC 7.8 01/31/2014   NEUTROABS 6.1 01/31/2014   HGB 12.3 01/31/2014   HCT 37.9  01/31/2014   MCV 81.4 01/31/2014   PLT 269 01/31/2014      Chemistry      Component Value Date/Time   NA 141 01/31/2014 0843   NA 138 09/09/2013 0940   K 4.0 01/31/2014 0843   K 4.1 09/09/2013 0940   CL 105 09/09/2013 0940   CO2 25 01/31/2014 0843   CO2 24 09/09/2013 0940   BUN 9.3 01/31/2014 0843   BUN 15 09/09/2013 0940   CREATININE 0.8 01/31/2014 0843   CREATININE 0.71 09/09/2013 0940      Component Value Date/Time   CALCIUM 9.5 01/31/2014 0843   CALCIUM 9.0 09/09/2013 0940   ALKPHOS 95 01/31/2014 0843   ALKPHOS 85 08/15/2013 0515   AST 14 01/31/2014 0843   AST 8 08/15/2013 0515   ALT 18 01/31/2014 0843   ALT 16 08/15/2013 0515   BILITOT 0.26 01/31/2014 0843   BILITOT 0.1* 08/15/2013 0515     ASSESSMENT & PLAN:  #  1 Hodgkin lymphoma I would proceed with treatment today. Since this would be her last treatment, there is no indication to give her Neulasta. I will order a PET CT scan to be done about a month from now. If her PET CT scan is negative, we will get her port removed. We will discuss about the role of bone marrow aspirate and biopsy in the future. #2 history of severe nausea and vomiting We will continue current anti-emetic regimens. #3 disability She request a new letter to address her disability and when she can return back to work. Orders Placed This Encounter  Procedures  . NM PET Image Restag (PS) Skull Base To Thigh    Standing Status: Future     Number of Occurrences:      Standing Expiration Date: 04/02/2015    Order Specific Question:  Reason for Exam (SYMPTOM  OR DIAGNOSIS REQUIRED)    Answer:  staging hodgkin's lymphoma, restage    Order Specific Question:  Is the patient pregnant?    Answer:  No    Order Specific Question:  Preferred imaging location?    Answer:  [facility]   All questions were answered. The patient knows to call the clinic with any problems, questions or concerns. No barriers to learning was detected. I spent 30 minutes  counseling the patient face to face. The total time spent in the appointment was 40 minutes and more than 50% was on counseling and review of test results     Heath Lark, MD 01/31/2014 1:05 PM

## 2014-01-31 NOTE — Patient Instructions (Signed)
[facility] Discharge Instructions for Patients Receiving Chemotherapy  Today you received the following chemotherapy agents AVBD. LAST DAY !! :)  To help prevent nausea and vomiting after your treatment, we encourage you to take your nausea medication as needed.   If you develop nausea and vomiting that is not controlled by your nausea medication, call the clinic.   BELOW ARE SYMPTOMS THAT SHOULD BE REPORTED IMMEDIATELY:  *FEVER GREATER THAN 100.5 F  *CHILLS WITH OR WITHOUT FEVER  NAUSEA AND VOMITING THAT IS NOT CONTROLLED WITH YOUR NAUSEA MEDICATION  *UNUSUAL SHORTNESS OF BREATH  *UNUSUAL BRUISING OR BLEEDING  TENDERNESS IN MOUTH AND THROAT WITH OR WITHOUT PRESENCE OF ULCERS  *URINARY PROBLEMS  *BOWEL PROBLEMS  UNUSUAL RASH Items with * indicate a potential emergency and should be followed up as soon as possible.  Feel free to call the clinic you have any questions or concerns. The clinic phone number is [phone].

## 2014-02-15 ENCOUNTER — Ambulatory Visit: Payer: 59

## 2014-02-15 ENCOUNTER — Other Ambulatory Visit: Payer: 59

## 2014-02-15 ENCOUNTER — Telehealth: Payer: Self-pay | Admitting: *Deleted

## 2014-02-15 NOTE — Telephone Encounter (Signed)
yes

## 2014-02-15 NOTE — Telephone Encounter (Signed)
Left VM informing ok for pt to have Pedicure per Dr. Alvy Bimler.  Call us back if any questions.

## 2014-02-15 NOTE — Telephone Encounter (Signed)
Father asking if ok w/ Dr. Alvy Bimler for pt to get Pedicure? Her last chemo was on 5/11.

## 2014-03-01 ENCOUNTER — Ambulatory Visit (HOSPITAL_COMMUNITY)
Admission: RE | Admit: 2014-03-01 | Discharge: 2014-03-01 | Disposition: A | Discharge status: Home / Self Care | Payer: 59 | Source: Ambulatory Visit | Attending: Hematology and Oncology | Admitting: Hematology and Oncology

## 2014-03-01 ENCOUNTER — Telehealth: Payer: Self-pay | Admitting: Hematology and Oncology

## 2014-03-01 ENCOUNTER — Encounter: Payer: Self-pay | Admitting: Hematology and Oncology

## 2014-03-01 ENCOUNTER — Telehealth: Payer: Self-pay | Admitting: *Deleted

## 2014-03-01 ENCOUNTER — Other Ambulatory Visit: Payer: Self-pay | Admitting: Hematology and Oncology

## 2014-03-01 DIAGNOSIS — C8110 Nodular sclerosis classical Hodgkin lymphoma, unspecified site: Secondary | ICD-10-CM

## 2014-03-01 DIAGNOSIS — C8190 Hodgkin lymphoma, unspecified, unspecified site: Secondary | ICD-10-CM | POA: Insufficient documentation

## 2014-03-01 LAB — GLUCOSE, CAPILLARY: GLUCOSE-CAPILLARY: 90 mg/dL (ref 70–99)

## 2014-03-01 MED ORDER — FLUDEOXYGLUCOSE F - 18 (FDG) INJECTION
13.4000 | Freq: Once | INTRAVENOUS | Status: AC | PRN
  Administered 2014-03-01: 13.4 via INTRAVENOUS

## 2014-03-01 NOTE — Telephone Encounter (Signed)
I reviewed the preliminary PET/CT scan with the patient's father. He requested I extend her disability for another month due to profound fatigue. PET/CT scan showed no hypermetabolic activity within the chest although persistent lymphadenopathy is seen. I recommend moving her appointment from Thursday to next week and I plan to present her case at the next hematology tumor board. We'll shall discuss about the role of possible radiation treatment.

## 2014-03-01 NOTE — Telephone Encounter (Signed)
Father states pt just completed PET this morning and were told results would be available this afternoon.  He asks if Dr. Alvy Bimler will call him w/ results today?  He knows they have appt to see her on Thursday, but are anxious for results.

## 2014-03-02 ENCOUNTER — Telehealth: Payer: Self-pay | Admitting: Hematology and Oncology

## 2014-03-02 ENCOUNTER — Telehealth: Payer: Self-pay | Admitting: *Deleted

## 2014-03-02 NOTE — Telephone Encounter (Signed)
Letter revised to state return to work after June 30th.  Faxed to pt's father at his work fax [phone].

## 2014-03-02 NOTE — Telephone Encounter (Signed)
, °

## 2014-03-02 NOTE — Telephone Encounter (Signed)
Informed father about letter for pt states she cannot return to work until July 31 st.  He asks if it can be revised to state she can return after June 30th.  He asks for letter to be faxed to his work at fax [phone].

## 2014-03-03 ENCOUNTER — Other Ambulatory Visit: Payer: 59

## 2014-03-03 ENCOUNTER — Ambulatory Visit: Payer: 59 | Admitting: Hematology and Oncology

## 2014-03-08 ENCOUNTER — Ambulatory Visit (HOSPITAL_BASED_OUTPATIENT_CLINIC_OR_DEPARTMENT_OTHER): Payer: 59 | Admitting: Hematology and Oncology

## 2014-03-08 ENCOUNTER — Other Ambulatory Visit (HOSPITAL_BASED_OUTPATIENT_CLINIC_OR_DEPARTMENT_OTHER): Payer: 59

## 2014-03-08 ENCOUNTER — Encounter: Payer: Self-pay | Admitting: Hematology and Oncology

## 2014-03-08 VITALS — BP 136/71 | HR 96 | Temp 99.4°F | Resp 18 | Ht 69.0 in | Wt 260.7 lb

## 2014-03-08 DIAGNOSIS — C8119 Nodular sclerosis classical Hodgkin lymphoma, extranodal and solid organ sites: Secondary | ICD-10-CM

## 2014-03-08 DIAGNOSIS — C8110 Nodular sclerosis classical Hodgkin lymphoma, unspecified site: Secondary | ICD-10-CM

## 2014-03-08 DIAGNOSIS — R5381 Other malaise: Secondary | ICD-10-CM | POA: Insufficient documentation

## 2014-03-08 DIAGNOSIS — Z95828 Presence of other vascular implants and grafts: Secondary | ICD-10-CM

## 2014-03-08 LAB — COMPREHENSIVE METABOLIC PANEL (CC13)
ALBUMIN: 3.6 g/dL (ref 3.5–5.0)
ALT: 34 U/L (ref 0–55)
AST: 22 U/L (ref 5–34)
Alkaline Phosphatase: 79 U/L (ref 40–150)
Anion Gap: 7 mEq/L (ref 3–11)
BUN: 9.2 mg/dL (ref 7.0–26.0)
CALCIUM: 8.9 mg/dL (ref 8.4–10.4)
CHLORIDE: 107 meq/L (ref 98–109)
CO2: 25 mEq/L (ref 22–29)
Creatinine: 0.8 mg/dL (ref 0.6–1.1)
Glucose: 90 mg/dl (ref 70–140)
POTASSIUM: 4 meq/L (ref 3.5–5.1)
SODIUM: 140 meq/L (ref 136–145)
Total Bilirubin: 0.28 mg/dL (ref 0.20–1.20)
Total Protein: 6.5 g/dL (ref 6.4–8.3)

## 2014-03-08 LAB — CBC WITH DIFFERENTIAL/PLATELET
BASO%: 1 % (ref 0.0–2.0)
Basophils Absolute: 0 10*3/uL (ref 0.0–0.1)
EOS%: 0.8 % (ref 0.0–7.0)
Eosinophils Absolute: 0 10*3/uL (ref 0.0–0.5)
HCT: 38 % (ref 34.8–46.6)
HEMOGLOBIN: 12 g/dL (ref 11.6–15.9)
LYMPH#: 0.8 10*3/uL — AB (ref 0.9–3.3)
LYMPH%: 18.3 % (ref 14.0–49.7)
MCH: 25.9 pg (ref 25.1–34.0)
MCHC: 31.7 g/dL (ref 31.5–36.0)
MCV: 81.9 fL (ref 79.5–101.0)
MONO#: 0.4 10*3/uL (ref 0.1–0.9)
MONO%: 8.3 % (ref 0.0–14.0)
NEUT#: 3.2 10*3/uL (ref 1.5–6.5)
NEUT%: 71.6 % (ref 38.4–76.8)
Platelets: 267 10*3/uL (ref 145–400)
RBC: 4.64 10*6/uL (ref 3.70–5.45)
RDW: 18.2 % — ABNORMAL HIGH (ref 11.2–14.5)
WBC: 4.5 10*3/uL (ref 3.9–10.3)

## 2014-03-08 LAB — LACTATE DEHYDROGENASE (CC13): LDH: 200 U/L (ref 125–245)

## 2014-03-08 MED ORDER — SODIUM CHLORIDE 0.9 % IJ SOLN
10.0000 mL | INTRAMUSCULAR | Status: DC | PRN
  Administered 2014-03-08: 10 mL via INTRAVENOUS
  Filled 2014-03-08: qty 10

## 2014-03-08 MED ORDER — HEPARIN SOD (PORK) LOCK FLUSH 100 UNIT/ML IV SOLN
500.0000 [IU] | Freq: Once | INTRAVENOUS | Status: AC
  Administered 2014-03-08: 500 [IU] via INTRAVENOUS
  Filled 2014-03-08: qty 5

## 2014-03-08 NOTE — Assessment & Plan Note (Signed)
The patient is in remission. I recommend history, physical examination and blood work in 3 months repeat imaging study in 6 months. She agreed. She wants to keep her port right now and I plan to flush every 6 weeks after today's port flush.

## 2014-03-08 NOTE — Assessment & Plan Note (Signed)
She is currently on FMLA. I recommended the patient rest a little bit more until she is ready to return back to work.

## 2014-03-08 NOTE — Progress Notes (Signed)
[facility] OFFICE PROGRESS NOTE  Patient Care Team: Berkley Harvey, NP as PCP - General (Nurse Practitioner) Izora Gala, MD as Consulting Physician (Otolaryngology) Melrose Nakayama, MD as Consulting Physician (Cardiothoracic Surgery)  SUMMARY OF ONCOLOGIC HISTORY: Oncology History   Hodgkin's lymphoma, nodular sclerosis   Primary site: Lymphoid Neoplasms   Staging method: AJCC 6th Edition   Clinical: Stage III signed by Heath Lark, MD on 12/29/2013  8:11 PM   Pathologic: Stage III signed by Heath Lark, MD on 12/29/2013  8:11 PM   Summary: Stage III       Hodgkin's lymphoma, nodular sclerosis   07/30/2013 Imaging CT scans show disease on both sides of the diaphragm with splenic involvement.   08/09/2013 Surgery Mediastinoscopy and biopsy confirmed classical Hodgkin lymphoma   08/14/2013 - 01/31/2014 Chemotherapy Patient was started on treatment with ABVD, completed 6 cycles of treatment.   11/22/2013 Imaging Repeat staging CT scans showed marked response to treatment.   03/01/2014 Imaging PET CT scan show complete remission.    INTERVAL HISTORY: Please see below for problem oriented charting. She feels well apart from fatigue.  REVIEW OF SYSTEMS:   Constitutional: Denies fevers, chills or abnormal weight loss Eyes: Denies blurriness of vision Ears, nose, mouth, throat, and face: Denies mucositis or sore throat Respiratory: Denies cough, dyspnea or wheezes Cardiovascular: Denies palpitation, chest discomfort or lower extremity swelling Gastrointestinal:  Denies nausea, heartburn or change in bowel habits Skin: Denies abnormal skin rashes Lymphatics: Denies new lymphadenopathy or easy bruising Neurological:Denies numbness, tingling or new weaknesses Behavioral/Psych: Mood is stable, no new changes  All other systems were reviewed with the patient and are negative.  I have reviewed the past medical history, past surgical history, social history and family history  with the patient and they are unchanged from previous note.  ALLERGIES:  is allergic to adhesive.  MEDICATIONS:  Current Outpatient Prescriptions  Medication Sig Dispense Refill  . ibuprofen (ADVIL,MOTRIN) 200 MG tablet Take 400-600 mg by mouth every 6 (six) hours as needed for fever, headache, mild pain, moderate pain or cramping.       . lidocaine-prilocaine (EMLA) cream Apply 1 application topically as needed. Apply to portacath site 1-2 hours prior to use.  30 g  3  . loratadine (CLARITIN) 10 MG tablet Take 10 mg by mouth daily.       Marland Kitchen LORazepam (ATIVAN) 1 MG tablet Take 1 tablet (1 mg total) by mouth every 6 (six) hours as needed for anxiety or sleep.  30 tablet  0  . ondansetron (ZOFRAN ODT) 8 MG disintegrating tablet Take 1 tablet (8 mg total) by mouth every 8 (eight) hours as needed for nausea or vomiting (Take every 8 hours for the next 48 hours, then as needed.).  30 tablet  3  . oxycodone (OXY-IR) 5 MG capsule Take 1 capsule (5 mg total) by mouth every 4 (four) hours as needed.  30 capsule  0  . polyethylene glycol (MIRALAX / GLYCOLAX) packet Take 17 g by mouth 2 (two) times daily.      Marland Kitchen triamcinolone cream (KENALOG) 0.1 % Apply 1 application topically daily as needed (for eczema flare ups).       Current Facility-Administered Medications  Medication Dose Route Frequency Provider Last Rate Last Dose  . sodium chloride 0.9 % injection 10 mL  10 mL Intravenous PRN Heath Lark, MD   10 mL at 03/08/14 1401    PHYSICAL EXAMINATION: ECOG PERFORMANCE STATUS: 0 - Asymptomatic  Filed Vitals:   03/08/14 1331  BP: 136/71  Pulse: 96  Temp: 99.4 F (37.4 C)  Resp: 18   Filed Weights   03/08/14 1331  Weight: 260 lb 11.2 oz (118.253 kg)    GENERAL:alert, no distress and comfortable SKIN: skin color, texture, turgor are normal, no rashes or significant lesions EYES: normal, Conjunctiva are pink and non-injected, sclera clear NEURO: alert & oriented x 3 with fluent speech, no focal  motor/sensory deficits  LABORATORY DATA:  I have reviewed the data as listed    Component Value Date/Time   NA 140 03/08/2014 1318   NA 138 09/09/2013 0940   K 4.0 03/08/2014 1318   K 4.1 09/09/2013 0940   CL 105 09/09/2013 0940   CO2 25 03/08/2014 1318   CO2 24 09/09/2013 0940   GLUCOSE 90 03/08/2014 1318   GLUCOSE 88 09/09/2013 0940   BUN 9.2 03/08/2014 1318   BUN 15 09/09/2013 0940   CREATININE 0.8 03/08/2014 1318   CREATININE 0.71 09/09/2013 0940   CALCIUM 8.9 03/08/2014 1318   CALCIUM 9.0 09/09/2013 0940   PROT 6.5 03/08/2014 1318   PROT 6.7 08/15/2013 0515   ALBUMIN 3.6 03/08/2014 1318   ALBUMIN 3.0* 08/15/2013 0515   AST 22 03/08/2014 1318   AST 8 08/15/2013 0515   ALT 34 03/08/2014 1318   ALT 16 08/15/2013 0515   ALKPHOS 79 03/08/2014 1318   ALKPHOS 85 08/15/2013 0515   BILITOT 0.28 03/08/2014 1318   BILITOT 0.1* 08/15/2013 0515   GFRNONAA >90 09/09/2013 0940   GFRAA >90 09/09/2013 0940    No results found for this basename: SPEP, UPEP,  kappa and lambda light chains    Lab Results  Component Value Date   WBC 4.5 03/08/2014   NEUTROABS 3.2 03/08/2014   HGB 12.0 03/08/2014   HCT 38.0 03/08/2014   MCV 81.9 03/08/2014   PLT 267 03/08/2014      Chemistry      Component Value Date/Time   NA 140 03/08/2014 1318   NA 138 09/09/2013 0940   K 4.0 03/08/2014 1318   K 4.1 09/09/2013 0940   CL 105 09/09/2013 0940   CO2 25 03/08/2014 1318   CO2 24 09/09/2013 0940   BUN 9.2 03/08/2014 1318   BUN 15 09/09/2013 0940   CREATININE 0.8 03/08/2014 1318   CREATININE 0.71 09/09/2013 0940      Component Value Date/Time   CALCIUM 8.9 03/08/2014 1318   CALCIUM 9.0 09/09/2013 0940   ALKPHOS 79 03/08/2014 1318   ALKPHOS 85 08/15/2013 0515   AST 22 03/08/2014 1318   AST 8 08/15/2013 0515   ALT 34 03/08/2014 1318   ALT 16 08/15/2013 0515   BILITOT 0.28 03/08/2014 1318   BILITOT 0.1* 08/15/2013 0515       RADIOGRAPHIC STUDIES: Revealed multiple PET CT scan with her and family. I have  personally reviewed the radiological images as listed and agreed with the findings in the report.  Her case was presented at the hematology tumor board this morning.  ASSESSMENT & PLAN:  Hodgkin's lymphoma, nodular sclerosis The patient is in remission. I recommend history, physical examination and blood work in 3 months repeat imaging study in 6 months. She agreed. She wants to keep her port right now and I plan to flush every 6 weeks after today's port flush.  Physical deconditioning She is currently on FMLA. I recommended the patient rest a little bit more until she is ready to return back to  work.    All questions were answered. The patient knows to call the clinic with any problems, questions or concerns. No barriers to learning was detected. I spent 25 minutes counseling the patient face to face. The total time spent in the appointment was 30 minutes and more than 50% was on counseling and review of test results     [facility], [place], MD 03/08/2014 2:09 PM

## 2014-03-10 ENCOUNTER — Telehealth: Payer: Self-pay

## 2014-03-10 ENCOUNTER — Telehealth: Payer: Self-pay | Admitting: Hematology and Oncology

## 2014-03-10 NOTE — Telephone Encounter (Signed)
Returned pt call re: RTW letter with revised date.  LMOVM that per cameo's ofc note, ltr was faxed to his ofc at [phone] on June 10.  As he has not received it, we will re-send.  Pt to call clinic if he has any questions or the letter turns up.

## 2014-03-10 NOTE — Telephone Encounter (Signed)
lvm for pt regarding to June/Aug and Sept appt...advised pt to get sched on Monday

## 2014-03-11 ENCOUNTER — Telehealth: Payer: Self-pay | Admitting: Hematology and Oncology

## 2014-03-11 NOTE — Telephone Encounter (Signed)
returned pt call and r/s appt...done....pt ok adn aware of all appt thru OCT

## 2014-03-14 ENCOUNTER — Telehealth: Payer: Self-pay | Admitting: *Deleted

## 2014-03-14 ENCOUNTER — Encounter: Payer: Self-pay | Admitting: Hematology and Oncology

## 2014-03-14 NOTE — Telephone Encounter (Signed)
Faxed letter dictated and signed by Dr. Alvy Bimler to pt as per father [name]'s request. Fax   [phone].

## 2014-03-14 NOTE — Telephone Encounter (Signed)
Called father [name] on cell phone and left message on voice mail re: a letter dictated and signed by Dr. Alvy Bimler is ready for [name] to pick up at his convenience.  Envelope left with Wilma at front desk.

## 2014-04-19 ENCOUNTER — Other Ambulatory Visit: Payer: Self-pay | Admitting: *Deleted

## 2014-04-19 ENCOUNTER — Telehealth: Payer: Self-pay | Admitting: *Deleted

## 2014-04-19 ENCOUNTER — Ambulatory Visit (HOSPITAL_BASED_OUTPATIENT_CLINIC_OR_DEPARTMENT_OTHER): Payer: 59

## 2014-04-19 ENCOUNTER — Other Ambulatory Visit (HOSPITAL_BASED_OUTPATIENT_CLINIC_OR_DEPARTMENT_OTHER): Payer: 59

## 2014-04-19 VITALS — BP 128/66 | HR 93 | Temp 97.7°F

## 2014-04-19 DIAGNOSIS — C8119 Nodular sclerosis classical Hodgkin lymphoma, extranodal and solid organ sites: Secondary | ICD-10-CM

## 2014-04-19 DIAGNOSIS — Z95828 Presence of other vascular implants and grafts: Secondary | ICD-10-CM

## 2014-04-19 DIAGNOSIS — C8110 Nodular sclerosis classical Hodgkin lymphoma, unspecified site: Secondary | ICD-10-CM

## 2014-04-19 LAB — COMPREHENSIVE METABOLIC PANEL (CC13)
ALT: 38 U/L (ref 0–55)
ANION GAP: 6 meq/L (ref 3–11)
AST: 22 U/L (ref 5–34)
Albumin: 3.5 g/dL (ref 3.5–5.0)
Alkaline Phosphatase: 81 U/L (ref 40–150)
BILIRUBIN TOTAL: 0.27 mg/dL (ref 0.20–1.20)
BUN: 11.4 mg/dL (ref 7.0–26.0)
CO2: 27 meq/L (ref 22–29)
CREATININE: 0.8 mg/dL (ref 0.6–1.1)
Calcium: 9.3 mg/dL (ref 8.4–10.4)
Chloride: 107 mEq/L (ref 98–109)
GLUCOSE: 96 mg/dL (ref 70–140)
Potassium: 4.3 mEq/L (ref 3.5–5.1)
Sodium: 139 mEq/L (ref 136–145)
Total Protein: 6.4 g/dL (ref 6.4–8.3)

## 2014-04-19 LAB — CBC WITH DIFFERENTIAL/PLATELET
BASO%: 0.8 % (ref 0.0–2.0)
Basophils Absolute: 0 10*3/uL (ref 0.0–0.1)
EOS ABS: 0 10*3/uL (ref 0.0–0.5)
EOS%: 0.9 % (ref 0.0–7.0)
HCT: 40.3 % (ref 34.8–46.6)
HGB: 12.9 g/dL (ref 11.6–15.9)
LYMPH%: 24 % (ref 14.0–49.7)
MCH: 26.2 pg (ref 25.1–34.0)
MCHC: 32 g/dL (ref 31.5–36.0)
MCV: 81.9 fL (ref 79.5–101.0)
MONO#: 0.3 10*3/uL (ref 0.1–0.9)
MONO%: 6.8 % (ref 0.0–14.0)
NEUT#: 3 10*3/uL (ref 1.5–6.5)
NEUT%: 67.5 % (ref 38.4–76.8)
PLATELETS: 287 10*3/uL (ref 145–400)
RBC: 4.92 10*6/uL (ref 3.70–5.45)
RDW: 15.4 % — ABNORMAL HIGH (ref 11.2–14.5)
WBC: 4.5 10*3/uL (ref 3.9–10.3)
lymph#: 1.1 10*3/uL (ref 0.9–3.3)

## 2014-04-19 MED ORDER — HEPARIN SOD (PORK) LOCK FLUSH 100 UNIT/ML IV SOLN
500.0000 [IU] | Freq: Once | INTRAVENOUS | Status: AC
  Administered 2014-04-19: 500 [IU] via INTRAVENOUS
  Filled 2014-04-19: qty 5

## 2014-04-19 MED ORDER — SODIUM CHLORIDE 0.9 % IJ SOLN
10.0000 mL | INTRAMUSCULAR | Status: DC | PRN
  Administered 2014-04-19: 10 mL via INTRAVENOUS
  Filled 2014-04-19: qty 10

## 2014-04-19 NOTE — Telephone Encounter (Signed)
Pt's father called to say he is concerned as  patient has been more fatigued lately. Understands that is part of the process and she has gone back to work. But would like her to have labs drawn today with port flush if possible.

## 2014-04-19 NOTE — Patient Instructions (Signed)

## 2014-04-19 NOTE — Telephone Encounter (Signed)
Message copied by Patton Salles on Tue Apr 19, 2014 10:12 AM ------      Message from: [facility], [place]      Created: Tue Apr 19, 2014 10:10 AM      Regarding: labs       All normal      ----- Message -----         From: [facility] in [facility] Interface         Sent: 04/19/2014   9:05 AM           To: Heath Lark, MD                   ------

## 2014-04-19 NOTE — Telephone Encounter (Signed)
Notified of results below 

## 2014-04-28 ENCOUNTER — Telehealth: Payer: Self-pay | Admitting: *Deleted

## 2014-04-28 NOTE — Telephone Encounter (Signed)
Asked pt to take a picture of her face/chest hives and send pt to Dr. Calton Dach cell phone.  Gave her the cell phone number. She agreed.

## 2014-04-28 NOTE — Telephone Encounter (Signed)
?  chicken pox?  Can she take a picture?

## 2014-04-28 NOTE — Telephone Encounter (Signed)
Message from Call a Nurse that pt called to report some hives on her face which were itching. Pt had started using a new face lotion recently.  They advised her on some skin care and antihistamines.  Called pt to check on her hives and left her a VM to return nurse's call.

## 2014-04-28 NOTE — Telephone Encounter (Signed)
Dr. Alvy Bimler saw pic of pt's rash on face/chest.  She instructs for pt to use Hydrocortisone cream 1% OTC on chest area, but do not use on face.  May use moisturizer on face, something w/o perfume such as Aveeno with oatmeal.  Take Benadryl 25 mg OTC TID for itching. Left detailed VM for pt on her cell phone w/ above instructions and to please call back tomorrow to let us know how she is doing.

## 2014-04-28 NOTE — Telephone Encounter (Signed)
Pt returned call left VM informing the hives on her face and chest she has had x 3 days.  They are very itchy and painful, but she thinks they may be drying up.  She did oatmeal bath last night and is avoiding using any creams or lotions.

## 2014-05-13 ENCOUNTER — Encounter: Payer: Self-pay | Admitting: *Deleted

## 2014-05-31 ENCOUNTER — Ambulatory Visit (HOSPITAL_BASED_OUTPATIENT_CLINIC_OR_DEPARTMENT_OTHER): Payer: 59

## 2014-05-31 VITALS — BP 126/66 | HR 95 | Temp 99.0°F | Resp 18

## 2014-05-31 DIAGNOSIS — Z452 Encounter for adjustment and management of vascular access device: Secondary | ICD-10-CM

## 2014-05-31 DIAGNOSIS — Z95828 Presence of other vascular implants and grafts: Secondary | ICD-10-CM

## 2014-05-31 DIAGNOSIS — C8119 Nodular sclerosis classical Hodgkin lymphoma, extranodal and solid organ sites: Secondary | ICD-10-CM

## 2014-05-31 MED ORDER — SODIUM CHLORIDE 0.9 % IJ SOLN
10.0000 mL | INTRAMUSCULAR | Status: DC | PRN
  Administered 2014-05-31: 10 mL via INTRAVENOUS
  Filled 2014-05-31: qty 10

## 2014-05-31 MED ORDER — SODIUM CHLORIDE 0.9 % IJ SOLN
10.0000 mL | INTRAMUSCULAR | Status: DC | PRN
  Filled 2014-05-31: qty 10

## 2014-05-31 MED ORDER — HEPARIN SOD (PORK) LOCK FLUSH 100 UNIT/ML IV SOLN
500.0000 [IU] | Freq: Once | INTRAVENOUS | Status: DC
  Filled 2014-05-31: qty 5

## 2014-05-31 MED ORDER — HEPARIN SOD (PORK) LOCK FLUSH 100 UNIT/ML IV SOLN
500.0000 [IU] | Freq: Once | INTRAVENOUS | Status: AC
  Administered 2014-05-31: 500 [IU] via INTRAVENOUS
  Filled 2014-05-31: qty 5

## 2014-05-31 NOTE — Patient Instructions (Signed)

## 2014-06-10 ENCOUNTER — Other Ambulatory Visit (HOSPITAL_BASED_OUTPATIENT_CLINIC_OR_DEPARTMENT_OTHER): Payer: 59

## 2014-06-10 ENCOUNTER — Encounter: Payer: Self-pay | Admitting: Hematology and Oncology

## 2014-06-10 ENCOUNTER — Ambulatory Visit (HOSPITAL_BASED_OUTPATIENT_CLINIC_OR_DEPARTMENT_OTHER): Payer: 59 | Admitting: Hematology and Oncology

## 2014-06-10 ENCOUNTER — Telehealth: Payer: Self-pay | Admitting: Hematology and Oncology

## 2014-06-10 VITALS — BP 112/54 | HR 84 | Temp 98.8°F | Resp 22 | Ht 69.0 in | Wt 258.5 lb

## 2014-06-10 DIAGNOSIS — L709 Acne, unspecified: Secondary | ICD-10-CM | POA: Insufficient documentation

## 2014-06-10 DIAGNOSIS — C8119 Nodular sclerosis classical Hodgkin lymphoma, extranodal and solid organ sites: Secondary | ICD-10-CM

## 2014-06-10 DIAGNOSIS — C8110 Nodular sclerosis classical Hodgkin lymphoma, unspecified site: Secondary | ICD-10-CM

## 2014-06-10 DIAGNOSIS — L708 Other acne: Secondary | ICD-10-CM

## 2014-06-10 DIAGNOSIS — Z23 Encounter for immunization: Secondary | ICD-10-CM

## 2014-06-10 DIAGNOSIS — R5381 Other malaise: Secondary | ICD-10-CM

## 2014-06-10 LAB — CBC WITH DIFFERENTIAL/PLATELET
BASO%: 0.7 % (ref 0.0–2.0)
BASOS ABS: 0 10*3/uL (ref 0.0–0.1)
EOS ABS: 0 10*3/uL (ref 0.0–0.5)
EOS%: 0.7 % (ref 0.0–7.0)
HCT: 41.6 % (ref 34.8–46.6)
HEMOGLOBIN: 13.2 g/dL (ref 11.6–15.9)
LYMPH#: 1 10*3/uL (ref 0.9–3.3)
LYMPH%: 17.8 % (ref 14.0–49.7)
MCH: 26.3 pg (ref 25.1–34.0)
MCHC: 31.6 g/dL (ref 31.5–36.0)
MCV: 83.3 fL (ref 79.5–101.0)
MONO#: 0.3 10*3/uL (ref 0.1–0.9)
MONO%: 5.8 % (ref 0.0–14.0)
NEUT%: 75 % (ref 38.4–76.8)
NEUTROS ABS: 4.2 10*3/uL (ref 1.5–6.5)
Platelets: 279 10*3/uL (ref 145–400)
RBC: 5 10*6/uL (ref 3.70–5.45)
RDW: 13.7 % (ref 11.2–14.5)
WBC: 5.6 10*3/uL (ref 3.9–10.3)

## 2014-06-10 LAB — COMPREHENSIVE METABOLIC PANEL (CC13)
ALK PHOS: 89 U/L (ref 40–150)
ALT: 23 U/L (ref 0–55)
AST: 13 U/L (ref 5–34)
Albumin: 3.6 g/dL (ref 3.5–5.0)
Anion Gap: 9 mEq/L (ref 3–11)
BILIRUBIN TOTAL: 0.33 mg/dL (ref 0.20–1.20)
BUN: 11.6 mg/dL (ref 7.0–26.0)
CO2: 25 mEq/L (ref 22–29)
Calcium: 9 mg/dL (ref 8.4–10.4)
Chloride: 106 mEq/L (ref 98–109)
Creatinine: 0.8 mg/dL (ref 0.6–1.1)
Glucose: 99 mg/dl (ref 70–140)
Potassium: 4.1 mEq/L (ref 3.5–5.1)
Sodium: 140 mEq/L (ref 136–145)
TOTAL PROTEIN: 6.7 g/dL (ref 6.4–8.3)

## 2014-06-10 LAB — LACTATE DEHYDROGENASE (CC13): LDH: 167 U/L (ref 125–245)

## 2014-06-10 MED ORDER — INFLUENZA VAC SPLIT QUAD 0.5 ML IM SUSY
0.5000 mL | PREFILLED_SYRINGE | Freq: Once | INTRAMUSCULAR | Status: AC
  Administered 2014-06-10: 0.5 mL via INTRAMUSCULAR
  Filled 2014-06-10: qty 0.5

## 2014-06-10 NOTE — Assessment & Plan Note (Signed)
Clinically, she has no signs of recurrence. I will order a PET CT scan to be done in 3 months along with history, physical examination and blood work. If she remained in remission, we'll get the port removed. We discussed the importance of preventive care and reviewed the vaccination programs. She does not have any prior allergic reactions to influenza vaccination. She agrees to proceed with influenza vaccination today and we will administer it today at the clinic.

## 2014-06-10 NOTE — Assessment & Plan Note (Signed)
She has mild acne on the face in the chest. She'll continue over-the-counter topical cream for now.

## 2014-06-10 NOTE — Progress Notes (Signed)
[place] OFFICE PROGRESS NOTE  Patient Care Team: Berkley Harvey, NP as PCP - General (Nurse Practitioner) Izora Gala, MD as Consulting Physician (Otolaryngology) Melrose Nakayama, MD as Consulting Physician (Cardiothoracic Surgery)  SUMMARY OF ONCOLOGIC HISTORY: Oncology History   Hodgkin's lymphoma, nodular sclerosis   Primary site: Lymphoid Neoplasms   Staging method: AJCC 6th Edition   Clinical: Stage III signed by Heath Lark, MD on 12/29/2013  8:11 PM   Pathologic: Stage III signed by Heath Lark, MD on 12/29/2013  8:11 PM   Summary: Stage III       Hodgkin's lymphoma, nodular sclerosis   07/30/2013 Imaging CT scans show disease on both sides of the diaphragm with splenic involvement.   08/09/2013 Surgery Mediastinoscopy and biopsy confirmed classical Hodgkin lymphoma   08/14/2013 - 01/31/2014 Chemotherapy Patient was started on treatment with ABVD, completed 6 cycles of treatment.   11/22/2013 Imaging Repeat staging CT scans showed marked response to treatment.   03/01/2014 Imaging PET CT scan show complete remission.    INTERVAL HISTORY: Please see below for problem oriented charting. She denies new symptoms. No new lymphadenopathy. She complained of skin rash and acne.  REVIEW OF SYSTEMS:   Constitutional: Denies fevers, chills or abnormal weight loss Eyes: Denies blurriness of vision Ears, nose, mouth, throat, and face: Denies mucositis or sore throat Respiratory: Denies cough, dyspnea or wheezes Cardiovascular: Denies palpitation, chest discomfort or lower extremity swelling Gastrointestinal:  Denies nausea, heartburn or change in bowel habits Lymphatics: Denies new lymphadenopathy or easy bruising Neurological:Denies numbness, tingling or new weaknesses Behavioral/Psych: Mood is stable, no new changes  All other systems were reviewed with the patient and are negative.  I have reviewed the past medical history, past surgical history, social history and  family history with the patient and they are unchanged from previous note.  ALLERGIES:  is allergic to adhesive.  MEDICATIONS:  Current Outpatient Prescriptions  Medication Sig Dispense Refill  . ibuprofen (ADVIL,MOTRIN) 200 MG tablet Take 400-600 mg by mouth every 6 (six) hours as needed for fever, headache, mild pain, moderate pain or cramping.       . lidocaine-prilocaine (EMLA) cream Apply 1 application topically as needed. Apply to portacath site 1-2 hours prior to use.  30 g  3  . loratadine (CLARITIN) 10 MG tablet Take 10 mg by mouth daily.       . polyethylene glycol (MIRALAX / GLYCOLAX) packet Take 17 g by mouth daily as needed.       . triamcinolone cream (KENALOG) 0.1 % Apply 1 application topically daily as needed (for eczema flare ups).       No current facility-administered medications for this visit.    PHYSICAL EXAMINATION: ECOG PERFORMANCE STATUS: 0 - Asymptomatic  Filed Vitals:   06/10/14 0817  BP: 112/54  Pulse: 84  Temp: 98.8 F (37.1 C)  Resp: 22   Filed Weights   06/10/14 0817  Weight: 258 lb 8 oz (117.255 kg)    GENERAL:alert, no distress and comfortable. She is morbidly obese SKIN: Mild acne on her face and her chest.  EYES: normal, Conjunctiva are pink and non-injected, sclera clear OROPHARYNX:no exudate, no erythema and lips, buccal mucosa, and tongue normal  NECK: supple, thyroid normal size, non-tender, without nodularity LYMPH:  no palpable lymphadenopathy in the cervical, axillary or inguinal LUNGS: clear to auscultation and percussion with normal breathing effort HEART: regular rate & rhythm and no murmurs and no lower extremity edema ABDOMEN:abdomen soft, non-tender and  normal bowel sounds Musculoskeletal:no cyanosis of digits and no clubbing  NEURO: alert & oriented x 3 with fluent speech, no focal motor/sensory deficits  LABORATORY DATA:  I have reviewed the data as listed    Component Value Date/Time   NA 140 06/10/2014 0804   NA 138  09/09/2013 0940   K 4.1 06/10/2014 0804   K 4.1 09/09/2013 0940   CL 105 09/09/2013 0940   CO2 25 06/10/2014 0804   CO2 24 09/09/2013 0940   GLUCOSE 99 06/10/2014 0804   GLUCOSE 88 09/09/2013 0940   BUN 11.6 06/10/2014 0804   BUN 15 09/09/2013 0940   CREATININE 0.8 06/10/2014 0804   CREATININE 0.71 09/09/2013 0940   CALCIUM 9.0 06/10/2014 0804   CALCIUM 9.0 09/09/2013 0940   PROT 6.7 06/10/2014 0804   PROT 6.7 08/15/2013 0515   ALBUMIN 3.6 06/10/2014 0804   ALBUMIN 3.0* 08/15/2013 0515   AST 13 06/10/2014 0804   AST 8 08/15/2013 0515   ALT 23 06/10/2014 0804   ALT 16 08/15/2013 0515   ALKPHOS 89 06/10/2014 0804   ALKPHOS 85 08/15/2013 0515   BILITOT 0.33 06/10/2014 0804   BILITOT 0.1* 08/15/2013 0515   GFRNONAA >90 09/09/2013 0940   GFRAA >90 09/09/2013 0940    No results found for this basename: SPEP, UPEP,  kappa and lambda light chains    Lab Results  Component Value Date   WBC 5.6 06/10/2014   NEUTROABS 4.2 06/10/2014   HGB 13.2 06/10/2014   HCT 41.6 06/10/2014   MCV 83.3 06/10/2014   PLT 279 06/10/2014      Chemistry      Component Value Date/Time   NA 140 06/10/2014 0804   NA 138 09/09/2013 0940   K 4.1 06/10/2014 0804   K 4.1 09/09/2013 0940   CL 105 09/09/2013 0940   CO2 25 06/10/2014 0804   CO2 24 09/09/2013 0940   BUN 11.6 06/10/2014 0804   BUN 15 09/09/2013 0940   CREATININE 0.8 06/10/2014 0804   CREATININE 0.71 09/09/2013 0940      Component Value Date/Time   CALCIUM 9.0 06/10/2014 0804   CALCIUM 9.0 09/09/2013 0940   ALKPHOS 89 06/10/2014 0804   ALKPHOS 85 08/15/2013 0515   AST 13 06/10/2014 0804   AST 8 08/15/2013 0515   ALT 23 06/10/2014 0804   ALT 16 08/15/2013 0515   BILITOT 0.33 06/10/2014 0804   BILITOT 0.1* 08/15/2013 0515      ASSESSMENT & PLAN:  Hodgkin's lymphoma, nodular sclerosis Clinically, she has no signs of recurrence. I will order a PET CT scan to be done in 3 months along with history, physical examination and blood work. If she remained  in remission, we'll get the port removed. We discussed the importance of preventive care and reviewed the vaccination programs. She does not have any prior allergic reactions to influenza vaccination. She agrees to proceed with influenza vaccination today and we will administer it today at the clinic.   Physical deconditioning She is improving slowly. She continues to work as tolerated.  Acne She has mild acne on the face in the chest. She'll continue over-the-counter topical cream for now.   Orders Placed This Encounter  Procedures  . NM PET Image Restag (PS) Skull Base To Thigh    Standing Status: Future     Number of Occurrences:      Standing Expiration Date: 08/10/2015    Order Specific Question:  Reason for Exam (SYMPTOM  OR DIAGNOSIS  REQUIRED)    Answer:  hodgkin lymphoma, exlude recurrence    Order Specific Question:  Is the patient pregnant?    Answer:  No    Order Specific Question:  Preferred imaging location?    Answer:  [facility]   All questions were answered. The patient knows to call the clinic with any problems, questions or concerns. No barriers to learning was detected. I spent 25 minutes counseling the patient face to face. The total time spent in the appointment was 30 minutes and more than 50% was on counseling and review of test results     [facility], [place], MD 06/10/2014 4:26 PM

## 2014-06-10 NOTE — Assessment & Plan Note (Signed)
She is improving slowly. She continues to work as tolerated.

## 2014-06-10 NOTE — Telephone Encounter (Signed)
lvm for pt regarding to Dec appt.....advised pt that the Pet scan needed to be sched after lab and Flush....gv pt cs #

## 2014-06-21 ENCOUNTER — Telehealth: Payer: Self-pay | Admitting: *Deleted

## 2014-06-21 NOTE — Telephone Encounter (Signed)
Pt states needs letter for her job stating she is able to return to work full time without any restrictions.  Letter signed by Dr. Alvy Bimler and faxed to [phone] per pt request.

## 2014-07-08 ENCOUNTER — Telehealth: Payer: Self-pay | Admitting: Nurse Practitioner

## 2014-07-08 NOTE — Telephone Encounter (Signed)
Patient calling to request our office to resend letter to work which states she can return to all work duties. Initially faxed on 06/21/14 with confirmation. Will resend to new fax number.  Pt also requesting PET reschedule to allow port access/flush with lab draw. RN to follow up.

## 2014-07-12 ENCOUNTER — Other Ambulatory Visit: Payer: Self-pay | Admitting: *Deleted

## 2014-07-12 ENCOUNTER — Ambulatory Visit (HOSPITAL_BASED_OUTPATIENT_CLINIC_OR_DEPARTMENT_OTHER): Payer: 59

## 2014-07-12 VITALS — BP 134/56 | HR 87 | Temp 98.1°F

## 2014-07-12 DIAGNOSIS — C8119 Nodular sclerosis classical Hodgkin lymphoma, extranodal and solid organ sites: Secondary | ICD-10-CM

## 2014-07-12 DIAGNOSIS — Z95828 Presence of other vascular implants and grafts: Secondary | ICD-10-CM

## 2014-07-12 DIAGNOSIS — Z452 Encounter for adjustment and management of vascular access device: Secondary | ICD-10-CM

## 2014-07-12 MED ORDER — SODIUM CHLORIDE 0.9 % IJ SOLN
10.0000 mL | INTRAMUSCULAR | Status: DC | PRN
  Administered 2014-07-12: 10 mL via INTRAVENOUS
  Filled 2014-07-12: qty 10

## 2014-07-12 MED ORDER — HEPARIN SOD (PORK) LOCK FLUSH 100 UNIT/ML IV SOLN
500.0000 [IU] | Freq: Once | INTRAVENOUS | Status: AC
  Administered 2014-07-12: 500 [IU] via INTRAVENOUS
  Filled 2014-07-12: qty 5

## 2014-07-12 NOTE — Patient Instructions (Signed)

## 2014-07-13 ENCOUNTER — Telehealth: Payer: Self-pay | Admitting: Hematology and Oncology

## 2014-07-13 NOTE — Telephone Encounter (Signed)
lvm for pt regarding to Dec 7th time change....mailed pt appt sched and letter

## 2014-08-29 ENCOUNTER — Ambulatory Visit (HOSPITAL_BASED_OUTPATIENT_CLINIC_OR_DEPARTMENT_OTHER): Payer: 59

## 2014-08-29 ENCOUNTER — Ambulatory Visit (HOSPITAL_COMMUNITY): Payer: 59

## 2014-08-29 ENCOUNTER — Other Ambulatory Visit (HOSPITAL_BASED_OUTPATIENT_CLINIC_OR_DEPARTMENT_OTHER): Payer: 59

## 2014-08-29 ENCOUNTER — Other Ambulatory Visit: Payer: 59

## 2014-08-29 ENCOUNTER — Encounter (HOSPITAL_COMMUNITY)
Admission: RE | Admit: 2014-08-29 | Discharge: 2014-08-29 | Disposition: A | Discharge status: Home / Self Care | Payer: 59 | Source: Ambulatory Visit | Attending: Hematology and Oncology | Admitting: Hematology and Oncology

## 2014-08-29 VITALS — BP 103/51 | HR 53 | Temp 98.0°F

## 2014-08-29 DIAGNOSIS — C8110 Nodular sclerosis classical Hodgkin lymphoma, unspecified site: Secondary | ICD-10-CM | POA: Diagnosis present

## 2014-08-29 DIAGNOSIS — Z95828 Presence of other vascular implants and grafts: Secondary | ICD-10-CM

## 2014-08-29 LAB — CBC WITH DIFFERENTIAL/PLATELET
BASO%: 0.7 % (ref 0.0–2.0)
BASOS ABS: 0 10*3/uL (ref 0.0–0.1)
EOS%: 1.1 % (ref 0.0–7.0)
Eosinophils Absolute: 0 10*3/uL (ref 0.0–0.5)
HEMATOCRIT: 40.5 % (ref 34.8–46.6)
HEMOGLOBIN: 12.9 g/dL (ref 11.6–15.9)
LYMPH%: 29 % (ref 14.0–49.7)
MCH: 26.5 pg (ref 25.1–34.0)
MCHC: 31.8 g/dL (ref 31.5–36.0)
MCV: 83.3 fL (ref 79.5–101.0)
MONO#: 0.2 10*3/uL (ref 0.1–0.9)
MONO%: 5.7 % (ref 0.0–14.0)
NEUT#: 2.7 10*3/uL (ref 1.5–6.5)
NEUT%: 63.5 % (ref 38.4–76.8)
PLATELETS: 279 10*3/uL (ref 145–400)
RBC: 4.87 10*6/uL (ref 3.70–5.45)
RDW: 13.4 % (ref 11.2–14.5)
WBC: 4.2 10*3/uL (ref 3.9–10.3)
lymph#: 1.2 10*3/uL (ref 0.9–3.3)

## 2014-08-29 LAB — COMPREHENSIVE METABOLIC PANEL (CC13)
ALT: 20 U/L (ref 0–55)
ANION GAP: 9 meq/L (ref 3–11)
AST: 10 U/L (ref 5–34)
Albumin: 3.6 g/dL (ref 3.5–5.0)
Alkaline Phosphatase: 101 U/L (ref 40–150)
BILIRUBIN TOTAL: 0.31 mg/dL (ref 0.20–1.20)
BUN: 10.6 mg/dL (ref 7.0–26.0)
CHLORIDE: 106 meq/L (ref 98–109)
CO2: 25 meq/L (ref 22–29)
Calcium: 9.3 mg/dL (ref 8.4–10.4)
Creatinine: 0.8 mg/dL (ref 0.6–1.1)
EGFR: >90 mL/min/{1.73_m2} (ref >90)
Glucose: 91 mg/dl (ref 70–140)
Potassium: 4.1 mEq/L (ref 3.5–5.1)
Sodium: 140 mEq/L (ref 136–145)
Total Protein: 6.5 g/dL (ref 6.4–8.3)

## 2014-08-29 LAB — LACTATE DEHYDROGENASE (CC13): LDH: 176 U/L (ref 125–245)

## 2014-08-29 LAB — GLUCOSE, CAPILLARY: GLUCOSE-CAPILLARY: 79 mg/dL (ref 70–99)

## 2014-08-29 MED ORDER — SODIUM CHLORIDE 0.9 % IJ SOLN
10.0000 mL | INTRAMUSCULAR | Status: DC | PRN
  Administered 2014-08-29: 10 mL via INTRAVENOUS
  Filled 2014-08-29: qty 10

## 2014-08-29 MED ORDER — FLUDEOXYGLUCOSE F - 18 (FDG) INJECTION
13.2000 | Freq: Once | INTRAVENOUS | Status: AC | PRN
  Administered 2014-08-29: 13.2 via INTRAVENOUS

## 2014-08-30 ENCOUNTER — Telehealth: Payer: Self-pay | Admitting: *Deleted

## 2014-08-30 ENCOUNTER — Telehealth: Payer: Self-pay | Admitting: Hematology and Oncology

## 2014-08-30 NOTE — Telephone Encounter (Signed)
-----   Message from Heath Lark, MD sent at 08/30/2014  7:49 AM EST ----- Regarding: review of PET scans PLease call her that I need to review her scans at Hem tumor board next week before meeting with the patient. Recommend rescheduling her appt from tomorrow to 330 pm next Tuesday. Can you call to see if she is OK with that plan?

## 2014-08-30 NOTE — Telephone Encounter (Signed)
Spoke with pt and instructed pt re:  Per Dr. Alvy Bimler: 1.   Take picture of the rash so md can review at next office visit. 2.   Apply  Dry  Gauze. 3.   Take Benadryl  25mg  po  TID ( cautioned pt to watch for drowsiness )  X 3 days. 4.   If no relief, call office back and Dr. Alvy Bimler would prescribe  Prednisone 20mg  po  X  5 days. Pt voiced understanding. Pt's  Work phone    [phone].

## 2014-08-30 NOTE — Telephone Encounter (Signed)
Spoke with patient. She is fine with changing her appt to next Tuesday at 3:30. Note to scheduler.

## 2014-08-30 NOTE — Telephone Encounter (Signed)
Received call from pt wanting to know reason of office visit appt for tomorrow was cancelled  Called pt back and gave pt explanations why office appt was rescheduled.  Pt was satisfied with the explanations.   Pt also stated she has noticed a small rash on Right forearm about a month ago.  Pt thought it was a bug bite.  However, last week, pt noticed the rash was increased in size of half a dollar, very swollen, very red, very itchy, and oozing clear liquid, has no odor.   Pt has been applying Hydrocortisone cream with no relief.    Message to md. Pt's  Phone   [phone].

## 2014-08-30 NOTE — Telephone Encounter (Signed)
s.w. pt and comfirmed 12.15 appt.Marland KitchenMarland KitchenMarland KitchenMarland Kitchenpt ok and aware

## 2014-08-31 ENCOUNTER — Telehealth: Payer: Self-pay | Admitting: *Deleted

## 2014-08-31 ENCOUNTER — Ambulatory Visit: Payer: 59 | Admitting: Hematology and Oncology

## 2014-08-31 NOTE — Telephone Encounter (Signed)
Father has left VM x 2 today.  He is very concerned about results of PET scan.  He says pt very upset and they are anxious and scared.  Explained to father that Dr. Alvy Bimler would like to present PET to other MDs and radiologist at conference tumor Board on Tuesday before she is able to give any definitive answers.   He verbalized understanding but wants Dr. Alvy Bimler to know they are very anxious for any results.  He had some specific questions about a "mass that was there before" and asking if it has grown?   Informed Dr. Alvy Bimler of father and patient's anxiety and she said she can meet w/ them tomorrow at 3:30 pm to review PET scan.  She will need to meet w/ pt again after Tumor Board Tuesday.     Called Father and informed Dr. Alvy Bimler can see pt tomorrow.  He will inform pt of appt.Miranda Villegas

## 2014-09-01 ENCOUNTER — Encounter: Payer: Self-pay | Admitting: Hematology and Oncology

## 2014-09-01 ENCOUNTER — Ambulatory Visit (HOSPITAL_BASED_OUTPATIENT_CLINIC_OR_DEPARTMENT_OTHER): Payer: 59 | Admitting: Hematology and Oncology

## 2014-09-01 VITALS — BP 141/75 | HR 108 | Temp 98.8°F | Resp 18 | Ht 69.0 in | Wt 264.6 lb

## 2014-09-01 DIAGNOSIS — L0889 Other specified local infections of the skin and subcutaneous tissue: Secondary | ICD-10-CM

## 2014-09-01 DIAGNOSIS — C8110 Nodular sclerosis classical Hodgkin lymphoma, unspecified site: Secondary | ICD-10-CM

## 2014-09-01 DIAGNOSIS — L709 Acne, unspecified: Secondary | ICD-10-CM

## 2014-09-01 DIAGNOSIS — B369 Superficial mycosis, unspecified: Secondary | ICD-10-CM

## 2014-09-01 DIAGNOSIS — L708 Other acne: Secondary | ICD-10-CM

## 2014-09-01 HISTORY — DX: Superficial mycosis, unspecified: B36.9

## 2014-09-01 MED ORDER — CLINDAMYCIN PHOSPHATE 1 % EX GEL: Freq: Two times a day (BID) | CUTANEOUS | Status: DC

## 2014-09-01 MED ORDER — TERBINAFINE HCL 1 % EX CREA: 1.0000 "application " | TOPICAL_CREAM | Freq: Two times a day (BID) | CUTANEOUS | Status: DC

## 2014-09-01 NOTE — Assessment & Plan Note (Signed)
Clinically, she has no signs of disease progression. She has mild occasional sweats at night but she attributed that to feeling hot even in the wintertime. It is not clear to me, based on review of the imaging study whether she have disease progression or not. I recommend hematology tumor board discussion next week and see her back afterwards to review the final recommendation. Options include repeat imaging in 6-8 weeks or repeat biopsy.

## 2014-09-01 NOTE — Progress Notes (Signed)
[place] OFFICE PROGRESS NOTE  Patient Care Team: Berkley Harvey, NP as PCP - General (Nurse Practitioner) Izora Gala, MD as Consulting Physician (Otolaryngology) Melrose Nakayama, MD as Consulting Physician (Cardiothoracic Surgery)  SUMMARY OF ONCOLOGIC HISTORY: Oncology History   Hodgkin's lymphoma, nodular sclerosis   Primary site: Lymphoid Neoplasms   Staging method: AJCC 6th Edition   Clinical: Stage III signed by Heath Lark, MD on 12/29/2013  8:11 PM   Pathologic: Stage III signed by Heath Lark, MD on 12/29/2013  8:11 PM   Summary: Stage III       Hodgkin's lymphoma, nodular sclerosis   07/30/2013 Imaging CT scans show disease on both sides of the diaphragm with splenic involvement.   08/09/2013 Surgery Mediastinoscopy and biopsy confirmed classical Hodgkin lymphoma   08/14/2013 - 01/31/2014 Chemotherapy Patient was started on treatment with ABVD, completed 6 cycles of treatment.   11/22/2013 Imaging Repeat staging CT scans showed marked response to treatment.   03/01/2014 Imaging PET CT scan show complete remission.   08/29/2014 Imaging RepeaT PET CT scan showed regression in the size of LN but with mild increased hypermetabolic activity.    INTERVAL HISTORY: Please see below for problem oriented charting. She is seen to review results of her scan. She complained of occasional sweats at night. She also complained of new skin rash on her arm and also acne on her face.  REVIEW OF SYSTEMS:   Constitutional: Denies fevers, chills or abnormal weight loss Eyes: Denies blurriness of vision Ears, nose, mouth, throat, and face: Denies mucositis or sore throat Respiratory: Denies cough, dyspnea or wheezes Cardiovascular: Denies palpitation, chest discomfort or lower extremity swelling Gastrointestinal:  Denies nausea, heartburn or change in bowel habits Lymphatics: Denies new lymphadenopathy or easy bruising Neurological:Denies numbness, tingling or new  weaknesses Behavioral/Psych: Mood is stable, no new changes  All other systems were reviewed with the patient and are negative.  I have reviewed the past medical history, past surgical history, social history and family history with the patient and they are unchanged from previous note.  ALLERGIES:  is allergic to adhesive.  MEDICATIONS:  Current Outpatient Prescriptions  Medication Sig Dispense Refill  . clindamycin (CLEOCIN T) 1 % lotion Apply topically as needed. Prn for acne    . hydrocortisone 2.5 % lotion Apply topically as needed.    Marland Kitchen ibuprofen (ADVIL,MOTRIN) 200 MG tablet Take 400-600 mg by mouth every 6 (six) hours as needed for fever, headache, mild pain, moderate pain or cramping.     . lidocaine-prilocaine (EMLA) cream Apply 1 application topically as needed. Apply to portacath site 1-2 hours prior to use. 30 g 3  . loratadine (CLARITIN) 10 MG tablet Take 10 mg by mouth daily.     . polyethylene glycol (MIRALAX / GLYCOLAX) packet Take 17 g by mouth daily as needed.     . triamcinolone cream (KENALOG) 0.1 % Apply 1 application topically daily as needed (for eczema flare ups).    . clindamycin (CLINDAGEL) 1 % gel Apply topically 2 (two) times daily. 30 g 0  . terbinafine (LAMISIL) 1 % cream Apply 1 application topically 2 (two) times daily. 30 g 0   No current facility-administered medications for this visit.    PHYSICAL EXAMINATION: ECOG PERFORMANCE STATUS: 0 - Asymptomatic  Filed Vitals:   09/01/14 1527  BP: 141/75  Pulse: 108  Temp: 98.8 F (37.1 C)  Resp: 18   Filed Weights   09/01/14 1527  Weight: 264 lb 9.6  oz (120.022 kg)    GENERAL:alert, no distress and comfortable. She is morbidly obese SKIN: Noted ringworm on her arm. also noted acne on her face EYES: normal, Conjunctiva are pink and non-injected, sclera clear OROPHARYNX:no exudate, no erythema and lips, buccal mucosa, and tongue normal  NECK: supple, thyroid normal size, non-tender, without  nodularity LYMPH:  no palpable lymphadenopathy in the cervical, axillary or inguinal LUNGS: clear to auscultation and percussion with normal breathing effort HEART: regular rate & rhythm and no murmurs and no lower extremity edema ABDOMEN:abdomen soft, non-tender and normal bowel sounds Musculoskeletal:no cyanosis of digits and no clubbing  NEURO: alert & oriented x 3 with fluent speech, no focal motor/sensory deficits  LABORATORY DATA:  I have reviewed the data as listed    Component Value Date/Time   NA 140 08/29/2014 1014   NA 138 09/09/2013 0940   K 4.1 08/29/2014 1014   K 4.1 09/09/2013 0940   CL 105 09/09/2013 0940   CO2 25 08/29/2014 1014   CO2 24 09/09/2013 0940   GLUCOSE 91 08/29/2014 1014   GLUCOSE 88 09/09/2013 0940   BUN 10.6 08/29/2014 1014   BUN 15 09/09/2013 0940   CREATININE 0.8 08/29/2014 1014   CREATININE 0.71 09/09/2013 0940   CALCIUM 9.3 08/29/2014 1014   CALCIUM 9.0 09/09/2013 0940   PROT 6.5 08/29/2014 1014   PROT 6.7 08/15/2013 0515   ALBUMIN 3.6 08/29/2014 1014   ALBUMIN 3.0* 08/15/2013 0515   AST 10 08/29/2014 1014   AST 8 08/15/2013 0515   ALT 20 08/29/2014 1014   ALT 16 08/15/2013 0515   ALKPHOS 101 08/29/2014 1014   ALKPHOS 85 08/15/2013 0515   BILITOT 0.31 08/29/2014 1014   BILITOT 0.1* 08/15/2013 0515   GFRNONAA >90 09/09/2013 0940   GFRAA >90 09/09/2013 0940    No results found for: SPEP, UPEP  Lab Results  Component Value Date   WBC 4.2 08/29/2014   NEUTROABS 2.7 08/29/2014   HGB 12.9 08/29/2014   HCT 40.5 08/29/2014   MCV 83.3 08/29/2014   PLT 279 08/29/2014      Chemistry      Component Value Date/Time   NA 140 08/29/2014 1014   NA 138 09/09/2013 0940   K 4.1 08/29/2014 1014   K 4.1 09/09/2013 0940   CL 105 09/09/2013 0940   CO2 25 08/29/2014 1014   CO2 24 09/09/2013 0940   BUN 10.6 08/29/2014 1014   BUN 15 09/09/2013 0940   CREATININE 0.8 08/29/2014 1014   CREATININE 0.71 09/09/2013 0940      Component Value  Date/Time   CALCIUM 9.3 08/29/2014 1014   CALCIUM 9.0 09/09/2013 0940   ALKPHOS 101 08/29/2014 1014   ALKPHOS 85 08/15/2013 0515   AST 10 08/29/2014 1014   AST 8 08/15/2013 0515   ALT 20 08/29/2014 1014   ALT 16 08/15/2013 0515   BILITOT 0.31 08/29/2014 1014   BILITOT 0.1* 08/15/2013 0515      ASSESSMENT & PLAN:  Hodgkin's lymphoma, nodular sclerosis Clinically, she has no signs of disease progression. She has mild occasional sweats at night but she attributed that to feeling hot even in the wintertime. It is not clear to me, based on review of the imaging study whether she have disease progression or not. I recommend hematology tumor board discussion next week and see her back afterwards to review the final recommendation. Options include repeat imaging in 6-8 weeks or repeat biopsy.  Acne I gave her prescription topical clindamycin  cream for acne  Fungal skin infection I give her a prescription of topical antifungal cream to treat the ringworm.  All questions were answered. The patient knows to call the clinic with any problems, questions or concerns. No barriers to learning was detected. I spent 30 minutes counseling the patient face to face. The total time spent in the appointment was 40 minutes and more than 50% was on counseling and review of test results     [facility], [place], MD 09/01/2014 8:07 PM

## 2014-09-01 NOTE — Assessment & Plan Note (Signed)
I gave her prescription topical clindamycin cream for acne

## 2014-09-01 NOTE — Assessment & Plan Note (Signed)
I give her a prescription of topical antifungal cream to treat the ringworm.

## 2014-09-06 ENCOUNTER — Ambulatory Visit (HOSPITAL_BASED_OUTPATIENT_CLINIC_OR_DEPARTMENT_OTHER): Payer: 59 | Admitting: Hematology and Oncology

## 2014-09-06 ENCOUNTER — Encounter: Payer: Self-pay | Admitting: Hematology and Oncology

## 2014-09-06 VITALS — BP 117/70 | HR 79 | Temp 98.1°F | Resp 22 | Ht 69.0 in | Wt 268.8 lb

## 2014-09-06 DIAGNOSIS — B369 Superficial mycosis, unspecified: Secondary | ICD-10-CM

## 2014-09-06 DIAGNOSIS — L0889 Other specified local infections of the skin and subcutaneous tissue: Secondary | ICD-10-CM

## 2014-09-06 DIAGNOSIS — C8110 Nodular sclerosis classical Hodgkin lymphoma, unspecified site: Secondary | ICD-10-CM

## 2014-09-06 NOTE — Assessment & Plan Note (Signed)
We discussed treatment recommendation from hematology tumor board today. Overall consensus is to proceed with repeat imaging study in 8 weeks. I will present her next imaging study at the hematology tumor board and see her back afterwards. In the meantime, we will keep the port patent.

## 2014-09-06 NOTE — Assessment & Plan Note (Signed)
This is improved on antifungal cream.

## 2014-09-06 NOTE — Progress Notes (Signed)
[facility] OFFICE PROGRESS NOTE  Patient Care Team: Berkley Harvey, NP as PCP - General (Nurse Practitioner) Izora Gala, MD as Consulting Physician (Otolaryngology) Melrose Nakayama, MD as Consulting Physician (Cardiothoracic Surgery)  SUMMARY OF ONCOLOGIC HISTORY: Oncology History   Hodgkin's lymphoma, nodular sclerosis   Primary site: Lymphoid Neoplasms   Staging method: AJCC 6th Edition   Clinical: Stage III signed by Heath Lark, MD on 12/29/2013  8:11 PM   Pathologic: Stage III signed by Heath Lark, MD on 12/29/2013  8:11 PM   Summary: Stage III       Hodgkin's lymphoma, nodular sclerosis   07/30/2013 Imaging CT scans show disease on both sides of the diaphragm with splenic involvement.   08/09/2013 Surgery Mediastinoscopy and biopsy confirmed classical Hodgkin lymphoma   08/14/2013 - 01/31/2014 Chemotherapy Patient was started on treatment with ABVD, completed 6 cycles of treatment.   11/22/2013 Imaging Repeat staging CT scans showed marked response to treatment.   03/01/2014 Imaging PET CT scan show complete remission.   08/29/2014 Imaging RepeaT PET CT scan showed regression in the size of LN but with mild increased hypermetabolic activity.    INTERVAL HISTORY: Please see below for problem oriented charting. She returns to follow-up on treatment recommendation. In the meantime, her skin lesion has improved. REVIEW OF SYSTEMS:   Constitutional: Denies fevers, chills or abnormal weight loss Eyes: Denies blurriness of vision Ears, nose, mouth, throat, and face: Denies mucositis or sore throat Respiratory: Denies cough, dyspnea or wheezes Cardiovascular: Denies palpitation, chest discomfort or lower extremity swelling Gastrointestinal:  Denies nausea, heartburn or change in bowel habits Skin: Denies abnormal skin rashes Lymphatics: Denies new lymphadenopathy or easy bruising Neurological:Denies numbness, tingling or new weaknesses Behavioral/Psych: Mood is  stable, no new changes  All other systems were reviewed with the patient and are negative.  I have reviewed the past medical history, past surgical history, social history and family history with the patient and they are unchanged from previous note.  ALLERGIES:  is allergic to adhesive.  MEDICATIONS:  Current Outpatient Prescriptions  Medication Sig Dispense Refill  . ibuprofen (ADVIL,MOTRIN) 200 MG tablet Take 400-600 mg by mouth every 6 (six) hours as needed for fever, headache, mild pain, moderate pain or cramping.     . lidocaine-prilocaine (EMLA) cream Apply 1 application topically as needed. Apply to portacath site 1-2 hours prior to use. 30 g 3  . loratadine (CLARITIN) 10 MG tablet Take 10 mg by mouth daily.     . polyethylene glycol (MIRALAX / GLYCOLAX) packet Take 17 g by mouth daily as needed.     . terbinafine (LAMISIL) 1 % cream Apply 1 application topically 2 (two) times daily. 30 g 0  . triamcinolone cream (KENALOG) 0.1 % Apply 1 application topically daily as needed (for eczema flare ups).     No current facility-administered medications for this visit.    PHYSICAL EXAMINATION: ECOG PERFORMANCE STATUS: 0 - Asymptomatic  Filed Vitals:   09/06/14 1530  BP: 117/70  Pulse: 79  Temp: 98.1 F (36.7 C)  Resp: 22   Filed Weights   09/06/14 1530  Weight: 268 lb 12.8 oz (121.927 kg)    GENERAL:alert, no distress and comfortable SKIN: Her skin lesion has improved.  Musculoskeletal:no cyanosis of digits and no clubbing  NEURO: alert & oriented x 3 with fluent speech, no focal motor/sensory deficits  LABORATORY DATA:  I have reviewed the data as listed    Component Value Date/Time  NA 140 08/29/2014 1014   NA 138 09/09/2013 0940   K 4.1 08/29/2014 1014   K 4.1 09/09/2013 0940   CL 105 09/09/2013 0940   CO2 25 08/29/2014 1014   CO2 24 09/09/2013 0940   GLUCOSE 91 08/29/2014 1014   GLUCOSE 88 09/09/2013 0940   BUN 10.6 08/29/2014 1014   BUN 15 09/09/2013 0940    CREATININE 0.8 08/29/2014 1014   CREATININE 0.71 09/09/2013 0940   CALCIUM 9.3 08/29/2014 1014   CALCIUM 9.0 09/09/2013 0940   PROT 6.5 08/29/2014 1014   PROT 6.7 08/15/2013 0515   ALBUMIN 3.6 08/29/2014 1014   ALBUMIN 3.0* 08/15/2013 0515   AST 10 08/29/2014 1014   AST 8 08/15/2013 0515   ALT 20 08/29/2014 1014   ALT 16 08/15/2013 0515   ALKPHOS 101 08/29/2014 1014   ALKPHOS 85 08/15/2013 0515   BILITOT 0.31 08/29/2014 1014   BILITOT 0.1* 08/15/2013 0515   GFRNONAA >90 09/09/2013 0940   GFRAA >90 09/09/2013 0940    No results found for: SPEP, UPEP  Lab Results  Component Value Date   WBC 4.2 08/29/2014   NEUTROABS 2.7 08/29/2014   HGB 12.9 08/29/2014   HCT 40.5 08/29/2014   MCV 83.3 08/29/2014   PLT 279 08/29/2014      Chemistry      Component Value Date/Time   NA 140 08/29/2014 1014   NA 138 09/09/2013 0940   K 4.1 08/29/2014 1014   K 4.1 09/09/2013 0940   CL 105 09/09/2013 0940   CO2 25 08/29/2014 1014   CO2 24 09/09/2013 0940   BUN 10.6 08/29/2014 1014   BUN 15 09/09/2013 0940   CREATININE 0.8 08/29/2014 1014   CREATININE 0.71 09/09/2013 0940      Component Value Date/Time   CALCIUM 9.3 08/29/2014 1014   CALCIUM 9.0 09/09/2013 0940   ALKPHOS 101 08/29/2014 1014   ALKPHOS 85 08/15/2013 0515   AST 10 08/29/2014 1014   AST 8 08/15/2013 0515   ALT 20 08/29/2014 1014   ALT 16 08/15/2013 0515   BILITOT 0.31 08/29/2014 1014   BILITOT 0.1* 08/15/2013 0515      ASSESSMENT & PLAN:  Hodgkin's lymphoma, nodular sclerosis We discussed treatment recommendation from hematology tumor board today. Overall consensus is to proceed with repeat imaging study in 8 weeks. I will present her next imaging study at the hematology tumor board and see her back afterwards. In the meantime, we will keep the port patent.  Fungal skin infection This is improved on antifungal cream.   Orders Placed This Encounter  Procedures  . NM PET Image Restag (PS) Skull Base To  Thigh    Standing Status: Future     Number of Occurrences:      Standing Expiration Date: 11/06/2015    Order Specific Question:  Reason for Exam (SYMPTOM  OR DIAGNOSIS REQUIRED)    Answer:  abnormal uptake in mediastinum on previous PET, need to follow to exclude recurrence    Order Specific Question:  Is the patient pregnant?    Answer:  No    Order Specific Question:  Preferred imaging location?    Answer:  [facility]   All questions were answered. The patient knows to call the clinic with any problems, questions or concerns. No barriers to learning was detected. I spent 15 minutes counseling the patient face to face. The total time spent in the appointment was 20 minutes and more than 50% was on counseling and  review of test results     [facility], Kamarius Buckbee, MD 09/06/2014 4:19 PM

## 2014-09-07 ENCOUNTER — Telehealth: Payer: Self-pay | Admitting: Hematology and Oncology

## 2014-09-07 NOTE — Telephone Encounter (Signed)
lvm for pt regardignt for Feb 2016 appt....mailed pt appt sched/avs adn letter

## 2014-09-12 ENCOUNTER — Telehealth: Payer: Self-pay | Admitting: *Deleted

## 2014-09-12 NOTE — Telephone Encounter (Signed)
I'm not an acne expert. Recommend PCP or dermatology review.

## 2014-09-12 NOTE — Telephone Encounter (Signed)
Informed Pt she needs to contact her PCP or Dermatologist for her acne as Dr. Alvy Bimler does not have expertise in this area.  She verbalized understanding.

## 2014-09-12 NOTE — Telephone Encounter (Signed)
Pt reports her acne has gotten worse over past few months in spite of using topical medication prescribed by Dr. Alvy Bimler.  She has a wedding to go to in January and asks if there is anything else such as oral medication she can try?  She has tried accutane in the past and it "ruined her skin."   So she is hoping there is some other medication for her acne but not quite as strong as accutane?   She has taken pictures on her cell phone if Dr. Alvy Bimler wants to see.

## 2014-09-12 NOTE — Telephone Encounter (Signed)
Pt left VM asking if she can "speak with Dr. Alvy Bimler personally."   She says she wants to ask her about a "specific problem" she has spoken to her about previously.

## 2014-09-13 ENCOUNTER — Telehealth: Payer: Self-pay | Admitting: Hematology and Oncology

## 2014-09-13 NOTE — Telephone Encounter (Signed)
The patient inquired about long-term antibiotic prescription for acne. Recommend PCP/dermatologist management. I am only comfortable to prescribe topical antibiotic cream only for short-term use.

## 2014-10-24 ENCOUNTER — Encounter (HOSPITAL_COMMUNITY)
Admission: RE | Admit: 2014-10-24 | Discharge: 2014-10-24 | Disposition: A | Discharge status: Home / Self Care | Payer: 59 | Source: Ambulatory Visit | Attending: Hematology and Oncology | Admitting: Hematology and Oncology

## 2014-10-24 ENCOUNTER — Ambulatory Visit (HOSPITAL_BASED_OUTPATIENT_CLINIC_OR_DEPARTMENT_OTHER): Payer: 59

## 2014-10-24 VITALS — BP 109/67 | HR 80 | Temp 98.4°F

## 2014-10-24 DIAGNOSIS — Z95828 Presence of other vascular implants and grafts: Secondary | ICD-10-CM

## 2014-10-24 DIAGNOSIS — C8110 Nodular sclerosis classical Hodgkin lymphoma, unspecified site: Secondary | ICD-10-CM | POA: Diagnosis not present

## 2014-10-24 DIAGNOSIS — Z452 Encounter for adjustment and management of vascular access device: Secondary | ICD-10-CM

## 2014-10-24 LAB — GLUCOSE, CAPILLARY: Glucose-Capillary: 87 mg/dL (ref 70–99)

## 2014-10-24 MED ORDER — HEPARIN SOD (PORK) LOCK FLUSH 100 UNIT/ML IV SOLN
500.0000 [IU] | Freq: Once | INTRAVENOUS | Status: AC
  Administered 2014-10-24: 500 [IU] via INTRAVENOUS
  Filled 2014-10-24: qty 5

## 2014-10-24 MED ORDER — SODIUM CHLORIDE 0.9 % IJ SOLN
10.0000 mL | INTRAMUSCULAR | Status: DC | PRN
  Administered 2014-10-24: 10 mL via INTRAVENOUS
  Filled 2014-10-24: qty 10

## 2014-10-24 MED ORDER — FLUDEOXYGLUCOSE F - 18 (FDG) INJECTION
13.4000 | Freq: Once | INTRAVENOUS | Status: AC | PRN
  Administered 2014-10-24: 13.4 via INTRAVENOUS

## 2014-10-24 NOTE — Patient Instructions (Signed)

## 2014-10-25 ENCOUNTER — Telehealth: Payer: Self-pay | Admitting: Hematology and Oncology

## 2014-10-25 ENCOUNTER — Ambulatory Visit (HOSPITAL_BASED_OUTPATIENT_CLINIC_OR_DEPARTMENT_OTHER): Payer: 59 | Admitting: Hematology and Oncology

## 2014-10-25 ENCOUNTER — Encounter: Payer: Self-pay | Admitting: Hematology and Oncology

## 2014-10-25 VITALS — BP 137/58 | HR 88 | Temp 97.9°F | Resp 18 | Ht 69.0 in | Wt 267.6 lb

## 2014-10-25 DIAGNOSIS — C8110 Nodular sclerosis classical Hodgkin lymphoma, unspecified site: Secondary | ICD-10-CM

## 2014-10-25 DIAGNOSIS — L708 Other acne: Secondary | ICD-10-CM

## 2014-10-25 NOTE — Progress Notes (Signed)
Miranda Villegas  Patient Care Team: Miranda Harvey, NP as PCP - General (Nurse Practitioner) Miranda Gala, Miranda [facility] as Consulting Physician (Otolaryngology) Miranda Nakayama, [facility] as Consulting Physician (Cardiothoracic Surgery)  SUMMARY OF ONCOLOGIC HISTORY: Oncology History   Hodgkin's lymphoma, nodular sclerosis   Primary site: Lymphoid Neoplasms   Staging method: AJCC 6th Edition   Clinical: Stage III signed by Heath Lark, Miranda Villegas on 12/29/2013  8:11 PM   Pathologic: Stage III signed by Heath Lark, Miranda Villegas on 12/29/2013  8:11 PM   Summary: Stage III       Hodgkin's lymphoma, nodular sclerosis   07/30/2013 Imaging CT scans show disease on both sides of the diaphragm with splenic involvement.   08/09/2013 Surgery Mediastinoscopy and biopsy confirmed classical Hodgkin lymphoma   08/14/2013 - 01/31/2014 Chemotherapy Patient was started on treatment with ABVD, completed 6 cycles of treatment.   11/22/2013 Imaging Repeat staging CT scans showed marked response to treatment.   03/01/2014 Imaging PET CT scan show complete remission.   08/29/2014 Imaging RepeaT PET CT scan showed regression in the size of LN but with mild increased hypermetabolic activity.   10/24/2014 Imaging Repeat PET/CT scan show persistent hypermetabolic activity in the mediastinal lymph node.    INTERVAL HISTORY: Please see below for problem oriented charting. She returns today to review test result. She denies any symptoms. No new infection. No new lymphadenopathy. She saw PCP and was treated with multiple acne treatment and it is improving  REVIEW OF SYSTEMS:   Constitutional: Denies fevers, chills or abnormal weight loss Eyes: Denies blurriness of vision Ears, nose, mouth, throat, and face: Denies mucositis or sore throat Respiratory: Denies cough, dyspnea or wheezes Cardiovascular: Denies palpitation, chest discomfort or lower extremity swelling Gastrointestinal:  Denies nausea, heartburn or change in  bowel habits Skin: Denies abnormal skin rashes Lymphatics: Denies new lymphadenopathy or easy bruising Neurological:Denies numbness, tingling or new weaknesses Behavioral/Psych: Mood is stable, no new changes  All other systems were reviewed with the patient and are negative.  I have reviewed the past medical history, past surgical history, social history and family history with the patient and they are unchanged from previous Villegas.  ALLERGIES:  is allergic to adhesive.  MEDICATIONS:  Current Outpatient Prescriptions  Medication Sig Dispense Refill  . ACZONE 5 % topical gel Apply 5 each topically 2 (two) times daily.  3  . adapalene (DIFFERIN) 0.1 % cream Apply 0.1 each topically at bedtime. To affected area  3  . ampicillin (PRINCIPEN) 500 MG capsule Take 500 mg by mouth 2 (two) times daily.  3  . ibuprofen (ADVIL,MOTRIN) 200 MG tablet Take 400-600 mg by mouth every 6 (six) hours as needed for fever, headache, mild pain, moderate pain or cramping.     . lidocaine-prilocaine (EMLA) cream Apply 1 application topically as needed. Apply to portacath site 1-2 hours prior to use. 30 g 3  . loratadine (CLARITIN) 10 MG tablet Take 10 mg by mouth daily.     . polyethylene glycol (MIRALAX / GLYCOLAX) packet Take 17 g by mouth daily as needed.     . triamcinolone cream (KENALOG) 0.1 % Apply 1 application topically daily as needed (for eczema flare ups).     No current facility-administered medications for this visit.    PHYSICAL EXAMINATION: ECOG PERFORMANCE STATUS: 0 - Asymptomatic  Filed Vitals:   10/25/14 1515  BP: 137/58  Pulse: 88  Temp: 97.9 F (36.6 C)  Resp: 18   Filed  Weights   10/25/14 1515  Weight: 267 lb 9.6 oz (121.383 kg)    GENERAL:alert, no distress and comfortable. She is morbidly obese SKIN: skin color, texture, turgor are normal, no rashes or significant lesions. Her acne has improved Musculoskeletal:no cyanosis of digits and no clubbing  NEURO: alert & oriented  x 3 with fluent speech, no focal motor/sensory deficits  LABORATORY DATA:  I have reviewed the data as listed    Component Value Date/Time   NA 140 08/29/2014 1014   NA 138 09/09/2013 0940   K 4.1 08/29/2014 1014   K 4.1 09/09/2013 0940   CL 105 09/09/2013 0940   CO2 25 08/29/2014 1014   CO2 24 09/09/2013 0940   GLUCOSE 91 08/29/2014 1014   GLUCOSE 88 09/09/2013 0940   BUN 10.6 08/29/2014 1014   BUN 15 09/09/2013 0940   CREATININE 0.8 08/29/2014 1014   CREATININE 0.71 09/09/2013 0940   CALCIUM 9.3 08/29/2014 1014   CALCIUM 9.0 09/09/2013 0940   PROT 6.5 08/29/2014 1014   PROT 6.7 08/15/2013 0515   ALBUMIN 3.6 08/29/2014 1014   ALBUMIN 3.0* 08/15/2013 0515   AST 10 08/29/2014 1014   AST 8 08/15/2013 0515   ALT 20 08/29/2014 1014   ALT 16 08/15/2013 0515   ALKPHOS 101 08/29/2014 1014   ALKPHOS 85 08/15/2013 0515   BILITOT 0.31 08/29/2014 1014   BILITOT 0.1* 08/15/2013 0515   GFRNONAA >90 09/09/2013 0940   GFRAA >90 09/09/2013 0940    No results found for: SPEP, UPEP  Lab Results  Component Value Date   WBC 4.2 08/29/2014   NEUTROABS 2.7 08/29/2014   HGB 12.9 08/29/2014   HCT 40.5 08/29/2014   MCV 83.3 08/29/2014   PLT 279 08/29/2014      Chemistry      Component Value Date/Time   NA 140 08/29/2014 1014   NA 138 09/09/2013 0940   K 4.1 08/29/2014 1014   K 4.1 09/09/2013 0940   CL 105 09/09/2013 0940   CO2 25 08/29/2014 1014   CO2 24 09/09/2013 0940   BUN 10.6 08/29/2014 1014   BUN 15 09/09/2013 0940   CREATININE 0.8 08/29/2014 1014   CREATININE 0.71 09/09/2013 0940      Component Value Date/Time   CALCIUM 9.3 08/29/2014 1014   CALCIUM 9.0 09/09/2013 0940   ALKPHOS 101 08/29/2014 1014   ALKPHOS 85 08/15/2013 0515   AST 10 08/29/2014 1014   AST 8 08/15/2013 0515   ALT 20 08/29/2014 1014   ALT 16 08/15/2013 0515   BILITOT 0.31 08/29/2014 1014   BILITOT 0.1* 08/15/2013 0515       RADIOGRAPHIC STUDIES: I have personally reviewed the radiological  images as listed and agreed with the findings in the report. Nm Pet Image Restag (ps) Skull Base To Thigh  10/24/2014   CLINICAL DATA:  Subsequent treatment strategy for Hodgkin lymphoma.  EXAM: NUCLEAR MEDICINE PET SKULL BASE TO THIGH  TECHNIQUE: 13.4 mCi F-18 FDG was injected intravenously. Full-ring PET imaging was performed from the skull base to thigh after the radiotracer. CT data was obtained and used for attenuation correction and anatomic localization.  FASTING BLOOD GLUCOSE:  Value: 87 mg/dl  COMPARISON:  08/29/2014 and CT chest abdomen pelvis 11/22/2013.  FINDINGS: NECK  No hypermetabolic lymph nodes in the neck. CT images show no acute findings.  CHEST  Partially calcified mediastinal lymph nodes measure up to 2.6 cm in the prevascular space (CT image 65), stable. Mild residual hypermetabolism is  seen within prevascular nodal tissue, measuring 1.8 cm with an SUV max of 6.7, stable. Mild focal hypermetabolism within the distal esophagus may be physiologic. There are no adjacent lymph nodes. CT images show a right IJ Port-A-Cath terminating in the right atrium. Heart size normal. No pericardial or pleural effusion given normal respiratory motion, lungs are grossly clear.  ABDOMEN/PELVIS  No abnormal hypermetabolism in the liver, adrenal glands, spleen or pancreas. No hypermetabolic lymph nodes. CT images show the liver, gallbladder, adrenal glands, kidneys, spleen, pancreas, stomach and bowel the grossly unremarkable. No free fluid.  SKELETON  No abnormal osseous hypermetabolism.  IMPRESSION: Stable mediastinal adenopathy with mild residual hypermetabolism. No evidence of disease progression. Findings are again consistent with Deauville score 4, uptake moderately higher than liver.   Electronically Signed   By: Lorin Picket M.D.   On: 10/24/2014 12:59     ASSESSMENT & PLAN:  Hodgkin's lymphoma, nodular sclerosis We have extensive discussion at that hematology tumor before. I discussed treatment  options with the patient and family. Discussed the potential difficulties of getting results from repeat excisional lymph node biopsy through mediastinoscopy. Alternatively, we can refer her to radiation oncologists to discuss about potential consolidation radiation therapy versus referral to tertiary center. After a lot of discussion, she is in agreement to proceed with radiation oncology consultation.  I plan to see her back after her radiation therapy is completed with plan for repeat PET/CT scan 6 to 8 weeks after radiation therapy.   Acne She is currently on multiple different agents to treat acne and this is improving.    Orders Placed This Encounter  Procedures  . Ambulatory referral to Radiation Oncology    Referral Priority:  Routine    Referral Type:  Consultation    Referral Reason:  Specialty Services Required    Referred to Provider:  Eppie Gibson, Miranda Villegas    Requested Specialty:  Radiation Oncology    Number of Visits Requested:  1   All questions were answered. The patient knows to call the clinic with any problems, questions or concerns. No barriers to learning was detected. I spent 25 minutes counseling the patient face to face. The total time spent in the appointment was 30 minutes and more than 50% was on counseling and review of test results     [facility], [place], Miranda Villegas 10/25/2014 3:51 PM

## 2014-10-25 NOTE — Assessment & Plan Note (Signed)
She is currently on multiple different agents to treat acne and this is improving.

## 2014-10-25 NOTE — Telephone Encounter (Signed)
per 2/2 pof no f/u w/NG. pt given avs report and appt for Dr. Isidore Moos 2/10.

## 2014-10-25 NOTE — Assessment & Plan Note (Signed)
We have extensive discussion at that hematology tumor before. I discussed treatment options with the patient and family. Discussed the potential difficulties of getting results from repeat excisional lymph node biopsy through mediastinoscopy. Alternatively, we can refer her to radiation oncologists to discuss about potential consolidation radiation therapy versus referral to tertiary center. After a lot of discussion, she is in agreement to proceed with radiation oncology consultation.  I plan to see her back after her radiation therapy is completed with plan for repeat PET/CT scan 6 to 8 weeks after radiation therapy.

## 2014-10-26 ENCOUNTER — Telehealth: Payer: Self-pay | Admitting: *Deleted

## 2014-10-26 ENCOUNTER — Other Ambulatory Visit: Payer: Self-pay | Admitting: Hematology and Oncology

## 2014-10-26 DIAGNOSIS — C8110 Nodular sclerosis classical Hodgkin lymphoma, unspecified site: Secondary | ICD-10-CM

## 2014-10-26 NOTE — Telephone Encounter (Signed)
-----   Message from Heath Lark, MD sent at 10/26/2014  7:42 AM EST ----- Regarding: echo Miranda Villegas, can you call patient and tell her I have discussed with Dr. Isidore Moos who recommended repeat baseline ECHO prior to XRT. It is ordered for next week.

## 2014-10-26 NOTE — Telephone Encounter (Signed)
Left message with note below 

## 2014-11-01 NOTE — Progress Notes (Signed)
Radiation Oncology         [id number]) [phone] ________________________________  Initial outpatient Consultation  Name: Miranda Villegas MRN: [id number]  Date: 11/02/2014  DOB: 12/08/1990  WS:[facility], Sherrill Raring, NP  Heath Lark, MD   REFERRING PHYSICIAN: Heath Lark, MD  DIAGNOSIS:    ICD-9-CM ICD-10-CM   1. Hodgkin's lymphoma, nodular sclerosis 201.50 C81.10 Ambulatory referral to Radiation Oncology     Ambulatory referral to Hematology / Oncology    Hodgkin's lymphoma, nodular sclerosis. Clinical: Stage IIIB   HISTORY OF PRESENT ILLNESS::Miranda Villegas is a 24 y.o. female who presented with 40 lb unintentional weight loss, drenching night sweats, and neck swelling in 2014.  Initial CT imaging indicated lymphadenopathy in the neck, chest, and splenic lesions.  The mediastinal disease was bulky, >10cm.    Biopsy showed: Diagnosis 1. Mediastinum, biopsy, Anterior - ATYPICAL LYMPHOPROLIFERATIVE PROCESS CONSISTENT WITH CLASSICAL HODGKIN'S LYMPHOMA. 2. Lymph node, biopsy, Cervical - CLASSICAL HODGKIN'S LYMPHOMA - SEE ONCOLOGY TABLE 3. Mediastinum, biopsy, Anterior mediastinal #2 - ATYPICAL LYMPHOPROLIFERATIVE PROCESS CONSISTENT WITH CLASSICAL HODGKIN'S LYMPHOMA. Microscopic Comment 2. LYMPHOMA Histologic type: Classical Hodgkin's lymphoma, nodular sclerosis type Grade (if applicable): N/A Flow cytometry: Non-diagnostic material Immunohistochemical stains: See comment. Touch preps/imprints: Scanty material with occasional large atypical lymphoid cells Comments: The specimens from the mediastinum (specimens 1 and 3) show extensive crush artifacts that limit overall interpretation of the cytomorphology. The tissue is relatively more preserved in the cervical lymph node biopsy specimen and hence much of the cytomorphologic evaluation and immunohistochemical stains were performed on this tissue (specimen 2). The sections primarily show a primarily nodular lymphoproliferative process with  associated dense fibrosis surrounding the nodules. The nodular tissue contains a polymorphous population of small lymphocytes, histiocytes, eosinophils and variable numbers of large atypical mononuclear and multilobated lymphoid cells with variably prominent nucleoli the features of which are characteristic of Reed-Sternberg cells and variants. To further evaluate this process, immunohistochemical stains for CD30, CD15, LCA, CD20, CD79a, CD3, CD43, CD10 were performed on block 2A. CD30 was also performed on blocks 1A and 3C. The large atypical lymphoid cells are positive for CD30 and CD15 and negative for LCA, CD3, CD20, CD79a, CD43 and CD10. Evaluation of specimen 1 and 3 for CD30 also shows scattering of CD30 positive large lymphoid cells. The small lymphoid cells in the background show predominance of T cells as seen with CD3 and CD43. There is a minor component of B cells as seen with CD20 and CD79a. The overall histologic and phenotypic features are consistent with classical Hodgkin's lymphoma which is best classified as nodular sclerosis subtype. (BNS:kh 08-12-13)  Per notes from her initial oncology, Dr Beryle Beams, BM biopsy was not done.  Due to SVC syndrome, she started ABVD quickly, prior to PET scan. PET in Dec 2014 soon after initiation of ABVD did not show hypermetabolic activity in the abdomen but she was still considered STAGE III by medical oncology given the splenic CT lesions.  In June 2015 PET showed was was felt to be a complete metabolic response.  Repeat surveillance imaging on Dec 2015 showed that the mediastinal mass was a bit smaller but with more hypermetabolic activity.  Tumor board decision was to re scan in ~2 mo, and on 10-24-14 PET showed Deauville 4 hypermetabolic activity in the residual mediastinal mass.  Dr Alvy Bimler discussed the options of repeat biopsy (which would likely require mediastinoscopy, referral to a university setting, and referral to myself in radiation  oncology. Family and pt opted to see me.  A baseline  ECHO has been ordered by med/onc in case of future RT.  She has gained her weight back, no longer having night sweats. Denies fevers.  She works as a Agricultural engineer.   Below is Dr Calton Dach outline of her history:   PREVIOUS RADIATION THERAPY: No  PAST MEDICAL HISTORY:  has a past medical history of Mass in neck; Anxiety; Depression; hodgkins lymphoma (08/12/13); and Fungal skin infection (09/01/2014).    PAST SURGICAL HISTORY: Past Surgical History  Procedure Laterality Date  . Tooth extraction    . Lymph node biopsy  08/09/13    medial neck area - lymphoma dx.  . Mediastinoscopy N/A 08/09/2013    Procedure: MEDIASTINOSCOPY;  Surgeon: Melrose Nakayama, MD;  Location[facility] OR;  Service: Thoracic;  Laterality: N/A;    FAMILY HISTORY: family history includes Alzheimer's disease in an other family member; Autoimmune disease in an other family member; Breast cancer in an other family member; Hearing loss in an other family member; Heart disease in an other family member; Migraines in her mother; Osteoarthritis in her mother; Parkinsonism in an other family member; Transient ischemic attack in an other family member.  SOCIAL HISTORY:  reports that she has never smoked. She has never used smokeless tobacco. She reports that she does not drink alcohol or use illicit drugs.  ALLERGIES: Adhesive  MEDICATIONS:  Current Outpatient Prescriptions  Medication Sig Dispense Refill  . ACZONE 5 % topical gel Apply 5 each topically 2 (two) times daily.  3  . adapalene (DIFFERIN) 0.1 % cream Apply 0.1 each topically at bedtime. To affected area  3  . ampicillin (PRINCIPEN) 500 MG capsule Take 500 mg by mouth 2 (two) times daily.  3  . ibuprofen (ADVIL,MOTRIN) 200 MG tablet Take 400-600 mg by mouth every 6 (six) hours as needed for fever, headache, mild pain, moderate pain or cramping.     . lidocaine-prilocaine (EMLA) cream Apply 1  application topically as needed. Apply to portacath site 1-2 hours prior to use. 30 g 3  . loratadine (CLARITIN) 10 MG tablet Take 10 mg by mouth daily.     . polyethylene glycol (MIRALAX / GLYCOLAX) packet Take 17 g by mouth daily as needed.     . triamcinolone cream (KENALOG) 0.1 % Apply 1 application topically daily as needed (for eczema flare ups).     No current facility-administered medications for this encounter.    REVIEW OF SYSTEMS:  Notable for that above.   PHYSICAL EXAM:  height is 5\' 10"  (1.778 m) and weight is 270 lb 1.6 oz (122.517 kg). Her blood pressure is 115/61 and her pulse is 84. Her respiration is 16 and oxygen saturation is 100%.   General: Alert and oriented, in no acute distress HEENT: Head is normocephalic. Extraocular movements are intact. Oropharynx is clear. Neck: Neck is supple, no palpable cervical or supraclavicular lymphadenopathy. Heart: Regular in rate and rhythm with no murmurs, rubs, or gallops. Chest: Clear to auscultation bilaterally, with no rhonchi, wheezes, or rales. Abdomen: Soft, nontender, nondistended, with no rigidity or guarding. Extremities: No cyanosis or edema. Lymphatics: see Neck Exam Skin: No concerning lesions. Musculoskeletal: symmetric strength and muscle tone throughout. Neurologic: Cranial nerves II through XII are grossly intact. No obvious focalities. Speech is fluent. Coordination is intact. Psychiatric: Judgment and insight are intact. Affect is appropriate.   ECOG = 0  0 - Asymptomatic (Fully active, able to carry on all predisease activities without restriction)  1 - Symptomatic but completely ambulatory (Restricted in  physically strenuous activity but ambulatory and able to carry out work of a light or sedentary nature. For example, light housework, office work)  2 - Symptomatic, <50% in bed during the day (Ambulatory and capable of all self care but unable to carry out any work activities. Up and about more than 50% of  waking hours)  3 - Symptomatic, >50% in bed, but not bedbound (Capable of only limited self-care, confined to bed or chair 50% or more of waking hours)  4 - Bedbound (Completely disabled. Cannot carry on any self-care. Totally confined to bed or chair)  5 - Death   Eustace Pen MM, Creech RH, Tormey DC, et al. [phone]). "Toxicity and response criteria of the [facility]". [place] Oncol. 5 (6): 649-55   LABORATORY DATA:  Lab Results  Component Value Date   WBC 4.2 08/29/2014   HGB 12.9 08/29/2014   HCT 40.5 08/29/2014   MCV 83.3 08/29/2014   PLT 279 08/29/2014   CMP     Component Value Date/Time   NA 140 08/29/2014 1014   NA 138 09/09/2013 0940   K 4.1 08/29/2014 1014   K 4.1 09/09/2013 0940   CL 105 09/09/2013 0940   CO2 25 08/29/2014 1014   CO2 24 09/09/2013 0940   GLUCOSE 91 08/29/2014 1014   GLUCOSE 88 09/09/2013 0940   BUN 10.6 08/29/2014 1014   BUN 15 09/09/2013 0940   CREATININE 0.8 08/29/2014 1014   CREATININE 0.71 09/09/2013 0940   CALCIUM 9.3 08/29/2014 1014   CALCIUM 9.0 09/09/2013 0940   PROT 6.5 08/29/2014 1014   PROT 6.7 08/15/2013 0515   ALBUMIN 3.6 08/29/2014 1014   ALBUMIN 3.0* 08/15/2013 0515   AST 10 08/29/2014 1014   AST 8 08/15/2013 0515   ALT 20 08/29/2014 1014   ALT 16 08/15/2013 0515   ALKPHOS 101 08/29/2014 1014   ALKPHOS 85 08/15/2013 0515   BILITOT 0.31 08/29/2014 1014   BILITOT 0.1* 08/15/2013 0515   GFRNONAA >90 09/09/2013 0940   GFRAA >90 09/09/2013 0940         RADIOGRAPHY: Nm Pet Image Restag (ps) Skull Base To Thigh  10/24/2014   CLINICAL DATA:  Subsequent treatment strategy for Hodgkin lymphoma.  EXAM: NUCLEAR MEDICINE PET SKULL BASE TO THIGH  TECHNIQUE: 13.4 mCi F-18 FDG was injected intravenously. Full-ring PET imaging was performed from the skull base to thigh after the radiotracer. CT data was obtained and used for attenuation correction and anatomic localization.  FASTING BLOOD GLUCOSE:  Value: 87 mg/dl   COMPARISON:  08/29/2014 and CT chest abdomen pelvis 11/22/2013.  FINDINGS: NECK  No hypermetabolic lymph nodes in the neck. CT images show no acute findings.  CHEST  Partially calcified mediastinal lymph nodes measure up to 2.6 cm in the prevascular space (CT image 65), stable. Mild residual hypermetabolism is seen within prevascular nodal tissue, measuring 1.8 cm with an SUV max of 6.7, stable. Mild focal hypermetabolism within the distal esophagus may be physiologic. There are no adjacent lymph nodes. CT images show a right IJ Port-A-Cath terminating in the right atrium. Heart size normal. No pericardial or pleural effusion given normal respiratory motion, lungs are grossly clear.  ABDOMEN/PELVIS  No abnormal hypermetabolism in the liver, adrenal glands, spleen or pancreas. No hypermetabolic lymph nodes. CT images show the liver, gallbladder, adrenal glands, kidneys, spleen, pancreas, stomach and bowel the grossly unremarkable. No free fluid.  SKELETON  No abnormal osseous hypermetabolism.  IMPRESSION: Stable mediastinal adenopathy with mild  residual hypermetabolism. No evidence of disease progression. Findings are again consistent with Deauville score 4, uptake moderately higher than liver.   Electronically Signed   By: Lorin Picket M.D.   On: 10/24/2014 12:59      IMPRESSION/PLAN:  Per NCCN guidelines, for unfavorable STAGE IIB disease, the standard of care would be ISRT at this juncture, given a Deauville 4 status.  Assuming she in fact had splenic disease at diagnosis (lesions were suspicious on CT) and is STAGE IIIB, The standard NCCN treatment options are 1) ISRT to PET+ sites OR 2) Biopsy.  Biopsy, per Dr Grant Fontana investigation, would require repeat mediastinoscopy.  Dr Alvy Bimler discussed this option with the patient, as well as referral to a tertiary center. The option of proceeding with radiation oncology referral close to home was the patient's preference.  I had a lengthy discussion with the  patient and her parents.  I explained that if she has refractory disease, it MAY be in her best interest to have salvage systemic therapy. This could include stem cell rescue.  I explained that it is not urgent to start any treatment immediately, and recommended she at least meet with specialists in a University setting to discuss her various options.  There is not a clear-cut single option for her at this juncture. I'd like her to weight the risks and benefits of further types of therapy and explore a second opinion.  She feels anxious to complete her therapy ASAP, but understands the potential value of going to a University for a second opinion.  After discussion, they would like to go to [facility] for same-day heme/onc and rad/onc consultations.   We'll try to coordinate that.  If radiotherapy is recommended, I explained that we have excellent technology to deliver it close to home (including [employer]).  They have my contact information and know to call me if/when they are ready to see me back in my clinic.  __________________________________________   Eppie Gibson, MD

## 2014-11-01 NOTE — Progress Notes (Signed)
Lymphoma Location(s) / Histology: stage II Hodgkin's Lymphoma, nodular sclerosis type, involving the mediastinum, right lower cervical and left axillary lymph nodes   Miranda Villegas presented 14 months ago with symptoms of: For few months the patient noticed enlarged lymph node in the left axilla that was waxing and waning. She was treated with 2 courses of antibiotics. In October of 2014, and axillary lymph nodes was getting bigger and the patient also felt a lump in the right neck area. She was seen by her primary care physician and chest x-ray showed mediastinal lymphadenopathy  Biopsies of  (if applicable) revealed:  78/67/54 Diagnosis 1. Mediastinum, biopsy, Anterior - ATYPICAL LYMPHOPROLIFERATIVE PROCESS CONSISTENT WITH CLASSICAL HODGKIN'S LYMPHOMA. 2. Lymph node, biopsy, Cervical - CLASSICAL HODGKIN'S LYMPHOMA - SEE ONCOLOGY TABLE 3. Mediastinum, biopsy, Anterior mediastinal #2 - ATYPICAL LYMPHOPROLIFERATIVE PROCESS CONSISTENT WITH CLASSICAL HODGKIN'S LYMPHOMA.  Past/Anticipated interventions by medical oncology, if any: Dr Alvy Bimler:  08/14/2013 - 01/31/2014 Chemotherapy Patient was started on treatment with ABVD, completed 6 cycles of treatment.   Weight changes, if any, over the past 6 months: 50 lb weight gain since Nov 2014  Recurrent fevers, or drenching night sweats, if any:   SAFETY ISSUES:  Prior radiation? no  Pacemaker/ICD? no  Possible current pregnancy?   Is the patient on methotrexate? no  Current Complaints / other details:  Dr Calton Dach 10/25/14 note: After a lot of discussion, she is in agreement to proceed with radiation oncology consultation. I plan to see her back after her radiation therapy is completed with plan for repeat PET/CT scan 6 to 8 weeks after radiation therapy.

## 2014-11-02 ENCOUNTER — Encounter: Payer: Self-pay | Admitting: Radiation Oncology

## 2014-11-02 ENCOUNTER — Ambulatory Visit
Admission: RE | Admit: 2014-11-02 | Discharge: 2014-11-02 | Disposition: A | Discharge status: Home / Self Care | Payer: 59 | Source: Ambulatory Visit | Attending: Radiation Oncology | Admitting: Radiation Oncology

## 2014-11-02 VITALS — BP 115/61 | HR 84 | Resp 16 | Ht 70.0 in | Wt 270.1 lb

## 2014-11-02 DIAGNOSIS — C8110 Nodular sclerosis classical Hodgkin lymphoma, unspecified site: Secondary | ICD-10-CM | POA: Diagnosis present

## 2014-11-02 DIAGNOSIS — Z792 Long term (current) use of antibiotics: Secondary | ICD-10-CM | POA: Insufficient documentation

## 2014-11-02 DIAGNOSIS — C8190 Hodgkin lymphoma, unspecified, unspecified site: Secondary | ICD-10-CM

## 2014-11-02 NOTE — Progress Notes (Signed)
Patient without complaints. Denies headache, dizziness, nausea, vomiting, or diarrhea. Denies cough, SOB, or chest pain. Reports Miranda Villegas finished chemotherapy on Jan 31, 2014. A routine scan was done December 2015 which raised concern for area in mediastinum. Scan was repeated last week. Patient was given the option of a repeat biopsy which Miranda Villegas didn't want, going to see a specialist at [facility] or radiation. Patient chose radiation therapy and is here for a consult. Denies history of radiation therapy. Highly sensitive to adhesive in most all tape.

## 2014-11-02 NOTE — Progress Notes (Signed)
See progress note under physician encounter. 

## 2014-11-03 ENCOUNTER — Telehealth: Payer: Self-pay | Admitting: *Deleted

## 2014-11-03 NOTE — Telephone Encounter (Signed)
Father states they met w/ Dr. Isidore Moos and were very surprised that she suggested pt be evaluated for Stem Cell Transplant.   They don't recall Dr. Alvy Bimler mentioning possibility of a transplant.  He says Dr. Isidore Moos is also referring pt to [facility] to get a "second opinion from a Specialist" regarding Radiation Treatment.  Says Dr. Isidore Moos wants second opinion before proceeding w/ Radiation.  He is under impression that Dr. Isidore Moos is also referring pt to [facility] for stem cell transplant consult as well. Marland Kitchen  He says they have a friend at [facility] who knows the "Hodgkins Specialist" there.  He thinks pt wants to be referred to [facility] instead of [facility].  He is going to call us back with the name and phone number of Specialist at [facility].   He would really like Dr. Calton Dach opinion and input about referral and especially regarding stem celll transplant before pt goes anywhere.

## 2014-11-07 ENCOUNTER — Telehealth: Payer: Self-pay | Admitting: *Deleted

## 2014-11-07 ENCOUNTER — Telehealth: Payer: Self-pay | Admitting: Hematology and Oncology

## 2014-11-07 NOTE — Telephone Encounter (Signed)
Faxed pt medical records to [facility]. Slides and scans will be fedex'ed °

## 2014-11-07 NOTE — Telephone Encounter (Signed)
I would recommend she proceed with second opinion first. BMT last resort

## 2014-11-07 NOTE — Telephone Encounter (Signed)
Father reports pt has decided to go to [facility] instead of [facility] for BMT consult and Rad Onc second opinion.   Pt has appt w/ Dr. Dellis Filbert at [facility] on 11/29/14.  Dr. Dellis Filbert will refer pt to Rad Onc after he sees pt on 3/8.  ( I will cancel pt's appts at [place] this week.) Father would like to know Dr. Calton Dach opinion about BMT.  He says he doesn't think she ever mentioned this option to pt or family.   He also wants to make sure it is ok to wait until 3/8 for appt at [facility].   He says that is the soonest Dr. Dellis Filbert could see pt.

## 2014-11-07 NOTE — Telephone Encounter (Signed)
Duplicate note opened

## 2014-11-07 NOTE — Telephone Encounter (Signed)
PT.'S FATHER WANTED TO LEAVE A VOICE MAIL FOR CAMEO [place],RN. ALSO GAVE CAMEO PT.'S FATHER'S PHONE NUMBER.

## 2014-11-07 NOTE — Telephone Encounter (Signed)
Informed pt's father of Dr. Alvy Bimler message below.  He verbalized understanding.  I left VM for [facility] of Hematology Malignancies [phone].  Pt needs to cancel her appts there and is going to [place] instead.

## 2014-11-30 ENCOUNTER — Telehealth: Payer: Self-pay | Admitting: Hematology and Oncology

## 2014-11-30 NOTE — Telephone Encounter (Signed)
PRINTED AND FAXED RECORDS TO: [facility], DR. Dellis Filbert FAX NUMBER: [phone]

## 2014-12-05 ENCOUNTER — Telehealth: Payer: Self-pay | Admitting: *Deleted

## 2014-12-05 NOTE — Telephone Encounter (Signed)
Mr. Eilleen Kempf called to say Venia has an appointment with a reproductive specialist, Dr. Posey Pronto, and a thoracic surgeon tomorrow at [facility].  They are planning on obtaining another biopsy.  He want to keep Dr. Alvy Bimler in the loop.

## 2014-12-05 NOTE — Telephone Encounter (Signed)
Thanks

## 2014-12-08 ENCOUNTER — Telehealth: Payer: Self-pay | Admitting: *Deleted

## 2014-12-08 NOTE — Telephone Encounter (Signed)
Father reports pt is scheduled for biopsy by Thoracic Surgeon at [facility],  Dr. Lianne Moris, on  Tues 3/22.   She will stay in hospital for a few days.  If biopsy is positive for Hodgkin's Lymphoma then Dr. Dellis Filbert wants to proceed w/ stem cell transplant.  They are also working out some fertility preservation for pt and the cost was $12,000 and they have got it down to $6,000.    Father says they were told Dr. Alvy Bimler can help take care of pt post transplant.   Informed father we can work this out with Dr. Dellis Filbert and Dr. Alvy Bimler.   Please let us know when he finds out biopsy results.  He verbalized understanding.

## 2014-12-14 ENCOUNTER — Telehealth: Payer: Self-pay | Admitting: *Deleted

## 2014-12-14 NOTE — Telephone Encounter (Signed)
Father left VM wants Dr. Alvy Bimler to know pt had her biopsy yesterday and Dr. Lianne Moris said it wasn't what he expected.  He told family pt had a "very angry and inflamed sinus gland."   Father says it was sent for biopsy and they are praying it is not cancer.

## 2014-12-16 ENCOUNTER — Telehealth: Payer: Self-pay | Admitting: *Deleted

## 2014-12-16 NOTE — Telephone Encounter (Signed)
VM left by pt's father states pt's biopsy showed no Malignancy and pt is "Cancer Free."

## 2014-12-19 ENCOUNTER — Telehealth: Payer: Self-pay | Admitting: *Deleted

## 2014-12-19 ENCOUNTER — Other Ambulatory Visit: Payer: Self-pay | Admitting: Hematology and Oncology

## 2014-12-19 NOTE — Telephone Encounter (Signed)
Please tell her father I appreciate the update and awaiting final recommendations from Dr. Dellis Filbert

## 2014-12-19 NOTE — Telephone Encounter (Signed)
Left VM for pt's father informing him Dr. Alvy Bimler appreciates the updates and is just waiting on recommendations from Dr. Dellis Filbert.

## 2014-12-20 ENCOUNTER — Telehealth: Payer: Self-pay | Admitting: Hematology and Oncology

## 2014-12-20 ENCOUNTER — Ambulatory Visit (HOSPITAL_BASED_OUTPATIENT_CLINIC_OR_DEPARTMENT_OTHER): Payer: 59

## 2014-12-20 VITALS — BP 115/69 | HR 105 | Temp 98.7°F

## 2014-12-20 DIAGNOSIS — C8110 Nodular sclerosis classical Hodgkin lymphoma, unspecified site: Secondary | ICD-10-CM | POA: Diagnosis not present

## 2014-12-20 DIAGNOSIS — Z452 Encounter for adjustment and management of vascular access device: Secondary | ICD-10-CM

## 2014-12-20 DIAGNOSIS — Z95828 Presence of other vascular implants and grafts: Secondary | ICD-10-CM

## 2014-12-20 MED ORDER — HEPARIN SOD (PORK) LOCK FLUSH 100 UNIT/ML IV SOLN
500.0000 [IU] | Freq: Once | INTRAVENOUS | Status: AC
  Administered 2014-12-20: 500 [IU] via INTRAVENOUS
  Filled 2014-12-20: qty 5

## 2014-12-20 MED ORDER — SODIUM CHLORIDE 0.9 % IJ SOLN
10.0000 mL | INTRAMUSCULAR | Status: DC | PRN
  Administered 2014-12-20: 10 mL via INTRAVENOUS
  Filled 2014-12-20: qty 10

## 2014-12-20 NOTE — Patient Instructions (Signed)

## 2014-12-20 NOTE — Telephone Encounter (Signed)
Pt came in needed her port flushed..... KJ

## 2014-12-27 ENCOUNTER — Telehealth: Payer: Self-pay | Admitting: Hematology and Oncology

## 2014-12-27 ENCOUNTER — Other Ambulatory Visit: Payer: Self-pay | Admitting: Hematology and Oncology

## 2014-12-27 DIAGNOSIS — C8110 Nodular sclerosis classical Hodgkin lymphoma, unspecified site: Secondary | ICD-10-CM

## 2014-12-27 NOTE — Telephone Encounter (Signed)
s.w. pt and advised on June appt....pt ok and aware °

## 2015-01-23 ENCOUNTER — Telehealth: Payer: Self-pay | Admitting: *Deleted

## 2015-01-23 NOTE — Telephone Encounter (Signed)
I will place new POF. Before I do so, can I confirm 315 pm on 6/21 to see me ok?

## 2015-01-23 NOTE — Telephone Encounter (Signed)
PT. HAS TO BE AT WORK ON 03/06/15 AND 03/07/15. SHE WOULD LIKE TO CHANGE HER APPOINTMENTS FOR THE PET SCAN AND FOLLOW UP VISIT TO THE WEEK OF June 6 OR THE WEEK OF June 20.

## 2015-01-24 ENCOUNTER — Other Ambulatory Visit: Payer: Self-pay | Admitting: Hematology and Oncology

## 2015-01-24 NOTE — Telephone Encounter (Signed)
I placed new POF to move her appt for labs and scan 6/20 and see me 6/21 I'm not sure who is responsible to call radiology department to move

## 2015-01-24 NOTE — Telephone Encounter (Signed)
SPOKE TO Miranda Villegas. SHE CAN SEE [name] ON 03/14/15 AT 3:15PM.

## 2015-03-06 ENCOUNTER — Ambulatory Visit (HOSPITAL_COMMUNITY): Payer: 59

## 2015-03-06 ENCOUNTER — Other Ambulatory Visit: Payer: 59

## 2015-03-07 ENCOUNTER — Ambulatory Visit: Payer: 59 | Admitting: Hematology and Oncology

## 2015-03-13 ENCOUNTER — Other Ambulatory Visit (HOSPITAL_BASED_OUTPATIENT_CLINIC_OR_DEPARTMENT_OTHER): Payer: 59

## 2015-03-13 ENCOUNTER — Encounter (HOSPITAL_COMMUNITY)
Admission: RE | Admit: 2015-03-13 | Discharge: 2015-03-13 | Disposition: A | Discharge status: Home / Self Care | Payer: 59 | Source: Ambulatory Visit | Attending: Hematology and Oncology | Admitting: Hematology and Oncology

## 2015-03-13 ENCOUNTER — Ambulatory Visit (HOSPITAL_BASED_OUTPATIENT_CLINIC_OR_DEPARTMENT_OTHER): Payer: 59

## 2015-03-13 DIAGNOSIS — C8110 Nodular sclerosis classical Hodgkin lymphoma, unspecified site: Secondary | ICD-10-CM

## 2015-03-13 DIAGNOSIS — Z452 Encounter for adjustment and management of vascular access device: Secondary | ICD-10-CM | POA: Diagnosis not present

## 2015-03-13 DIAGNOSIS — Z95828 Presence of other vascular implants and grafts: Secondary | ICD-10-CM

## 2015-03-13 LAB — CBC WITH DIFFERENTIAL/PLATELET
BASO%: 0.3 % (ref 0.0–2.0)
Basophils Absolute: 0 10*3/uL (ref 0.0–0.1)
EOS%: 0.7 % (ref 0.0–7.0)
Eosinophils Absolute: 0 10*3/uL (ref 0.0–0.5)
HCT: 38.9 % (ref 34.8–46.6)
HGB: 12.5 g/dL (ref 11.6–15.9)
LYMPH%: 24.3 % (ref 14.0–49.7)
MCH: 27 pg (ref 25.1–34.0)
MCHC: 32.1 g/dL (ref 31.5–36.0)
MCV: 84 fL (ref 79.5–101.0)
MONO#: 0.4 10*3/uL (ref 0.1–0.9)
MONO%: 6.3 % (ref 0.0–14.0)
NEUT#: 4 10*3/uL (ref 1.5–6.5)
NEUT%: 68.4 % (ref 38.4–76.8)
Platelets: 240 10*3/uL (ref 145–400)
RBC: 4.63 10*6/uL (ref 3.70–5.45)
RDW: 13.3 % (ref 11.2–14.5)
WBC: 5.9 10*3/uL (ref 3.9–10.3)
lymph#: 1.4 10*3/uL (ref 0.9–3.3)

## 2015-03-13 LAB — COMPREHENSIVE METABOLIC PANEL (CC13)
ALT: 14 U/L (ref 0–55)
AST: 10 U/L (ref 5–34)
Albumin: 3.8 g/dL (ref 3.5–5.0)
Alkaline Phosphatase: 89 U/L (ref 40–150)
Anion Gap: 7 mEq/L (ref 3–11)
BUN: 13.7 mg/dL (ref 7.0–26.0)
CO2: 25 mEq/L (ref 22–29)
CREATININE: 0.8 mg/dL (ref 0.6–1.1)
Calcium: 8.8 mg/dL (ref 8.4–10.4)
Chloride: 110 mEq/L — ABNORMAL HIGH (ref 98–109)
EGFR: >90 mL/min/{1.73_m2} (ref >90)
Glucose: 94 mg/dl (ref 70–140)
Potassium: 4 mEq/L (ref 3.5–5.1)
Sodium: 141 mEq/L (ref 136–145)
Total Bilirubin: 0.38 mg/dL (ref 0.20–1.20)
Total Protein: 6.6 g/dL (ref 6.4–8.3)

## 2015-03-13 LAB — GLUCOSE, CAPILLARY: Glucose-Capillary: 92 mg/dL (ref 65–99)

## 2015-03-13 MED ORDER — FLUDEOXYGLUCOSE F - 18 (FDG) INJECTION
13.2000 | Freq: Once | INTRAVENOUS | Status: AC | PRN
  Administered 2015-03-13: 13.2 via INTRAVENOUS

## 2015-03-13 MED ORDER — HEPARIN SOD (PORK) LOCK FLUSH 100 UNIT/ML IV SOLN
500.0000 [IU] | Freq: Once | INTRAVENOUS | Status: DC
  Filled 2015-03-13: qty 5

## 2015-03-13 MED ORDER — SODIUM CHLORIDE 0.9 % IJ SOLN
10.0000 mL | INTRAMUSCULAR | Status: DC | PRN
  Administered 2015-03-13: 10 mL via INTRAVENOUS
  Filled 2015-03-13: qty 10

## 2015-03-13 NOTE — Patient Instructions (Signed)

## 2015-03-14 ENCOUNTER — Encounter: Payer: Self-pay | Admitting: Hematology and Oncology

## 2015-03-14 ENCOUNTER — Ambulatory Visit (HOSPITAL_BASED_OUTPATIENT_CLINIC_OR_DEPARTMENT_OTHER): Payer: 59 | Admitting: Hematology and Oncology

## 2015-03-14 ENCOUNTER — Other Ambulatory Visit: Payer: Self-pay | Admitting: Hematology and Oncology

## 2015-03-14 ENCOUNTER — Telehealth: Payer: Self-pay | Admitting: Hematology and Oncology

## 2015-03-14 VITALS — BP 119/70 | HR 67 | Temp 98.0°F | Resp 18 | Ht 70.0 in | Wt 261.6 lb

## 2015-03-14 DIAGNOSIS — C8110 Nodular sclerosis classical Hodgkin lymphoma, unspecified site: Secondary | ICD-10-CM | POA: Diagnosis not present

## 2015-03-14 NOTE — Assessment & Plan Note (Signed)
I have reviewed her case at that hematology tumor board today and her oncologist at [place]. 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 Recent surgical resection in March 2016 at [facility] showed benign pathology. She is asymptomatic. I recommend removing of her port and see her back in 6 months with repeat history, physical examination, blood work and imaging study.

## 2015-03-14 NOTE — Progress Notes (Signed)
[facility] OFF[facility] PROGRESS NOTE  Patient Care Team: Berkley Harvey, NP as PCP - General (Nurse Practitioner) Izora Gala, MD as Consulting Physician (Otolaryngology) Melrose Nakayama, MD as Consulting Physician (Cardiothoracic Surgery) Clydene Fake, MD as Consulting Physician (Hematology and Oncology) Lindajo Royal Zigmund Gottron., MD as Consulting Physician (Cardiothoracic Surgery)  SUMMARY OF ONCOLOGIC HISTORY: Oncology History   Hodgkin's lymphoma, nodular sclerosis   Primary site: Lymphoid Neoplasms   Staging method: AJCC 6th Edition   Clinical: Stage III signed by Heath Lark, MD on 12/29/2013  8:11 PM   Pathologic: Stage III signed by Heath Lark, MD on 12/29/2013  8:11 PM   Summary: Stage III       Hodgkin's lymphoma, nodular sclerosis   07/30/2013 Imaging CT scans show disease on both sides of the diaphragm with splenic involvement.   08/09/2013 Surgery Mediastinoscopy and biopsy confirmed classical Hodgkin lymphoma   08/14/2013 - 01/31/2014 Chemotherapy Patient was started on treatment with ABVD, completed 6 cycles of treatment.   11/22/2013 Imaging Repeat staging CT scans showed marked response to treatment.   03/01/2014 Imaging PET CT scan show complete remission.   08/29/2014 Imaging RepeaT PET CT scan showed regression in the size of LN but with mild increased hypermetabolic activity.   10/24/2014 Imaging Repeat PET/CT scan show persistent hypermetabolic activity in the mediastinal lymph node.   12/13/2014 Surgery She had excision of mediastinal mass with negative cancer recurrence   03/13/2015 Imaging PET CT scan showed stable lymphadenopathy    INTERVAL HISTORY: Please see below for problem oriented charting.  she feels well. Denies recent infection. No new lymphadenopathy.  REVIEW OF SYSTEMS:   Constitutional: Denies fevers, chills or abnormal weight loss Eyes: Denies blurriness of vision Ears, nose, mouth, throat, and face: Denies mucositis or sore  throat Respiratory: Denies cough, dyspnea or wheezes Cardiovascular: Denies palpitation, chest discomfort or lower extremity swelling Gastrointestinal:  Denies nausea, heartburn or change in bowel habits Skin: Denies abnormal skin rashes Lymphatics: Denies new lymphadenopathy or easy bruising Neurological:Denies numbness, tingling or new weaknesses Behavioral/Psych: Mood is stable, no new changes  All other systems were reviewed with the patient and are negative.  I have reviewed the past medical history, past surgical history, social history and family history with the patient and they are unchanged from previous note.  ALLERGIES:  is allergic to adhesive.  MEDICATIONS:  Current Outpatient Prescriptions  Medication Sig Dispense Refill  . ACZONE 5 % topical gel Apply 5 each topically 2 (two) times daily.  3  . adapalene (DIFFERIN) 0.1 % cream Apply 0.1 each topically at bedtime. To affected area  3  . ibuprofen (ADVIL,MOTRIN) 200 MG tablet Take 400-600 mg by mouth every 6 (six) hours as needed for fever, headache, mild pain, moderate pain or cramping.     . lidocaine-prilocaine (EMLA) cream Apply 1 application topically as needed. Apply to portacath site 1-2 hours prior to use. 30 g 3  . loratadine (CLARITIN) 10 MG tablet Take 10 mg by mouth daily.      No current facility-administered medications for this visit.    PHYSICAL EXAMINATION: ECOG PERFORMANCE STATUS: 0 - Asymptomatic  Filed Vitals:   03/14/15 1518  BP: 119/70  Pulse: 67  Temp: 98 F (36.7 C)  Resp: 18   Filed Weights   03/14/15 1518  Weight: 261 lb 9.6 oz (118.661 kg)    GENERAL:alert, no distress and comfortable SKIN: skin color, texture, turgor are normal, no rashes or significant lesions EYES:  normal, Conjunctiva are pink and non-injected, sclera clear Musculoskeletal:no cyanosis of digits and no clubbing  NEURO: alert & oriented x 3 with fluent speech, no focal motor/sensory deficits  LABORATORY DATA:   I have reviewed the data as listed    Component Value Date/Time   NA 141 03/13/2015 0809   NA 138 09/09/2013 0940   K 4.0 03/13/2015 0809   K 4.1 09/09/2013 0940   CL 105 09/09/2013 0940   CO2 25 03/13/2015 0809   CO2 24 09/09/2013 0940   GLUCOSE 94 03/13/2015 0809   GLUCOSE 88 09/09/2013 0940   BUN 13.7 03/13/2015 0809   BUN 15 09/09/2013 0940   CREATININE 0.8 03/13/2015 0809   CREATININE 0.71 09/09/2013 0940   CALCIUM 8.8 03/13/2015 0809   CALCIUM 9.0 09/09/2013 0940   PROT 6.6 03/13/2015 0809   PROT 6.7 08/15/2013 0515   ALBUMIN 3.8 03/13/2015 0809   ALBUMIN 3.0* 08/15/2013 0515   AST 10 03/13/2015 0809   AST 8 08/15/2013 0515   ALT 14 03/13/2015 0809   ALT 16 08/15/2013 0515   ALKPHOS 89 03/13/2015 0809   ALKPHOS 85 08/15/2013 0515   BILITOT 0.38 03/13/2015 0809   BILITOT 0.1* 08/15/2013 0515   GFRNONAA >90 09/09/2013 0940   GFRAA >90 09/09/2013 0940    No results found for: SPEP, UPEP  Lab Results  Component Value Date   WBC 5.9 03/13/2015   NEUTROABS 4.0 03/13/2015   HGB 12.5 03/13/2015   HCT 38.9 03/13/2015   MCV 84.0 03/13/2015   PLT 240 03/13/2015      Chemistry      Component Value Date/Time   NA 141 03/13/2015 0809   NA 138 09/09/2013 0940   K 4.0 03/13/2015 0809   K 4.1 09/09/2013 0940   CL 105 09/09/2013 0940   CO2 25 03/13/2015 0809   CO2 24 09/09/2013 0940   BUN 13.7 03/13/2015 0809   BUN 15 09/09/2013 0940   CREATININE 0.8 03/13/2015 0809   CREATININE 0.71 09/09/2013 0940      Component Value Date/Time   CALCIUM 8.8 03/13/2015 0809   CALCIUM 9.0 09/09/2013 0940   ALKPHOS 89 03/13/2015 0809   ALKPHOS 85 08/15/2013 0515   AST 10 03/13/2015 0809   AST 8 08/15/2013 0515   ALT 14 03/13/2015 0809   ALT 16 08/15/2013 0515   BILITOT 0.38 03/13/2015 0809   BILITOT 0.1* 08/15/2013 0515       RADIOGRAPHIC STUDIES: I have personally reviewed the radiological images as listed and agreed with the findings in the report. Nm Pet Image  Restag (ps) Skull Base To Thigh  03/13/2015   CLINICAL DATA:  Subsequent treatment strategy for Hodgkin's lymphoma.  EXAM: NUCLEAR MEDICINE PET SKULL BASE TO THIGH  TECHNIQUE: 74. MCi F-18 FDG was injected intravenously. Full-ring PET imaging was performed from the skull base to thigh after the radiotracer. CT data was obtained and used for attenuation correction and anatomic localization.  FASTING BLOOD GLUCOSE:  Value: 92 mg/dl  COMPARISON:  10/24/2014.  FINDINGS: NECK  No hypermetabolic lymph nodes in the neck.  CHEST  The partially calcified mediastinal lymph nodes are again noted. These are stable in size and appearance. Index prevascular lymph node measured previously at 2.5 cm is unchanged at 2.5 cm short axis. This particular index lymph node is not show hypermetabolism above background soft tissue levels.  On the previous study, a 1.8 cm prevascular lymph node was hypermetabolic. This lymph node remains stable at 1.7 cm  short axis (see image 65 series 4). SUV max = 5.8 on today's study compared to 6.7 previously.  No new or enlarging mediastinal or hilar lymph nodes. The tip of the right-sided Port-A-Cath is positioned in the right atrium. Heart size is normal. No pericardial effusion. Mild FDG uptake in the distal esophagus is stable in may be physiologic or related to some underlying inflammation in the distal esophagus.  No suspicious pulmonary nodule or mass. No focal airspace consolidation. No pleural effusion.  ABDOMEN/PELVIS  No abnormal hypermetabolic activity within the liver, pancreas, adrenal glands, or spleen. No hypermetabolic lymph nodes in the abdomen or pelvis.  SKELETON  No focal hypermetabolic activity to suggest skeletal metastasis.  IMPRESSION: Stable exam. The mild residual hypermetabolism associated with the stable anterior mediastinal lymphadenopathy remains moderately higher than background liver parenchyma (Deauville score 4). No evidence for new or progressive disease on the  current exam.   Electronically Signed   By: Misty Stanley M.D.   On: 03/13/2015 12:37     ASSESSMENT & PLAN:  Hodgkin's lymphoma, nodular sclerosis  I have reviewed her case at that hematology tumor board today and her oncologist at [place]. 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 Recent surgical resection in March 2016 at [facility] showed benign pathology. She is asymptomatic. I recommend removing of her port and see her back in 6 months with repeat history, physical examination, blood work and imaging study.   Orders Placed This Encounter  Procedures  . IR Removal Tun Access W/ Port W/O FL    Standing Status: Future     Number of Occurrences:      Standing Expiration Date: 05/13/2016    Order Specific Question:  Reason for exam:    Answer:  remove port, no need    Order Specific Question:  Is the patient pregnant?    Answer:  No    Order Specific Question:  Preferred Imaging Location?    Answer:  [place] PET Image Restag (PS) Skull Base To Thigh    Standing Status: Future     Number of Occurrences:      Standing Expiration Date: 05/13/2016    Order Specific Question:  Reason for Exam (SYMPTOM  OR DIAGNOSIS REQUIRED)    Answer:  staging lymphoma, exclude recurrence    Order Specific Question:  Is the patient pregnant?    Answer:  No    Order Specific Question:  Preferred imaging location?    Answer:  [facility]  . Comprehensive metabolic panel    Standing Status: Future     Number of Occurrences:      Standing Expiration Date: 05/13/2016  . CBC with Differential/Platelet    Standing Status: Future     Number of Occurrences:      Standing Expiration Date: 05/13/2016  . Lactate dehydrogenase    Standing Status: Future     Number of Occurrences:      Standing Expiration Date: 05/13/2016   All questions were answered. The patient knows to call the clinic with any problems, questions or concerns. No barriers to learning  was detected. I spent 15 minutes counseling the patient face to face. The total time spent in the appointment was 20 minutes and more than 50% was on counseling and review of test results     [facility], [place], MD 03/14/2015 3:36 PM

## 2015-03-14 NOTE — Telephone Encounter (Signed)
lvm for pt regarding to DEC appt....mailed pt appt sched/avs and letter °

## 2015-03-16 ENCOUNTER — Other Ambulatory Visit: Payer: Self-pay | Admitting: Radiology

## 2015-03-17 ENCOUNTER — Ambulatory Visit (HOSPITAL_COMMUNITY)
Admission: RE | Admit: 2015-03-17 | Discharge: 2015-03-17 | Disposition: A | Discharge status: Home / Self Care | Payer: 59 | Source: Ambulatory Visit | Attending: Hematology and Oncology | Admitting: Hematology and Oncology

## 2015-03-17 ENCOUNTER — Encounter (HOSPITAL_COMMUNITY): Payer: Self-pay

## 2015-03-17 DIAGNOSIS — Z452 Encounter for adjustment and management of vascular access device: Secondary | ICD-10-CM | POA: Insufficient documentation

## 2015-03-17 DIAGNOSIS — Z8571 Personal history of Hodgkin lymphoma: Secondary | ICD-10-CM | POA: Insufficient documentation

## 2015-03-17 DIAGNOSIS — C8110 Nodular sclerosis classical Hodgkin lymphoma, unspecified site: Secondary | ICD-10-CM

## 2015-03-17 MED ORDER — LIDOCAINE-EPINEPHRINE 2 %-1:100000 IJ SOLN
INTRAMUSCULAR | Status: AC
  Filled 2015-03-17: qty 1

## 2015-03-17 MED ORDER — CEFAZOLIN SODIUM-DEXTROSE 2-3 GM-% IV SOLR
2.0000 g | Freq: Once | INTRAVENOUS | Status: AC
  Administered 2015-03-17: 2 g via INTRAVENOUS

## 2015-03-17 MED ORDER — CEFAZOLIN SODIUM-DEXTROSE 2-3 GM-% IV SOLR
INTRAVENOUS | Status: AC
  Filled 2015-03-17: qty 50

## 2015-03-17 MED ORDER — SODIUM CHLORIDE 0.9 % IV SOLN
INTRAVENOUS | Status: DC
  Administered 2015-03-17: 08:00:00 via INTRAVENOUS

## 2015-03-17 NOTE — Discharge Instructions (Signed)
Incision Care ° An incision is a surgical cut to open your skin. You need to take care of your incision. This helps you to not get an infection. °HOME CARE °· Only take medicine as told by your doctor. °· Do not take off your bandage (dressing) or get your incision wet until your doctor approves. Change the bandage and call your doctor if the bandage gets wet, dirty, or starts to smell. °· Take showers. Do not take baths, swim, or do anything that may soak your incision until it heals. °· Return to your normal diet and activities as told or allowed by your doctor. °· Avoid lifting any weight until your doctor approves. °· Put medicine that helps lessen itching on your incision as told by your doctor. Do not pick or scratch at your incision. °· Keep your doctor visit to have your stitches (sutures) or staples removed. °· Drink enough fluids to keep your pee (urine) clear or pale yellow. °GET HELP RIGHT AWAY IF: °· You have a fever. °· You have a rash. °· You are dizzy, or you pass out (faint) while standing. °· You have trouble breathing. °· You have a reaction or side effects to medicine given to you. °· You have redness, puffiness (swelling), or more pain in the incision and medicine does not help. °· You have fluid, blood, or yellowish-white fluid (pus) coming from the incision lasting over 1 day. °· You have muscle aches, chills, or you feel sick. °· You have a bad smell coming from the incision or bandage. °· Your incision opens up after stitches, staples, or sticky strips have been removed. °· You keep feeling sick to your stomach (nauseous) or keep throwing up (vomiting). °MAKE SURE YOU:  °· Understand these instructions. °· Will watch your condition. °· Will get help right away if you are not doing well or get worse. °Document Released: 12/02/2011 Document Reviewed: 11/03/2013 °Exit[employer]® Patient Information ©2015 [employer]. This information is not intended to replace advice given to you by your health  care provider. Make sure you discuss any questions you have with your health care provider. ° ° ° ° °

## 2015-03-17 NOTE — Procedures (Signed)
Successful RT IJ POWER PORT REMOVAL NO COMP STABLE FULL REPORT IN PACS   

## 2015-08-15 ENCOUNTER — Encounter: Payer: Self-pay | Admitting: *Deleted

## 2015-08-15 ENCOUNTER — Other Ambulatory Visit: Payer: Self-pay | Admitting: *Deleted

## 2015-09-07 ENCOUNTER — Telehealth: Payer: Self-pay | Admitting: *Deleted

## 2015-09-07 ENCOUNTER — Telehealth: Payer: Self-pay | Admitting: Hematology and Oncology

## 2015-09-07 NOTE — Telephone Encounter (Signed)
VM message received @ 1339 from patient inquiring about her schedule. She is scheduled for PET scan on 09/11/15 @ 8am and lab work @ 11:30 am the same day. She wanted to know if that was correct as she states she usually has labs on a different day than scan. She has appt with Dr. Alvy Bimler on 09/12/15. Pt requesting return call.

## 2015-09-07 NOTE — Telephone Encounter (Signed)
Called and left a message with new lab time 12/19

## 2015-09-07 NOTE — Telephone Encounter (Signed)
LVM for pt informing her we will move lab closer to PET on 12/19.  POF sent to move lab to 9:30 am to give her time to come over from PET scan.  Asked her to call back if she cannot do this or if she really wants the labs done w/ MD visit.

## 2015-09-07 NOTE — Telephone Encounter (Signed)
Move her labs to 9 am Ok to do scans and lab on the same day on Monday next week

## 2015-09-11 ENCOUNTER — Other Ambulatory Visit: Payer: 59

## 2015-09-11 ENCOUNTER — Other Ambulatory Visit (HOSPITAL_BASED_OUTPATIENT_CLINIC_OR_DEPARTMENT_OTHER): Payer: 59

## 2015-09-11 ENCOUNTER — Ambulatory Visit: Payer: 59 | Admitting: Hematology and Oncology

## 2015-09-11 ENCOUNTER — Ambulatory Visit (HOSPITAL_COMMUNITY)
Admission: RE | Admit: 2015-09-11 | Discharge: 2015-09-11 | Disposition: A | Discharge status: Home / Self Care | Payer: 59 | Source: Ambulatory Visit | Attending: Hematology and Oncology | Admitting: Hematology and Oncology

## 2015-09-11 DIAGNOSIS — C8110 Nodular sclerosis classical Hodgkin lymphoma, unspecified site: Secondary | ICD-10-CM | POA: Diagnosis not present

## 2015-09-11 LAB — COMPREHENSIVE METABOLIC PANEL
ALT: 14 U/L (ref 0–55)
AST: 11 U/L (ref 5–34)
Albumin: 4 g/dL (ref 3.5–5.0)
Alkaline Phosphatase: 93 U/L (ref 40–150)
Anion Gap: 9 mEq/L (ref 3–11)
BILIRUBIN TOTAL: 0.47 mg/dL (ref 0.20–1.20)
BUN: 12.5 mg/dL (ref 7.0–26.0)
CHLORIDE: 104 meq/L (ref 98–109)
CO2: 24 meq/L (ref 22–29)
Calcium: 9.4 mg/dL (ref 8.4–10.4)
Creatinine: 0.8 mg/dL (ref 0.6–1.1)
EGFR: >90 mL/min/{1.73_m2} (ref >90)
GLUCOSE: 92 mg/dL (ref 70–140)
Potassium: 4.2 mEq/L (ref 3.5–5.1)
Sodium: 137 mEq/L (ref 136–145)
Total Protein: 7.3 g/dL (ref 6.4–8.3)

## 2015-09-11 LAB — CBC WITH DIFFERENTIAL/PLATELET
BASO%: 0.5 % (ref 0.0–2.0)
Basophils Absolute: 0 10*3/uL (ref 0.0–0.1)
EOS%: 0.6 % (ref 0.0–7.0)
Eosinophils Absolute: 0 10*3/uL (ref 0.0–0.5)
HCT: 41.1 % (ref 34.8–46.6)
HGB: 13.1 g/dL (ref 11.6–15.9)
LYMPH%: 27.6 % (ref 14.0–49.7)
MCH: 26.3 pg (ref 25.1–34.0)
MCHC: 31.9 g/dL (ref 31.5–36.0)
MCV: 82.4 fL (ref 79.5–101.0)
MONO#: 0.2 10*3/uL (ref 0.1–0.9)
MONO%: 4.9 % (ref 0.0–14.0)
NEUT%: 66.4 % (ref 38.4–76.8)
NEUTROS ABS: 3.3 10*3/uL (ref 1.5–6.5)
Platelets: 265 10*3/uL (ref 145–400)
RBC: 4.99 10*6/uL (ref 3.70–5.45)
RDW: 13.4 % (ref 11.2–14.5)
WBC: 5 10*3/uL (ref 3.9–10.3)
lymph#: 1.4 10*3/uL (ref 0.9–3.3)

## 2015-09-11 LAB — LACTATE DEHYDROGENASE: LDH: 159 U/L (ref 125–245)

## 2015-09-11 LAB — GLUCOSE, CAPILLARY: Glucose-Capillary: 95 mg/dL (ref 65–99)

## 2015-09-11 MED ORDER — FLUDEOXYGLUCOSE F - 18 (FDG) INJECTION
12.8000 | Freq: Once | INTRAVENOUS | Status: AC | PRN
  Administered 2015-09-11: 12.8 via INTRAVENOUS

## 2015-09-12 ENCOUNTER — Encounter: Payer: Self-pay | Admitting: Hematology and Oncology

## 2015-09-12 ENCOUNTER — Ambulatory Visit (HOSPITAL_BASED_OUTPATIENT_CLINIC_OR_DEPARTMENT_OTHER): Payer: 59 | Admitting: Hematology and Oncology

## 2015-09-12 VITALS — BP 113/74 | HR 88 | Temp 97.3°F | Resp 18 | Ht 70.0 in | Wt 260.8 lb

## 2015-09-12 DIAGNOSIS — Z8571 Personal history of Hodgkin lymphoma: Secondary | ICD-10-CM

## 2015-09-12 DIAGNOSIS — C8112 Nodular sclerosis classical Hodgkin lymphoma, intrathoracic lymph nodes: Secondary | ICD-10-CM

## 2015-09-12 NOTE — Progress Notes (Signed)
[place] OFFICE PROGRESS NOTE  Patient Care Team: Berkley Harvey, NP as PCP - General (Nurse Practitioner) Izora Gala, MD as Consulting Physician (Otolaryngology) Melrose Nakayama, MD as Consulting Physician (Cardiothoracic Surgery) Clydene Fake, MD as Consulting Physician (Hematology and Oncology) Lindajo Royal Zigmund Gottron., MD as Consulting Physician (Cardiothoracic Surgery)  SUMMARY OF ONCOLOGIC HISTORY: Oncology History   Hodgkin's lymphoma, nodular sclerosis   Primary site: Lymphoid Neoplasms   Staging method: AJCC 6th Edition   Clinical: Stage III signed by Heath Lark, MD on 12/29/2013  8:11 PM   Pathologic: Stage III signed by Heath Lark, MD on 12/29/2013  8:11 PM   Summary: Stage III       History of hodgkin's lymphoma   07/30/2013 Imaging CT scans show disease on both sides of the diaphragm with splenic involvement.   08/09/2013 Surgery Mediastinoscopy and biopsy confirmed classical Hodgkin lymphoma   08/14/2013 - 01/31/2014 Chemotherapy Patient was started on treatment with ABVD, completed 6 cycles of treatment.   11/22/2013 Imaging Repeat staging CT scans showed marked response to treatment.   03/01/2014 Imaging PET CT scan show complete remission.   08/29/2014 Imaging RepeaT PET CT scan showed regression in the size of LN but with mild increased hypermetabolic activity.   10/24/2014 Imaging Repeat PET/CT scan show persistent hypermetabolic activity in the mediastinal lymph node.   12/13/2014 Surgery She had excision of mediastinal mass with negative cancer recurrence   03/13/2015 Imaging PET CT scan showed stable lymphadenopathy   03/17/2015 Procedure Port is removed   09/11/2015 Imaging PET CT scan showed stable lymphadenopathy    INTERVAL HISTORY: Please see below for problem oriented charting. She feels well. Denies recent infection. No new lymphadenopathy.  REVIEW OF SYSTEMS:   Constitutional: Denies fevers, chills or abnormal weight loss Eyes: Denies  blurriness of vision Ears, nose, mouth, throat, and face: Denies mucositis or sore throat Respiratory: Denies cough, dyspnea or wheezes Cardiovascular: Denies palpitation, chest discomfort or lower extremity swelling Gastrointestinal:  Denies nausea, heartburn or change in bowel habits Skin: Denies abnormal skin rashes Lymphatics: Denies new lymphadenopathy or easy bruising Neurological:Denies numbness, tingling or new weaknesses Behavioral/Psych: Mood is stable, no new changes  All other systems were reviewed with the patient and are negative.  I have reviewed the past medical history, past surgical history, social history and family history with the patient and they are unchanged from previous note.  ALLERGIES:  is allergic to adhesive.  MEDICATIONS:  Current Outpatient Prescriptions  Medication Sig Dispense Refill  . ACZONE 5 % topical gel Apply 5 each topically 2 (two) times daily.  3  . adapalene (DIFFERIN) 0.1 % cream Apply 0.1 each topically at bedtime. To affected area  3  . ibuprofen (ADVIL,MOTRIN) 200 MG tablet Take 400-600 mg by mouth every 6 (six) hours as needed for fever, headache, mild pain, moderate pain or cramping.     . loratadine (CLARITIN) 10 MG tablet Take 10 mg by mouth daily.      No current facility-administered medications for this visit.    PHYSICAL EXAMINATION: ECOG PERFORMANCE STATUS: 0 - Asymptomatic  Filed Vitals:   09/12/15 1455  BP: 113/74  Pulse: 88  Temp: 97.3 F (36.3 C)  Resp: 18   Filed Weights   09/12/15 1455  Weight: 260 lb 12.8 oz (118.298 kg)    GENERAL:alert, no distress and comfortable SKIN: skin color, texture, turgor are normal, no rashes or significant lesions EYES: normal, Conjunctiva are pink and non-injected, sclera  clear OROPHARYNX:no exudate, no erythema and lips, buccal mucosa, and tongue normal  NECK: supple, thyroid normal size, non-tender, without nodularity LYMPH:  no palpable lymphadenopathy in the cervical,  axillary or inguinal LUNGS: clear to auscultation and percussion with normal breathing effort HEART: regular rate & rhythm and no murmurs and no lower extremity edema ABDOMEN:abdomen soft, non-tender and normal bowel sounds Musculoskeletal:no cyanosis of digits and no clubbing  NEURO: alert & oriented x 3 with fluent speech, no focal motor/sensory deficits  LABORATORY DATA:  I have reviewed the data as listed    Component Value Date/Time   NA 137 09/11/2015 0954   NA 138 09/09/2013 0940   K 4.2 09/11/2015 0954   K 4.1 09/09/2013 0940   CL 105 09/09/2013 0940   CO2 24 09/11/2015 0954   CO2 24 09/09/2013 0940   GLUCOSE 92 09/11/2015 0954   GLUCOSE 88 09/09/2013 0940   BUN 12.5 09/11/2015 0954   BUN 15 09/09/2013 0940   CREATININE 0.8 09/11/2015 0954   CREATININE 0.71 09/09/2013 0940   CALCIUM 9.4 09/11/2015 0954   CALCIUM 9.0 09/09/2013 0940   PROT 7.3 09/11/2015 0954   PROT 6.7 08/15/2013 0515   ALBUMIN 4.0 09/11/2015 0954   ALBUMIN 3.0* 08/15/2013 0515   AST 11 09/11/2015 0954   AST 8 08/15/2013 0515   ALT 14 09/11/2015 0954   ALT 16 08/15/2013 0515   ALKPHOS 93 09/11/2015 0954   ALKPHOS 85 08/15/2013 0515   BILITOT 0.47 09/11/2015 0954   BILITOT 0.1* 08/15/2013 0515   GFRNONAA >90 09/09/2013 0940   GFRAA >90 09/09/2013 0940    No results found for: SPEP, UPEP  Lab Results  Component Value Date   WBC 5.0 09/11/2015   NEUTROABS 3.3 09/11/2015   HGB 13.1 09/11/2015   HCT 41.1 09/11/2015   MCV 82.4 09/11/2015   PLT 265 09/11/2015      Chemistry      Component Value Date/Time   NA 137 09/11/2015 0954   NA 138 09/09/2013 0940   K 4.2 09/11/2015 0954   K 4.1 09/09/2013 0940   CL 105 09/09/2013 0940   CO2 24 09/11/2015 0954   CO2 24 09/09/2013 0940   BUN 12.5 09/11/2015 0954   BUN 15 09/09/2013 0940   CREATININE 0.8 09/11/2015 0954   CREATININE 0.71 09/09/2013 0940      Component Value Date/Time   CALCIUM 9.4 09/11/2015 0954   CALCIUM 9.0 09/09/2013  0940   ALKPHOS 93 09/11/2015 0954   ALKPHOS 85 08/15/2013 0515   AST 11 09/11/2015 0954   AST 8 08/15/2013 0515   ALT 14 09/11/2015 0954   ALT 16 08/15/2013 0515   BILITOT 0.47 09/11/2015 0954   BILITOT 0.1* 08/15/2013 0515       RADIOGRAPHIC STUDIES: I have personally reviewed the radiological images as listed and agreed with the findings in the report. Nm Pet Image Restag (ps) Skull Base To Thigh  09/11/2015  CLINICAL DATA:  Subsequent treatment strategy for Hodgkin's lymphoma. EXAM: NUCLEAR MEDICINE PET SKULL BASE TO THIGH TECHNIQUE: 12.8 mCi F-18 FDG was injected intravenously. Full-ring PET imaging was performed from the skull base to thigh after the radiotracer. CT data was obtained and used for attenuation correction and anatomic localization. FASTING BLOOD GLUCOSE:  Value: 95 mg/dl COMPARISON:  03/13/2015 FINDINGS: NECK No hypermetabolic lymph nodes in the neck. CHEST Lungs are clear. No suspicious pulmonary nodules. No focal consolidation. No pleural effusion or pneumothorax. The heart is normal in size.  No pericardial effusion. 1.9 cm prevascular node (series 4/image 67) with max SUV 4.8, previously 1.7 cm with max SUV 5.8. Additional 2.1 cm short axis calcified prevascular node (series 4/image 66), non FDG avid. ABDOMEN/PELVIS No abnormal hypermetabolic activity within the liver, pancreas, adrenal glands, or spleen. Background liver metabolism measures max SUV 5.3. 2.5 cm short axis left ovarian cyst/follicle (series 4/image 139), likely physiologic. No hypermetabolic lymph nodes in the abdomen or pelvis. SKELETON No focal hypermetabolic activity to suggest skeletal metastasis. IMPRESSION: 1.9 cm hypervascular short axis prevascular node, stable versus mildly improved when compared to hepatic background activity, Deauville category 3. Electronically Signed   By: Julian Hy M.D.   On: 09/11/2015 10:18     ASSESSMENT & PLAN:  History of hodgkin's lymphoma  I have reviewed her  case at that hematology tumor board today. 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 Recent surgical resection in March 2016 at [name] showed benign pathology. She is asymptomatic. I recommend see her back in 6 months with repeat history, physical examination, blood work and imaging study.      Orders Placed This Encounter  Procedures  . CT Chest W Contrast    Standing Status: Future     Number of Occurrences:      Standing Expiration Date: 11/11/2016    Order Specific Question:  Reason for Exam (SYMPTOM  OR DIAGNOSIS REQUIRED)    Answer:  hodgkin lymphoma, exclude recurrence    Order Specific Question:  Is the patient pregnant?    Answer:  No    Order Specific Question:  Preferred imaging location?    Answer:  [facility]  . Comprehensive metabolic panel    Standing Status: Future     Number of Occurrences:      Standing Expiration Date: 11/11/2016  . CBC with Differential/Platelet    Standing Status: Future     Number of Occurrences:      Standing Expiration Date: 11/11/2016  . Pregnancy, urine    Standing Status: Future     Number of Occurrences:      Standing Expiration Date: 11/11/2016   All questions were answered. The patient knows to call the clinic with any problems, questions or concerns. No barriers to learning was detected. I spent 15 minutes counseling the patient face to face. The total time spent in the appointment was 20 minutes and more than 50% was on counseling and review of test results     [facility], [place], MD 09/12/2015 3:19 PM

## 2015-09-12 NOTE — Assessment & Plan Note (Signed)
I have reviewed her case at that hematology tumor board today. 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 Recent surgical resection in March 2016 at [facility] showed benign pathology. She is asymptomatic. I recommend see her back in 6 months with repeat history, physical examination, blood work and imaging study.

## 2015-09-14 ENCOUNTER — Telehealth: Payer: Self-pay | Admitting: Hematology and Oncology

## 2015-09-14 NOTE — Telephone Encounter (Signed)
Called and left a message with appointments °

## 2016-01-24 ENCOUNTER — Other Ambulatory Visit: Payer: Self-pay | Admitting: *Deleted

## 2016-02-05 ENCOUNTER — Other Ambulatory Visit (HOSPITAL_BASED_OUTPATIENT_CLINIC_OR_DEPARTMENT_OTHER): Payer: 59

## 2016-02-05 ENCOUNTER — Encounter (HOSPITAL_COMMUNITY): Payer: Self-pay

## 2016-02-05 ENCOUNTER — Other Ambulatory Visit (HOSPITAL_COMMUNITY)
Admission: RE | Admit: 2016-02-05 | Discharge: 2016-02-05 | Disposition: A | Discharge status: Home / Self Care | Payer: 59 | Source: Ambulatory Visit | Attending: Hematology and Oncology | Admitting: Hematology and Oncology

## 2016-02-05 ENCOUNTER — Ambulatory Visit (HOSPITAL_COMMUNITY)
Admission: RE | Admit: 2016-02-05 | Discharge: 2016-02-05 | Disposition: A | Discharge status: Home / Self Care | Payer: 59 | Source: Ambulatory Visit | Attending: Hematology and Oncology | Admitting: Hematology and Oncology

## 2016-02-05 DIAGNOSIS — Z8571 Personal history of Hodgkin lymphoma: Secondary | ICD-10-CM | POA: Diagnosis not present

## 2016-02-05 DIAGNOSIS — C8112 Nodular sclerosis classical Hodgkin lymphoma, intrathoracic lymph nodes: Secondary | ICD-10-CM | POA: Insufficient documentation

## 2016-02-05 LAB — CBC WITH DIFFERENTIAL/PLATELET
BASO%: 0.3 % (ref 0.0–2.0)
Basophils Absolute: 0 10*3/uL (ref 0.0–0.1)
EOS%: 0.8 % (ref 0.0–7.0)
Eosinophils Absolute: 0.1 10*3/uL (ref 0.0–0.5)
HCT: 38.7 % (ref 34.8–46.6)
HGB: 12.4 g/dL (ref 11.6–15.9)
LYMPH%: 28.6 % (ref 14.0–49.7)
MCH: 26.7 pg (ref 25.1–34.0)
MCHC: 32 g/dL (ref 31.5–36.0)
MCV: 83.4 fL (ref 79.5–101.0)
MONO#: 0.3 10*3/uL (ref 0.1–0.9)
MONO%: 5.2 % (ref 0.0–14.0)
NEUT#: 3.8 10*3/uL (ref 1.5–6.5)
NEUT%: 65.1 % (ref 38.4–76.8)
Platelets: 251 10*3/uL (ref 145–400)
RBC: 4.64 10*6/uL (ref 3.70–5.45)
RDW: 13.1 % (ref 11.2–14.5)
WBC: 5.9 10*3/uL (ref 3.9–10.3)
lymph#: 1.7 10*3/uL (ref 0.9–3.3)

## 2016-02-05 LAB — COMPREHENSIVE METABOLIC PANEL
ALBUMIN: 4 g/dL (ref 3.5–5.0)
ALK PHOS: 85 U/L (ref 40–150)
ALT: 17 U/L (ref 0–55)
AST: 12 U/L (ref 5–34)
Anion Gap: 7 mEq/L (ref 3–11)
BUN: 9.5 mg/dL (ref 7.0–26.0)
CALCIUM: 9.3 mg/dL (ref 8.4–10.4)
CO2: 26 mEq/L (ref 22–29)
Chloride: 106 mEq/L (ref 98–109)
Creatinine: 0.8 mg/dL (ref 0.6–1.1)
Glucose: 85 mg/dl (ref 70–140)
Potassium: 4.2 mEq/L (ref 3.5–5.1)
Sodium: 139 mEq/L (ref 136–145)
Total Bilirubin: 0.38 mg/dL (ref 0.20–1.20)
Total Protein: 7.1 g/dL (ref 6.4–8.3)

## 2016-02-05 LAB — PREGNANCY, URINE: Preg Test, Ur: NEGATIVE

## 2016-02-05 MED ORDER — IOPAMIDOL (ISOVUE-300) INJECTION 61%
75.0000 mL | Freq: Once | INTRAVENOUS | Status: AC | PRN
  Administered 2016-02-05: 75 mL via INTRAVENOUS

## 2016-02-06 ENCOUNTER — Ambulatory Visit (HOSPITAL_BASED_OUTPATIENT_CLINIC_OR_DEPARTMENT_OTHER): Payer: 59 | Admitting: Hematology and Oncology

## 2016-02-06 ENCOUNTER — Telehealth: Payer: Self-pay | Admitting: Internal Medicine

## 2016-02-06 ENCOUNTER — Encounter: Payer: Self-pay | Admitting: Hematology and Oncology

## 2016-02-06 VITALS — BP 116/75 | HR 95 | Temp 98.0°F | Resp 18 | Ht 70.0 in | Wt 262.9 lb

## 2016-02-06 DIAGNOSIS — Z8571 Personal history of Hodgkin lymphoma: Secondary | ICD-10-CM

## 2016-02-06 DIAGNOSIS — Z515 Encounter for palliative care: Secondary | ICD-10-CM | POA: Insufficient documentation

## 2016-02-06 NOTE — Assessment & Plan Note (Signed)
We discussed increase physical activity and possible enrollment with the [employer] program with the [employer]. I gave her additional resources from the [employer]uk[employer]ymphoma [employer]. We discussed importance of vitamin D supplementation.  

## 2016-02-06 NOTE — Assessment & Plan Note (Signed)
I have reviewed her case at that hematology tumor board today. 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 CT showed stable appearance Recent surgical resection in March 2016 at [facility] showed benign pathology. She is asymptomatic. We discussed cancer survivorship care plan and current recommendations from [facility]. I recommend transitioning her care to cancer survivorship clinic and she agreed

## 2016-02-06 NOTE — Telephone Encounter (Signed)
per pof to sch pt appt-sent to [facility] to sch Survivorship-adv pt she will call ot sch with them

## 2016-02-06 NOTE — Progress Notes (Signed)
[facility] OFFICE PROGRESS NOTE  Patient Care Team: Berkley Harvey, NP as PCP - General (Nurse Practitioner) Izora Gala, MD as Consulting Physician (Otolaryngology) Melrose Nakayama, MD as Consulting Physician (Cardiothoracic Surgery) Clydene Fake, MD as Consulting Physician (Hematology and Oncology) Lindajo Royal Zigmund Gottron., MD as Consulting Physician (Cardiothoracic Surgery)  SUMMARY OF ONCOLOGIC HISTORY: Oncology History   Hodgkin's lymphoma, nodular sclerosis   Primary site: Lymphoid Neoplasms   Staging method: AJCC 6th Edition   Clinical: Stage III signed by Heath Lark, MD on 12/29/2013  8:11 PM   Pathologic: Stage III signed by Heath Lark, MD on 12/29/2013  8:11 PM   Summary: Stage III       History of hodgkin's lymphoma   07/30/2013 Imaging CT scans show disease on both sides of the diaphragm with splenic involvement.   08/09/2013 Surgery Mediastinoscopy and biopsy confirmed classical Hodgkin lymphoma   08/14/2013 - 01/31/2014 Chemotherapy Patient was started on treatment with ABVD, completed 6 cycles of treatment.   11/22/2013 Imaging Repeat staging CT scans showed marked response to treatment.   03/01/2014 Imaging PET CT scan show complete remission.   08/29/2014 Imaging RepeaT PET CT scan showed regression in the size of LN but with mild increased hypermetabolic activity.   10/24/2014 Imaging Repeat PET/CT scan show persistent hypermetabolic activity in the mediastinal lymph node.   12/13/2014 Surgery She had excision of mediastinal mass with negative cancer recurrence   03/13/2015 Imaging PET CT scan showed stable lymphadenopathy   03/17/2015 Procedure Port is removed   09/11/2015 Imaging PET CT scan showed stable lymphadenopathy   02/05/2016 Imaging CT chest showed stable lymphadenopathy    INTERVAL HISTORY: Please see below for problem oriented charting. She feels well. She complained of minor cold but denies recent infection. No new lymphadenopathy. She is  recently engaged and will be getting married next July.  REVIEW OF SYSTEMS:   Constitutional: Denies fevers, chills or abnormal weight loss Eyes: Denies blurriness of vision Ears, nose, mouth, throat, and face: Denies mucositis or sore throat Respiratory: Denies cough, dyspnea or wheezes Cardiovascular: Denies palpitation, chest discomfort or lower extremity swelling Gastrointestinal:  Denies nausea, heartburn or change in bowel habits Skin: Denies abnormal skin rashes Lymphatics: Denies new lymphadenopathy or easy bruising Neurological:Denies numbness, tingling or new weaknesses Behavioral/Psych: Mood is stable, no new changes  All other systems were reviewed with the patient and are negative.  I have reviewed the past medical history, past surgical history, social history and family history with the patient and they are unchanged from previous note.  ALLERGIES:  is allergic to adhesive.  MEDICATIONS:  Current Outpatient Prescriptions  Medication Sig Dispense Refill  . ACZONE 5 % topical gel Apply 5 each topically 2 (two) times daily.  3  . adapalene (DIFFERIN) 0.1 % cream Apply 0.1 each topically at bedtime. To affected area  3  . ibuprofen (ADVIL,MOTRIN) 200 MG tablet Take 400-600 mg by mouth every 6 (six) hours as needed for fever, headache, mild pain, moderate pain or cramping.     . loratadine (CLARITIN) 10 MG tablet Take 10 mg by mouth daily.      No current facility-administered medications for this visit.    PHYSICAL EXAMINATION: ECOG PERFORMANCE STATUS: 0 - Asymptomatic  Filed Vitals:   02/06/16 1459  BP: 116/75  Pulse: 95  Temp: 98 F (36.7 C)  Resp: 18   Filed Weights   02/06/16 1459  Weight: 262 lb 14.4 oz (119.251 kg)  GENERAL:alert, no distress and comfortable SKIN: skin color, texture, turgor are normal, no rashes or significant lesions EYES: normal, Conjunctiva are pink and non-injected, sclera clear Musculoskeletal:no cyanosis of digits and no  clubbing  NEURO: alert & oriented x 3 with fluent speech, no focal motor/sensory deficits  LABORATORY DATA:  I have reviewed the data as listed    Component Value Date/Time   NA 139 02/05/2016 1354   NA 138 09/09/2013 0940   K 4.2 02/05/2016 1354   K 4.1 09/09/2013 0940   CL 105 09/09/2013 0940   CO2 26 02/05/2016 1354   CO2 24 09/09/2013 0940   GLUCOSE 85 02/05/2016 1354   GLUCOSE 88 09/09/2013 0940   BUN 9.5 02/05/2016 1354   BUN 15 09/09/2013 0940   CREATININE 0.8 02/05/2016 1354   CREATININE 0.71 09/09/2013 0940   CALCIUM 9.3 02/05/2016 1354   CALCIUM 9.0 09/09/2013 0940   PROT 7.1 02/05/2016 1354   PROT 6.7 08/15/2013 0515   ALBUMIN 4.0 02/05/2016 1354   ALBUMIN 3.0* 08/15/2013 0515   AST 12 02/05/2016 1354   AST 8 08/15/2013 0515   ALT 17 02/05/2016 1354   ALT 16 08/15/2013 0515   ALKPHOS 85 02/05/2016 1354   ALKPHOS 85 08/15/2013 0515   BILITOT 0.38 02/05/2016 1354   BILITOT 0.1* 08/15/2013 0515   GFRNONAA >90 09/09/2013 0940   GFRAA >90 09/09/2013 0940    No results found for: SPEP, UPEP  Lab Results  Component Value Date   WBC 5.9 02/05/2016   NEUTROABS 3.8 02/05/2016   HGB 12.4 02/05/2016   HCT 38.7 02/05/2016   MCV 83.4 02/05/2016   PLT 251 02/05/2016      Chemistry      Component Value Date/Time   NA 139 02/05/2016 1354   NA 138 09/09/2013 0940   K 4.2 02/05/2016 1354   K 4.1 09/09/2013 0940   CL 105 09/09/2013 0940   CO2 26 02/05/2016 1354   CO2 24 09/09/2013 0940   BUN 9.5 02/05/2016 1354   BUN 15 09/09/2013 0940   CREATININE 0.8 02/05/2016 1354   CREATININE 0.71 09/09/2013 0940      Component Value Date/Time   CALCIUM 9.3 02/05/2016 1354   CALCIUM 9.0 09/09/2013 0940   ALKPHOS 85 02/05/2016 1354   ALKPHOS 85 08/15/2013 0515   AST 12 02/05/2016 1354   AST 8 08/15/2013 0515   ALT 17 02/05/2016 1354   ALT 16 08/15/2013 0515   BILITOT 0.38 02/05/2016 1354   BILITOT 0.1* 08/15/2013 0515       RADIOGRAPHIC STUDIES: I have  personally reviewed the radiological images as listed and agreed with the findings in the report. Ct Chest W Contrast  02/05/2016  CLINICAL DATA:  Hodgkin lymphoma, chemotherapy complete. EXAM: CT CHEST WITH CONTRAST TECHNIQUE: Multidetector CT imaging of the chest was performed during intravenous contrast administration. CONTRAST:  49mL ISOVUE-300 IOPAMIDOL (ISOVUE-300) INJECTION 61% COMPARISON:  PET 09/11/2015 and CT chest 11/22/2013. FINDINGS: Mediastinum/Nodes: Noncalcified prevascular lymph node measures approximately 1.8 cm, stable from 09/11/2015. Calcified prevascular mass measures 3.5 cm in long axis, stable as well. No new mediastinal, hilar or axillary lymph nodes. Heart size normal. No pericardial effusion. Lungs/Pleura: Minimal dependent atelectasis bilaterally. Lungs are otherwise clear. No pleural fluid. Airway is unremarkable. Upper abdomen: Visualized portions of the liver, gallbladder, adrenal glands, kidneys, spleen, pancreas, stomach and bowel are grossly unremarkable. Musculoskeletal: No worrisome lytic or sclerotic lesions. IMPRESSION: Noncalcified prevascular lymph node is stable from 09/11/2015. No new adenopathy. Electronically  Signed   By: Lorin Picket M.D.   On: 02/05/2016 16:22     ASSESSMENT & PLAN:  History of hodgkin's lymphoma  I have reviewed her case at that hematology tumor board today. 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 CT showed stable appearance Recent surgical resection in March 2016 at [facility] showed benign pathology. She is asymptomatic. We discussed cancer survivorship care plan and current recommendations from NCCN. I recommend transitioning her care to cancer survivorship clinic and she agreed  Quality of life palliative care encounter We discussed increase physical activity and possible enrollment with the LiveStrong program with the [facility]. I gave her additional resources from the [facility]. We discussed  importance of vitamin D supplementation.    Orders Placed This Encounter  Procedures  . CBC with Differential/Platelet    Standing Status: Future     Number of Occurrences:      Standing Expiration Date: 03/12/2017  . Comprehensive metabolic panel    Standing Status: Future     Number of Occurrences:      Standing Expiration Date: 03/12/2017  . Amb Referral to Survivorship Long term    Referral Priority:  Routine    Referral Type:  Consultation    Number of Visits Requested:  1   All questions were answered. The patient knows to call the clinic with any problems, questions or concerns. No barriers to learning was detected. I spent 15 minutes counseling the patient face to face. The total time spent in the appointment was 20 minutes and more than 50% was on counseling and review of test results     [facility], [place], MD 02/06/2016 4:01 PM

## 2016-06-18 ENCOUNTER — Telehealth: Payer: Self-pay | Admitting: *Deleted

## 2016-06-18 NOTE — Telephone Encounter (Signed)
Pt left VM states she has not been scheduled to see Elzie Rings yet in November.  She is asking when her appt will be made?

## 2016-06-18 NOTE — Telephone Encounter (Signed)
Yes please

## 2016-06-18 NOTE — Telephone Encounter (Signed)
Scheduling message sent for pt to be seen in Survivorship Clinic in November.  Notified pt of request sent.  She should be expecting call from Scheduling dept. Pt verbalized understanding.

## 2016-06-21 ENCOUNTER — Telehealth: Payer: Self-pay | Admitting: Hematology and Oncology

## 2016-06-21 NOTE — Telephone Encounter (Signed)
appt made per LOS and letter sent by mail 9/29

## 2016-06-25 IMAGING — CT CT CHEST W/ CM
2 of 3 series · 15 of 36 positions shown, 18 images · IV contrast (iopamidol)
Comparison: PET 09/11/2015 and CT chest 11/22/2013.

CLINICAL DATA: Hodgkin lymphoma, chemotherapy complete.

EXAM:
CT CHEST WITH CONTRAST
TECHNIQUE: Multidetector CT imaging of the chest was performed during
intravenous contrast administration.
CONTRAST:  75mL Q3A7D8-Q11 IOPAMIDOL (Q3A7D8-Q11) INJECTION 61%

[Series 2: chest with st · axial · 0.74mm/px · z∈[-270,-20]mm · 12 of 99 slices shown, 15 images]
[im 8/99  mediastinal]
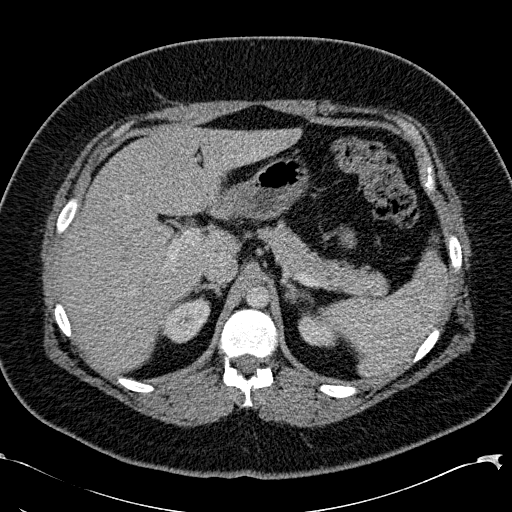
[im 8/99  lung]
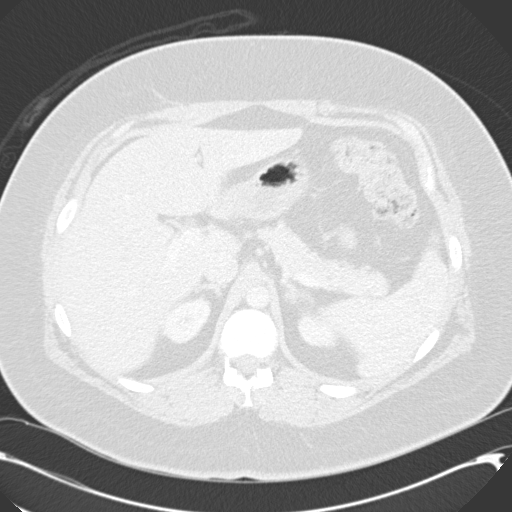
[im 15/99  lung]
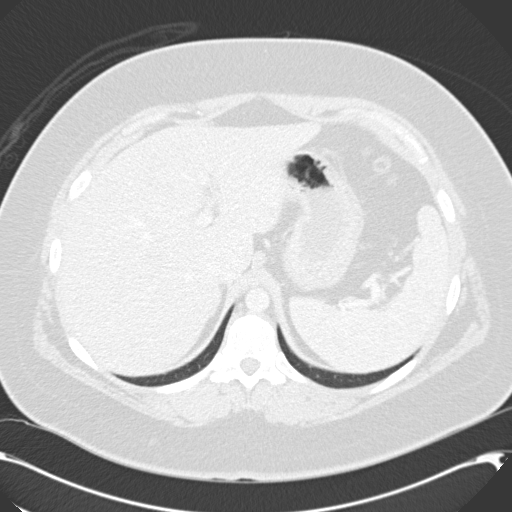
[im 22/99  lung]
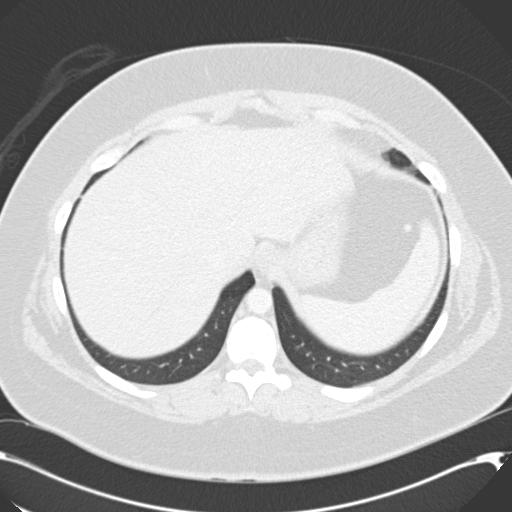
[im 30/99  lung]
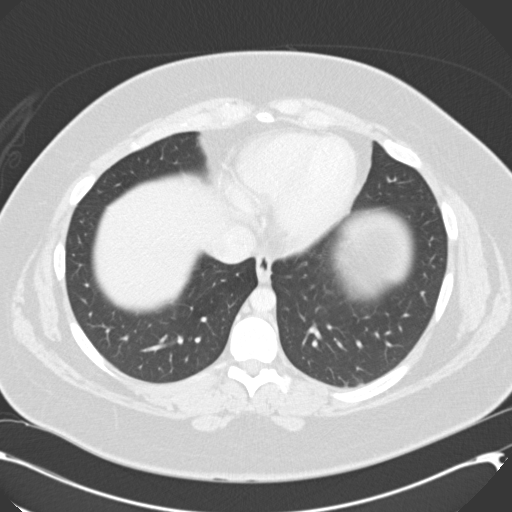
[im 37/99  mediastinal]
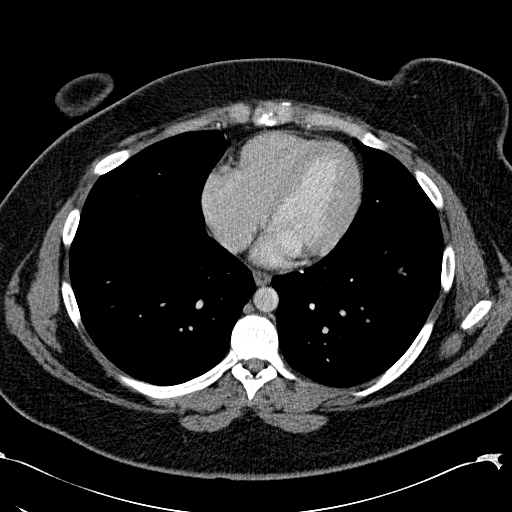
[im 37/99  lung]
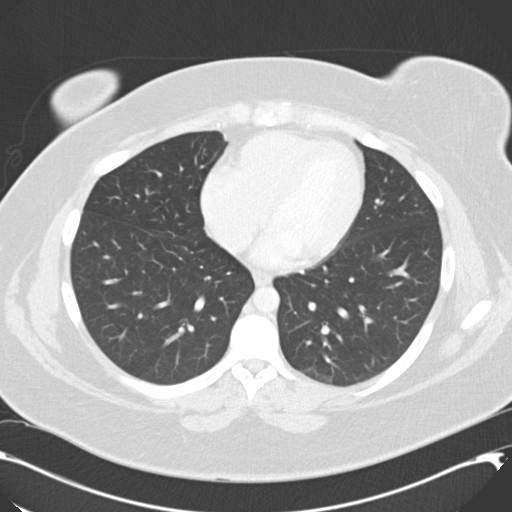
[im 44/99  lung]
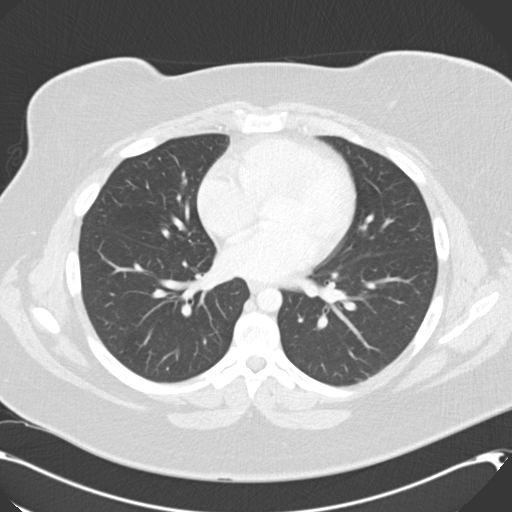
[im 55/99  lung]
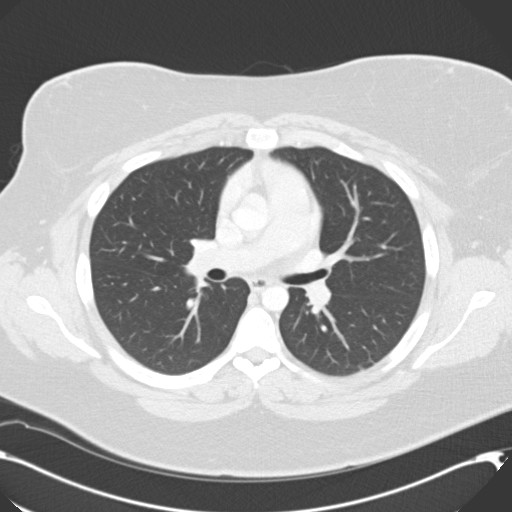
[im 62/99  lung]
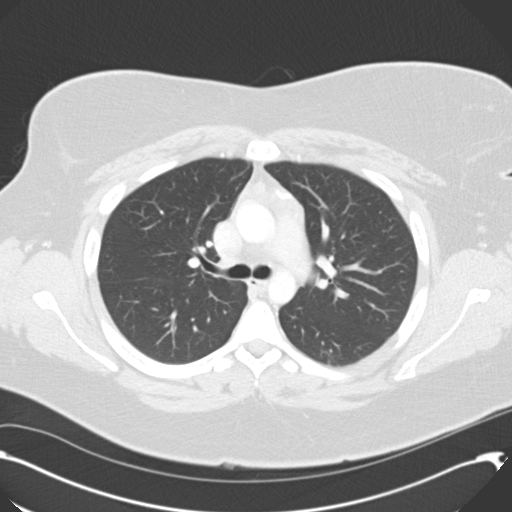
[im 69/99  mediastinal]
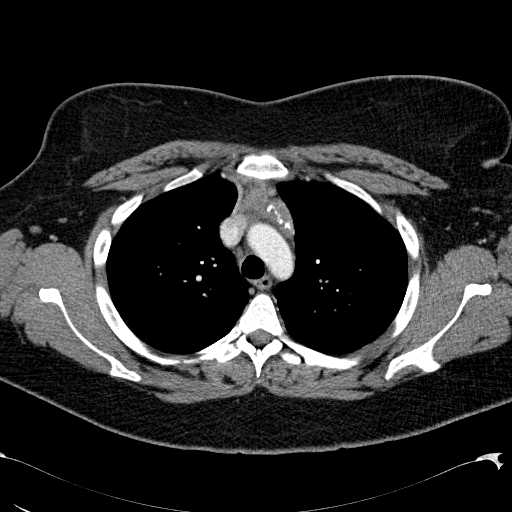
[im 69/99  lung]
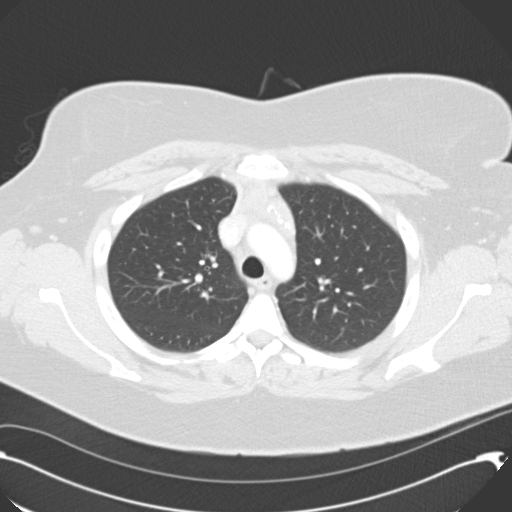
[im 77/99  lung]
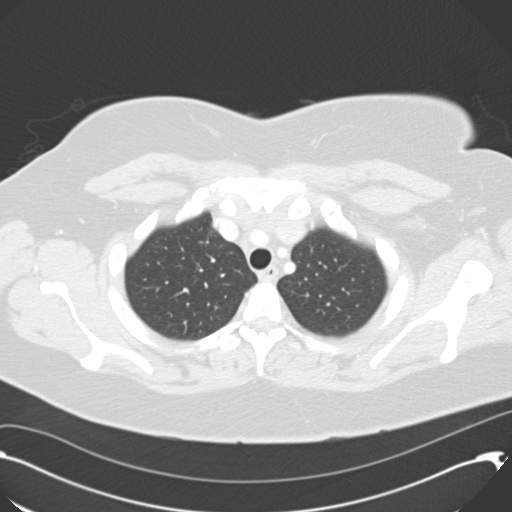
[im 84/99  lung]
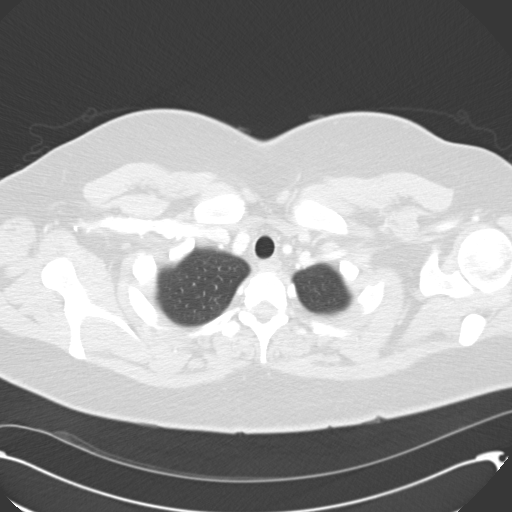
[im 91/99  lung]
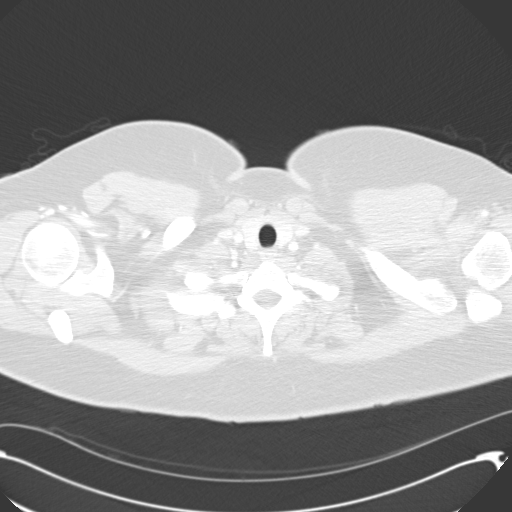

[Series 602: coronal images · coronal · 0.74mm/px · 3 of 127 slices shown]
[im 26/127  lung]
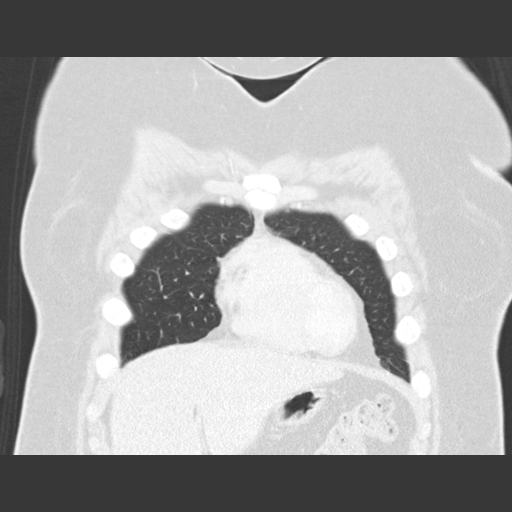
[im 51/127  lung]
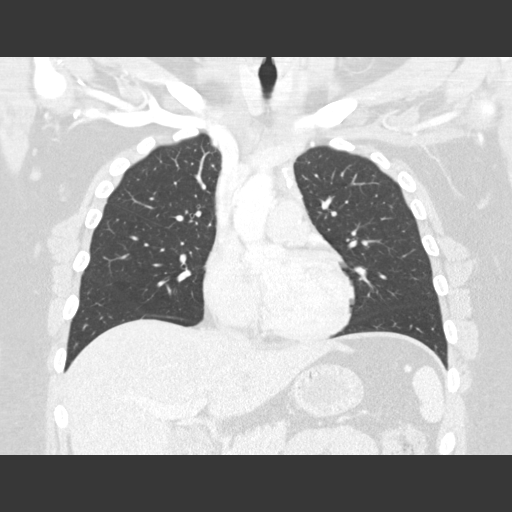
[im 76/127  lung]
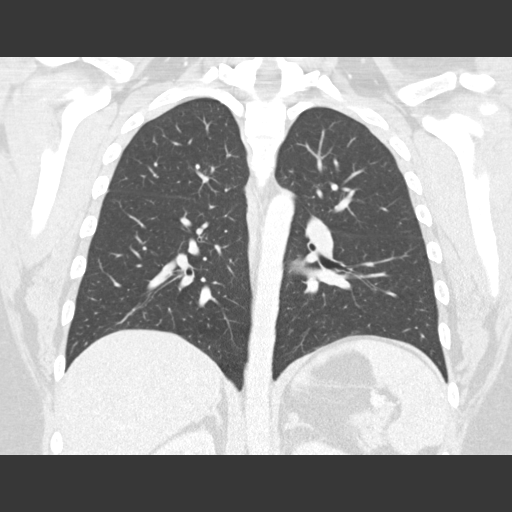

[15 of 36 positions shown; findings below may reference images not displayed]

FINDINGS: Mediastinum/Nodes: Noncalcified prevascular lymph node measures
approximately 1.8 cm, stable from 09/11/2015. Calcified prevascular
mass measures 3.5 cm in long axis, stable as well. No new
mediastinal, hilar or axillary lymph nodes. Heart size normal. No
pericardial effusion.

Lungs/Pleura: Minimal dependent atelectasis bilaterally. Lungs are
otherwise clear. No pleural fluid. Airway is unremarkable.

Upper abdomen: Visualized portions of the liver, gallbladder,
adrenal glands, kidneys, spleen, pancreas, stomach and bowel are
grossly unremarkable.

Musculoskeletal: No worrisome lytic or sclerotic lesions.
IMPRESSION: Noncalcified prevascular lymph node is stable from 09/11/2015. No
new adenopathy.

## 2016-07-08 ENCOUNTER — Encounter: Payer: Self-pay | Admitting: *Deleted

## 2016-08-16 ENCOUNTER — Encounter: Payer: Self-pay | Admitting: Adult Health

## 2016-08-16 ENCOUNTER — Ambulatory Visit (HOSPITAL_BASED_OUTPATIENT_CLINIC_OR_DEPARTMENT_OTHER): Payer: 59 | Admitting: Adult Health

## 2016-08-16 ENCOUNTER — Other Ambulatory Visit (HOSPITAL_BASED_OUTPATIENT_CLINIC_OR_DEPARTMENT_OTHER): Payer: 59

## 2016-08-16 VITALS — BP 136/70 | HR 75 | Temp 98.3°F | Resp 20 | Ht 70.0 in | Wt 264.0 lb

## 2016-08-16 DIAGNOSIS — Z8571 Personal history of Hodgkin lymphoma: Secondary | ICD-10-CM

## 2016-08-16 DIAGNOSIS — C8110 Nodular sclerosis classical Hodgkin lymphoma, unspecified site: Secondary | ICD-10-CM

## 2016-08-16 LAB — CBC WITH DIFFERENTIAL/PLATELET
BASO%: 0.5 % (ref 0.0–2.0)
BASOS ABS: 0 10*3/uL (ref 0.0–0.1)
EOS%: 0.9 % (ref 0.0–7.0)
Eosinophils Absolute: 0.1 10*3/uL (ref 0.0–0.5)
HEMATOCRIT: 38.7 % (ref 34.8–46.6)
HGB: 12.5 g/dL (ref 11.6–15.9)
LYMPH#: 1.9 10*3/uL (ref 0.9–3.3)
LYMPH%: 33.2 % (ref 14.0–49.7)
MCH: 27.1 pg (ref 25.1–34.0)
MCHC: 32.3 g/dL (ref 31.5–36.0)
MCV: 83.8 fL (ref 79.5–101.0)
MONO#: 0.3 10*3/uL (ref 0.1–0.9)
MONO%: 5.9 % (ref 0.0–14.0)
NEUT#: 3.4 10*3/uL (ref 1.5–6.5)
NEUT%: 59.5 % (ref 38.4–76.8)
Platelets: 265 10*3/uL (ref 145–400)
RBC: 4.62 10*6/uL (ref 3.70–5.45)
RDW: 13.2 % (ref 11.2–14.5)
WBC: 5.6 10*3/uL (ref 3.9–10.3)

## 2016-08-16 LAB — COMPREHENSIVE METABOLIC PANEL
ALT: 13 U/L (ref 0–55)
ANION GAP: 9 meq/L (ref 3–11)
AST: 10 U/L (ref 5–34)
Albumin: 3.6 g/dL (ref 3.5–5.0)
Alkaline Phosphatase: 97 U/L (ref 40–150)
BUN: 12.3 mg/dL (ref 7.0–26.0)
CHLORIDE: 108 meq/L (ref 98–109)
CO2: 24 meq/L (ref 22–29)
Calcium: 9.4 mg/dL (ref 8.4–10.4)
Creatinine: 0.9 mg/dL (ref 0.6–1.1)
EGFR: >90 mL/min/{1.73_m2} (ref >90)
Glucose: 85 mg/dl (ref 70–140)
POTASSIUM: 4.4 meq/L (ref 3.5–5.1)
Sodium: 140 mEq/L (ref 136–145)
Total Bilirubin: 0.28 mg/dL (ref 0.20–1.20)
Total Protein: 7.1 g/dL (ref 6.4–8.3)

## 2016-08-16 LAB — LACTATE DEHYDROGENASE: LDH: 164 U/L (ref 125–245)

## 2016-08-16 NOTE — Progress Notes (Signed)
CLINIC:  Survivorship   REASON FOR VISIT:  Routine follow-up for history of Hodgkin lymphoma    BRIEF ONCOLOGIC HISTORY:  Oncology History   Hodgkin's lymphoma, nodular sclerosis   Primary site: Lymphoid Neoplasms   Staging method: AJCC 6th Edition   Clinical: Stage III signed by Heath Lark, MD on 12/29/2013  8:11 PM   Pathologic: Stage III signed by Heath Lark, MD on 12/29/2013  8:11 PM   Summary: Stage III       History of Hodgkin's lymphoma   07/30/2013 Imaging    CT scans show disease on both sides of the diaphragm with splenic involvement.      08/09/2013 Surgery    Mediastinoscopy and biopsy confirmed classical Hodgkin lymphoma      08/14/2013 - 01/31/2014 Chemotherapy    Patient was started on treatment with ABVD, completed 6 cycles of treatment.      11/22/2013 Imaging    Repeat staging CT scans showed marked response to treatment.      03/01/2014 Imaging    PET CT scan show complete remission.      08/29/2014 Imaging    RepeaT PET CT scan showed regression in the size of LN but with mild increased hypermetabolic activity.      10/24/2014 Imaging    Repeat PET/CT scan show persistent hypermetabolic activity in the mediastinal lymph node.      12/13/2014 Surgery    She had excision of mediastinal mass with negative cancer recurrence      03/13/2015 Imaging    PET CT scan showed stable lymphadenopathy      03/17/2015 Procedure    Port is removed      09/11/2015 Imaging    PET CT scan showed stable lymphadenopathy      02/05/2016 Imaging    CT chest showed stable lymphadenopathy       INTERVAL HISTORY:  Miranda Villegas presents to the [facility] today for routine follow-up for her history of Hodgkin lymphoma  Overall, she reports feeling quite well.  Denies any fatigue, fevers, chills, or night sweats.   Denies shortness of breath or chest pain. She has not noticed any adenopathy to her neck, axilla, or groin.   She does have generalized  arthralgias, that have been present since she completed chemotherapy.  She started taking vitamin D, which has helped her quite a bit.  She wants to know if there is anything else she can try to help with the joint aches/pains.  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    She has regular menses monthly.  She is engaged and is planning to get married in July 2018.  She has questions about contraception options as a cancer survivor and would like to get my recommendations today.  She does not have a PCP nor a gynecologist.     REVIEW OF SYSTEMS:  Review of Systems  Constitutional: Negative.   HENT:         Seasonal allergies/sinus issues   Eyes: Negative.   Respiratory: Negative.   Cardiovascular: Negative.   Gastrointestinal: Negative.   Endocrine: Negative.   Genitourinary: Negative.    Musculoskeletal: Positive for arthralgias.  Skin: Negative.   Neurological: Negative.   Hematological: Negative.  Negative for adenopathy.  Psychiatric/Behavioral: Negative.   Breast: Denies any nodularity, masses.   A 14-point review of systems was completed and was negative, except as noted above.    PAST MEDICAL/SURGICAL  HISTORY:  Past Medical History:  Diagnosis Date  . Anxiety   . Depression    as a 25 year old  . Fungal skin infection 09/01/2014  . hodgkins lymphoma 08/12/13   lymphoma recently dx  . Mass in neck    right side    Past Surgical History:  Procedure Laterality Date  . LYMPH NODE BIOPSY  08/09/13   medial neck area - lymphoma dx.  . MEDIASTINOSCOPY N/A 08/09/2013   Procedure: MEDIASTINOSCOPY;  Surgeon: Melrose Nakayama, MD;  Location: [facility] OR;  Service: Thoracic;  Laterality: N/A;  . TOOTH EXTRACTION       ALLERGIES:  Allergies  Allergen Reactions  . Adhesive [Tape] Rash     CURRENT MEDICATIONS:  Outpatient Encounter Prescriptions as of 08/16/2016  Medication Sig Note  . ACZONE 5 % topical gel  Apply 5 each topically 2 (two) times daily. 10/25/2014: Received from: External Pharmacy Received Sig:   . adapalene (DIFFERIN) 0.1 % cream Apply 0.1 each topically at bedtime. To affected area 10/25/2014: Received from: External Pharmacy Received Sig:   . ibuprofen (ADVIL,MOTRIN) 200 MG tablet Take 400-600 mg by mouth every 6 (six) hours as needed for fever, headache, mild pain, moderate pain or cramping.    . loratadine (CLARITIN) 10 MG tablet Take 10 mg by mouth daily.     No facility-administered encounter medications on file as of 08/16/2016.      ONCOLOGIC FAMILY HISTORY:  Family History  Problem Relation Age of Onset  . Alzheimer's disease    . Osteoarthritis Mother   . Heart disease    . Hearing loss    . Migraines Mother   . Parkinsonism    . Breast cancer    . Transient ischemic attack    . Autoimmune disease        SOCIAL HISTORY:  Jalei Stamler is currently single; she is engaged to be married in July 2018.  Her fiance works for [employer].  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  She has no children.  She has 1 younger sister who is 29 years old.  Miranda Villegas currently lives in [place] with her father.  She is planning to move with her future husband this summer to [place]; they recently put an offer in on a house there.  Her fiance works for [employer].  Denies any current tobacco, alcohol, or illicit drug use.    PHYSICAL EXAMINATION:  Vital Signs: Vitals:   08/16/16 1325  BP: 136/70  Pulse: 75  Resp: 20  Temp: 98.3 F (36.8 C)   Filed Weights   08/16/16 1325  Weight: 264 lb (119.7 kg)   General: Well-nourished, well-appearing female in no acute distress. Unaccompanied today. HEENT: Head is normocephalic.  Pupils equal and reactive to light. Conjunctivae clear without exudate.  Sclerae anicteric. Oral mucosa is pink, moist.  Oropharynx is pink without lesions or erythema.  Lymph: No  cervical, supraclavicular, infraclavicular, axillary, or inguinal lymphadenopathy noted on palpation.  Cardiovascular: Regular rate and rhythm. Respiratory: Clear to auscultation bilaterally. Chest expansion symmetric; breathing non-labored.  GI: Abdomen soft and round; non-tender, non-distended. Bowel sounds normoactive. No hepatosplenomegaly.   GU: Deferred.  Neuro: No focal deficits. Steady gait.  Psych: Mood and affect normal and appropriate for situation.  Extremities: No edema. Skin: Warm and dry.   LABORATORY DATA:  CBC Latest Ref Rng & Units 08/16/2016 02/05/2016 09/11/2015  WBC 3.9 - 10.3 10e3/uL  5.6 5.9 5.0  Hemoglobin 11.6 - 15.9 g/dL 12.5 12.4 13.1  Hematocrit 34.8 - 46.6 % 38.7 38.7 41.1  Platelets 145 - 400 10e3/uL 265 251 265   CMP Latest Ref Rng & Units 08/16/2016 02/05/2016 09/11/2015  Glucose 70 - 140 mg/dl 85 85 92  BUN 7.0 - 26.0 mg/dL 12.3 9.5 12.5  Creatinine 0.6 - 1.1 mg/dL 0.9 0.8 0.8  Sodium 136 - 145 mEq/L 140 139 137  Potassium 3.5 - 5.1 mEq/L 4.4 4.2 4.2  Chloride 96 - 112 mEq/L - - -  CO2 22 - 29 mEq/L 24 26 24   Calcium 8.4 - 10.4 mg/dL 9.4 9.3 9.4  Total Protein 6.4 - 8.3 g/dL 7.1 7.1 7.3  Total Bilirubin 0.20 - 1.20 mg/dL 0.28 0.38 0.47  Alkaline Phos 40 - 150 U/L 97 85 93  AST 5 - 34 U/L 10 12 11   ALT 0 - 55 U/L 13 17 14     Results for ELEYAH, HANEL (MRN [id number]) as of 08/16/2016  Ref. Range 08/16/2016 13:15  LDH Latest Ref Range: 125 - 245 U/L 164    *Labs reviewed with patient and are within normal limits.    DIAGNOSTIC IMAGING:  Last CT chest: 02/05/16      ASSESSMENT AND PLAN:  Ms.. Miranda Villegas is a very pleasant 25 y.o. female with history of Stage III Hodgkin Lymphoma, diagnosed in 07/2013 after presenting with neck mass, shortness of breath, fevers, chills, night sweats, fatigue, and 15-lb weight loss.  Imaging revealed mediastinal mass which was (+) for classic Hodgkin lymphoma.  She started treatment with ABVD  (Adriamycinn/Bleomycin/Vinblastine/Dacarbazine) chemotherapy x 6 cycles.  PET scan after chemo showed complete response.  In 08/2014 & 10/2014 on subsequent imaging, she was found to have persistent hypermetabolic activity in mediastinal lymph node.  On 12/13/14, she underwent mediastinal mass excision.  Subsequent imaging has shown stable lymphadenopathy.  Most recent CT chest 02/05/16 with stable lymphadenopathy. She was released to Survivorship Clinic follow-up by Dr. Alvy Bimler in 01/2016.   MirandaLindow presents to the Survivorship Clinic for surveillance and routine follow-up.    1. History of Stage III mediastinal Hodgkin lymphoma:  Miranda Villegas is currently clinically without evidence of disease or recurrence of lymphoma.  We discussed that her imaging has been stable since her mediastinal excision in 11/2014.  The CT chest from 02/05/16 was reviewed by the Hematologic Oncology Tumor Board and the stable lymphadenopathy noted on imaging is thought to be reactive thymus.  I discussed with Miranda Villegas that I do not think it is necessary to do further surveillance imaging at this point, which proven lymphadenopathy stability for greater than 1 year.  Certainly, if she has new symptoms in the future that warrant additional imaging, we will order them at that time. She agreed with this plan. She will follow-up in the Survivorship Clinic in 1 year with labs, history, and physical exam per surveillance protocol.  I encouraged her to call me with any questions or concerns before her next visit at the cancer center, and I would be happy to see the patient sooner, if needed.   Please see NCCN Guidelines for follow-up recommendations for Hodgkin Lymphoma survivors.      2. Contraceptive counseling:  Given her age, she is eligible for several contraceptive options including oral contraceptive pills, IUD, injections, etc.  From a cancer standpoint, any of these options would likely be safe for her.  She currently is not  sexually active and is waiting until her wedding in  July 2018, but I did encourage her to find a gynecologist for a routine pelvic exam and to discuss her contraceptive options prior to her wedding date.  She does not want to get pregnant right away after getting married, so I encouraged her to let us know if she needs our help with placing a referral to gynecology.  She is going to check with her mom and sister, as they both have a gynecologist that they like.  I encouraged her to call me with any questions or concerns.   3. Arthralgias:  Her generalized arthralgias could certainly be secondary to chemotherapy.  She is looking for additional options to help manage her joint aches/pains.  She is already taking vitamin D, which has been helpful for her.  She asked about taking a tumeric supplement, which I think is reasonable.  There is some decent data that tumeric helps reduce inflammation and also carries quite a bit of anti-oxidant benefits as well.  Of course, she can also take OTC NSAIDs as needed as well.    4. Cancer screening:  Due to Ms. Lindow's history and age, she should receive screening for skin cancers and gynecologic cancers. The patient was encouraged to follow-up with her PCP for appropriate cancer screenings.  We discussed that because she did not require mediastinal radiation therapy, she does not require a mammogram earlier than age 46.  She has already received her annual flu vaccine for this year (in 06/2016).    5. Health maintenance and wellness promotion: Miranda Villegas was encouraged to consume 5-7 servings of fruits and vegetables per day. The patient was also encouraged to engage in moderate to vigorous exercise for 30 minutes per day most days of the week. We discussed the LiveStrong program through the [facility], which is a fitness program designed for cancer survivors.  She was given a brochure with instructions on who to contact if she chooses to enroll in [place].  Miranda Villegas was  instructed to limit her alcohol consumption and continue to abstain from tobacco use.    Dispo:  -Return to cancer center to see Survivorship NP in 01/2017 with labs before visit.    A total of 30 minutes of face-to-face time was spent with this patient with greater than 50% of that time in counseling and care-coordination.   Mike Craze, NP Survivorship Program [place] [phone]   Note: PRIMARY CARE PROVIDER Berkley Harvey, [place] [phone]

## 2016-10-30 ENCOUNTER — Telehealth: Payer: Self-pay | Admitting: Hematology and Oncology

## 2016-10-30 NOTE — Telephone Encounter (Signed)
FAXED RECORDS TO [facility] RELEASE ID [id number]

## 2017-01-31 ENCOUNTER — Telehealth: Payer: Self-pay | Admitting: *Deleted

## 2017-01-31 ENCOUNTER — Telehealth: Payer: Self-pay | Admitting: Adult Health

## 2017-01-31 NOTE — Telephone Encounter (Signed)
Patient called to reschedule appointment with Mendel Ryder, NP. Long term survivorship appointment for 02/27/17 at 0830 am. Patient verbalized understanding. A scheduling message sent.

## 2017-01-31 NOTE — Telephone Encounter (Signed)
Patient called and was upset there her appointment has been moved 3 times and she is unable to make the appointment on Monday she would like a May appointment due to having an appointment for 6 months.

## 2017-02-03 ENCOUNTER — Other Ambulatory Visit: Payer: 59

## 2017-02-03 ENCOUNTER — Encounter: Payer: 59 | Admitting: Adult Health

## 2017-02-03 NOTE — Telephone Encounter (Signed)
Pt called today requesting to be scheduled for survivorship visit sooner than 6/7. She is uncomfortable waiting that long to be seen. She can come in almost any day but Tuesday. Appt given for 5/16 @ 0900/0930

## 2017-02-04 ENCOUNTER — Telehealth: Payer: Self-pay

## 2017-02-04 NOTE — Telephone Encounter (Signed)
Received call from pt stating that she kept getting schedule changes from MyChart message and wanted to call in to clarify her appts. Pt states that she is getting frustrated with her schedule being change 5-6x. Pt is currently scheduled in June. Pt states that she is unable to come to appt that day. Scheduled pt to see LC,NP tomorrow at 9am(lab) and 930 (LC). Pt verbally confirmed that she is able to come in tomorrow to work out those appts. Advised pt that there will be no schedule change anymore, unless we call her or she calls the cancer center to change her appt. Apologized for the miscommunication error. Will find out why she is getting rescheduled multiple times.Pt verbalized understanding and has no further questions at this time.

## 2017-02-05 ENCOUNTER — Other Ambulatory Visit (HOSPITAL_BASED_OUTPATIENT_CLINIC_OR_DEPARTMENT_OTHER): Payer: 59

## 2017-02-05 ENCOUNTER — Ambulatory Visit: Payer: 59 | Admitting: Adult Health

## 2017-02-05 ENCOUNTER — Encounter: Payer: 59 | Admitting: Adult Health

## 2017-02-05 ENCOUNTER — Ambulatory Visit (HOSPITAL_BASED_OUTPATIENT_CLINIC_OR_DEPARTMENT_OTHER): Payer: 59 | Admitting: Adult Health

## 2017-02-05 ENCOUNTER — Encounter: Payer: Self-pay | Admitting: Adult Health

## 2017-02-05 ENCOUNTER — Other Ambulatory Visit: Payer: 59

## 2017-02-05 VITALS — BP 127/66 | HR 77 | Temp 98.3°F | Resp 20 | Ht 70.0 in | Wt 260.1 lb

## 2017-02-05 DIAGNOSIS — Z8571 Personal history of Hodgkin lymphoma: Secondary | ICD-10-CM

## 2017-02-05 DIAGNOSIS — C8110 Nodular sclerosis classical Hodgkin lymphoma, unspecified site: Secondary | ICD-10-CM

## 2017-02-05 LAB — CBC WITH DIFFERENTIAL/PLATELET
BASO%: 0.8 % (ref 0.0–2.0)
BASOS ABS: 0 10*3/uL (ref 0.0–0.1)
EOS%: 0.7 % (ref 0.0–7.0)
Eosinophils Absolute: 0 10*3/uL (ref 0.0–0.5)
HEMATOCRIT: 39.9 % (ref 34.8–46.6)
HGB: 13 g/dL (ref 11.6–15.9)
LYMPH%: 29.8 % (ref 14.0–49.7)
MCH: 27.2 pg (ref 25.1–34.0)
MCHC: 32.7 g/dL (ref 31.5–36.0)
MCV: 83.2 fL (ref 79.5–101.0)
MONO#: 0.2 10*3/uL (ref 0.1–0.9)
MONO%: 5.1 % (ref 0.0–14.0)
NEUT#: 3.1 10*3/uL (ref 1.5–6.5)
NEUT%: 63.6 % (ref 38.4–76.8)
PLATELETS: 264 10*3/uL (ref 145–400)
RBC: 4.79 10*6/uL (ref 3.70–5.45)
RDW: 13.9 % (ref 11.2–14.5)
WBC: 4.9 10*3/uL (ref 3.9–10.3)
lymph#: 1.5 10*3/uL (ref 0.9–3.3)

## 2017-02-05 LAB — COMPREHENSIVE METABOLIC PANEL
ALT: 17 U/L (ref 0–55)
ANION GAP: 10 meq/L (ref 3–11)
AST: 13 U/L (ref 5–34)
Albumin: 3.8 g/dL (ref 3.5–5.0)
Alkaline Phosphatase: 66 U/L (ref 40–150)
BUN: 12.4 mg/dL (ref 7.0–26.0)
CALCIUM: 9.7 mg/dL (ref 8.4–10.4)
CO2: 25 mEq/L (ref 22–29)
CREATININE: 0.8 mg/dL (ref 0.6–1.1)
Chloride: 107 mEq/L (ref 98–109)
EGFR: >90 mL/min/{1.73_m2} (ref >90)
Glucose: 93 mg/dl (ref 70–140)
POTASSIUM: 4.3 meq/L (ref 3.5–5.1)
Sodium: 141 mEq/L (ref 136–145)
Total Bilirubin: 0.35 mg/dL (ref 0.20–1.20)
Total Protein: 7.3 g/dL (ref 6.4–8.3)

## 2017-02-05 LAB — LACTATE DEHYDROGENASE: LDH: 165 U/L (ref 125–245)

## 2017-02-05 NOTE — Progress Notes (Signed)
CLINIC:  Survivorship   REASON FOR VISIT:  Routine follow-up for history of Hodgkin Lymphoma.   BRIEF ONCOLOGIC HISTORY:  Oncology History   Hodgkin's lymphoma, nodular sclerosis   Primary site: Lymphoid Neoplasms   Staging method: AJCC 6th Edition   Clinical: Stage III signed by Heath Lark, MD on 12/29/2013  8:11 PM   Pathologic: Stage III signed by Heath Lark, MD on 12/29/2013  8:11 PM   Summary: Stage III       History of Hodgkin's lymphoma   07/30/2013 Imaging    CT scans show disease on both sides of the diaphragm with splenic involvement.      08/09/2013 Surgery    Mediastinoscopy and biopsy confirmed classical Hodgkin lymphoma      08/14/2013 - 01/31/2014 Chemotherapy    Patient was started on treatment with ABVD, completed 6 cycles of treatment.      11/22/2013 Imaging    Repeat staging CT scans showed marked response to treatment.      03/01/2014 Imaging    PET CT scan show complete remission.      08/29/2014 Imaging    RepeaT PET CT scan showed regression in the size of LN but with mild increased hypermetabolic activity.      10/24/2014 Imaging    Repeat PET/CT scan show persistent hypermetabolic activity in the mediastinal lymph node.      12/13/2014 Surgery    She had excision of mediastinal mass with negative cancer recurrence      03/13/2015 Imaging    PET CT scan showed stable lymphadenopathy      03/17/2015 Procedure    Port is removed      09/11/2015 Imaging    PET CT scan showed stable lymphadenopathy      02/05/2016 Imaging    CT chest showed stable lymphadenopathy       INTERVAL HISTORY:  [name] presents to the [facility] today for routine follow-up for her history of lymphoma.  Overall, she reports feeling quite well. She denies any issues.  She does not exercise.  She is working full time and is enjoying her work.      REVIEW OF SYSTEMS:  Review of Systems  Constitutional: Negative for appetite change, chills,  diaphoresis, fatigue, fever and unexpected weight change.  HENT:   Negative for hearing loss.   Eyes: Negative for eye problems and icterus.  Respiratory: Negative for chest tightness, cough, hemoptysis, shortness of breath and wheezing.   Cardiovascular: Negative for chest pain, leg swelling and palpitations.  Gastrointestinal: Negative for abdominal distention and abdominal pain.  Endocrine: Negative for hot flashes.  Genitourinary: Negative for difficulty urinating, dyspareunia, menstrual problem and vaginal discharge.   Musculoskeletal: Negative for arthralgias and gait problem.  Neurological: Negative for dizziness and gait problem.  Hematological: Negative for adenopathy. Does not bruise/bleed easily.  Psychiatric/Behavioral: Negative for depression and sleep disturbance. The patient is not nervous/anxious.   GU: Denies vaginal bleeding, discharge, or dryness.  Breast: Denies any new nodularity, masses, tenderness, nipple changes, or nipple discharge.        PAST MEDICAL/SURGICAL HISTORY:  Past Medical History:  Diagnosis Date  . Anxiety   . Depression    as a 26 year old  . Fungal skin infection 09/01/2014  . hodgkins lymphoma 08/12/13   lymphoma recently dx  . Mass in neck    right side    Past Surgical History:  Procedure Laterality Date  . LYMPH NODE BIOPSY  08/09/13   medial  neck area - lymphoma dx.  . MEDIASTINOSCOPY N/A 08/09/2013   Procedure: MEDIASTINOSCOPY;  Surgeon: Melrose Nakayama, MD;  Location: [facility];  Service: Thoracic;  Laterality: N/A;  . TOOTH EXTRACTION       ALLERGIES:  Allergies  Allergen Reactions  . Adhesive [Tape] Rash     CURRENT MEDICATIONS:  Outpatient Encounter Prescriptions as of 02/05/2017  Medication Sig Note  . cholecalciferol (VITAMIN D) 1000 units tablet Take 2,000 Units by mouth daily.   Marland Kitchen desogestrel-ethinyl estradiol (RECLIPSEN) 0.15-30 MG-MCG tablet Take 1 tablet by mouth daily.   Marland Kitchen ibuprofen (ADVIL,MOTRIN) 200 MG  tablet Take 400-600 mg by mouth every 6 (six) hours as needed for fever, headache, mild pain, moderate pain or cramping.    . loratadine (CLARITIN) 10 MG tablet Take 10 mg by mouth daily.    . vitamin B-12 (CYANOCOBALAMIN) 1000 MCG tablet Take 1,000 mcg by mouth daily.   Marland Kitchen ACZONE 5 % topical gel Apply 5 each topically 2 (two) times daily. 10/25/2014: Received from: External Pharmacy Received Sig:   . adapalene (DIFFERIN) 0.1 % cream Apply 0.1 each topically at bedtime. To affected area 10/25/2014: Received from: External Pharmacy Received Sig:    No facility-administered encounter medications on file as of 02/05/2017.      ONCOLOGIC FAMILY HISTORY:  Family History  Problem Relation Age of Onset  . Alzheimer's disease Unknown   . Osteoarthritis Mother   . Heart disease Unknown   . Hearing loss Unknown   . Migraines Mother   . Parkinsonism Unknown   . Breast cancer Unknown   . Transient ischemic attack Unknown   . Autoimmune disease Unknown       SOCIAL HISTORY:  Miranda Villegas is married and lives with her husband in [place], [place].  [name]  has no children.  Currently working full time as an Radio broadcast assistant at [employer].  Denies any current or history of tobacco, alcohol, or illicit drug use.    PHYSICAL EXAMINATION:  Vital Signs: Vitals:   02/05/17 0926  BP: 127/66  Pulse: 77  Resp: 20  Temp: 98.3 F (36.8 C)   Filed Weights   02/05/17 0926  Weight: 260 lb 1.6 oz (118 kg)   General: Well-nourished, well-appearing female in no acute distress. Accompanied/Unaccompanied today.HEENT: Head is normocephalic.  Pupils equal and reactive to light. Conjunctivae clear without exudate.  Sclerae anicteric. Oral mucosa is pink, moist.  Oropharynx is pink without lesions or erythema.  Lymph: No cervical, supraclavicular, infraclavicular, or axillary lymphadenopathy noted on palpation.  Cardiovascular: Regular rate and rhythm.Marland Kitchen Respiratory: Clear to  auscultation bilaterally. Chest expansion symmetric; breathing non-labored.  GI: Abdomen soft and round; non-tender, non-distended. Bowel sounds normoactive. No hepatosplenomegaly.   GU: Deferred.  Neuro: No focal deficits. Steady gait.  Psych: Mood and affect normal and appropriate for situation.  Extremities: No edema. Skin: Warm and dry.   LABORATORY DATA:  Appointment on 02/05/2017  Component Date Value Ref Range Status  . WBC 02/05/2017 4.9  3.9 - 10.3 10e3/uL Final  . NEUT# 02/05/2017 3.1  1.5 - 6.5 10e3/uL Final  . HGB 02/05/2017 13.0  11.6 - 15.9 g/dL Final  . HCT 02/05/2017 39.9  34.8 - 46.6 % Final  . Platelets 02/05/2017 264  145 - 400 10e3/uL Final  . MCV 02/05/2017 83.2  79.5 - 101.0 fL Final  . MCH 02/05/2017 27.2  25.1 - 34.0 pg Final  . MCHC 02/05/2017 32.7  31.5 - 36.0 g/dL Final  .  RBC 02/05/2017 4.79  3.70 - 5.45 10e6/uL Final  . RDW 02/05/2017 13.9  11.2 - 14.5 % Final  . lymph# 02/05/2017 1.5  0.9 - 3.3 10e3/uL Final  . MONO# 02/05/2017 0.2  0.1 - 0.9 10e3/uL Final  . Eosinophils Absolute 02/05/2017 0.0  0.0 - 0.5 10e3/uL Final  . Basophils Absolute 02/05/2017 0.0  0.0 - 0.1 10e3/uL Final  . NEUT% 02/05/2017 63.6  38.4 - 76.8 % Final  . LYMPH% 02/05/2017 29.8  14.0 - 49.7 % Final  . MONO% 02/05/2017 5.1  0.0 - 14.0 % Final  . EOS% 02/05/2017 0.7  0.0 - 7.0 % Final  . BASO% 02/05/2017 0.8  0.0 - 2.0 % Final    DIAGNOSTIC IMAGING:  None for this visit     ASSESSMENT AND PLAN:  Ms.. Miranda Villegas is a pleasant 26 y.o. female with history of Stage III Hodgkin lymphoma , diagnosed in 07/2013; treated with 6 cycles of ABVD and surgery. Ms.Lindow presents to the Survivorship Clinic for surveillance and routine follow-up.   1. History of lymphoma:  [name] is currently clinically and radiographically without evidence of disease or recurrence of Lymphoma. She will follow-up in the Survivorship Clinic in 1 year with labs, history, and physical exam per surveillance  protocol.  I encouraged her to call me with any questions or concerns before her next visit at the cancer center, and I would be happy to see the patient sooner, if needed.    2. Cancer screening:  Due to Ms. Lindow's history and age, she should receive screening for skin cancers, and gynecologic cancers.  She was concerned she may need to undergo mammography early, however, since she didn't receive radiation as part of her treatment, I assured her this was not the case.  She can start mammography and colon screening at age 57 and 89 respectively.  The patient was encouraged to follow-up with her PCP for appropriate cancer screenings.   3. Health maintenance and wellness promotion: [name] was encouraged to consume 5-7 servings of fruits and vegetables per day. The patient was also encouraged to engage in moderate to vigorous exercise for 30 minutes per day most days of the week. [name] was instructed to limit her alcohol consumption and continue to abstain from tobacco use.    Dispo:  -Return to cancer center to see Survivorship NP in one year   A total of (30) minutes of face-to-face time was spent with this patient with greater than 50% of that time in counseling and care-coordination.   Gardenia Phlegm, NP Survivorship Program [facility] [phone]   Note: PRIMARY CARE PROVIDER Berkley Harvey, [place] [phone]

## 2017-02-13 ENCOUNTER — Encounter: Payer: 59 | Admitting: Adult Health

## 2017-02-13 ENCOUNTER — Other Ambulatory Visit: Payer: 59

## 2017-02-27 ENCOUNTER — Other Ambulatory Visit: Payer: 59

## 2017-02-27 ENCOUNTER — Encounter: Payer: 59 | Admitting: Adult Health

## 2017-12-10 ENCOUNTER — Telehealth: Payer: Self-pay

## 2017-12-10 NOTE — Telephone Encounter (Signed)
Patient wanted to confirm her appointment and asked that it remain on this date and time. Per 3/20 phon message return

## 2018-02-09 ENCOUNTER — Other Ambulatory Visit: Payer: Self-pay | Admitting: Adult Health

## 2018-02-09 DIAGNOSIS — Z8571 Personal history of Hodgkin lymphoma: Secondary | ICD-10-CM

## 2018-02-09 DIAGNOSIS — C8110 Nodular sclerosis classical Hodgkin lymphoma, unspecified site: Secondary | ICD-10-CM

## 2018-02-10 ENCOUNTER — Telehealth: Payer: Self-pay | Admitting: Adult Health

## 2018-02-10 ENCOUNTER — Encounter: Payer: Self-pay | Admitting: Adult Health

## 2018-02-10 ENCOUNTER — Inpatient Hospital Stay: Payer: BLUE CROSS/BLUE SHIELD | Attending: Adult Health | Admitting: Adult Health

## 2018-02-10 ENCOUNTER — Inpatient Hospital Stay: Payer: BLUE CROSS/BLUE SHIELD

## 2018-02-10 VITALS — BP 127/81 | HR 77 | Temp 98.4°F | Resp 18 | Ht 70.0 in | Wt 269.0 lb

## 2018-02-10 DIAGNOSIS — K30 Functional dyspepsia: Secondary | ICD-10-CM | POA: Diagnosis not present

## 2018-02-10 DIAGNOSIS — K219 Gastro-esophageal reflux disease without esophagitis: Secondary | ICD-10-CM | POA: Insufficient documentation

## 2018-02-10 DIAGNOSIS — Z8571 Personal history of Hodgkin lymphoma: Secondary | ICD-10-CM | POA: Diagnosis not present

## 2018-02-10 DIAGNOSIS — C8110 Nodular sclerosis classical Hodgkin lymphoma, unspecified site: Secondary | ICD-10-CM

## 2018-02-10 LAB — CBC WITH DIFFERENTIAL (CANCER CENTER ONLY)
BASOS ABS: 0 10*3/uL (ref 0.0–0.1)
BASOS PCT: 1 %
EOS PCT: 1 %
Eosinophils Absolute: 0 10*3/uL (ref 0.0–0.5)
HCT: 40.2 % (ref 34.8–46.6)
Hemoglobin: 12.5 g/dL (ref 11.6–15.9)
Lymphocytes Relative: 31 %
Lymphs Abs: 1.6 10*3/uL (ref 0.9–3.3)
MCH: 25.8 pg (ref 25.1–34.0)
MCHC: 31.1 g/dL — ABNORMAL LOW (ref 31.5–36.0)
MCV: 82.9 fL (ref 79.5–101.0)
MONO ABS: 0.2 10*3/uL (ref 0.1–0.9)
Monocytes Relative: 5 %
Neutro Abs: 3.3 10*3/uL (ref 1.5–6.5)
Neutrophils Relative %: 62 %
Platelet Count: 273 10*3/uL (ref 145–400)
RBC: 4.85 MIL/uL (ref 3.70–5.45)
RDW: 13.2 % (ref 11.2–14.5)
WBC: 5.1 10*3/uL (ref 3.9–10.3)

## 2018-02-10 LAB — CMP (CANCER CENTER ONLY)
ALBUMIN: 3.6 g/dL (ref 3.5–5.0)
ALK PHOS: 81 U/L (ref 40–150)
ALT: 12 U/L (ref 0–55)
ANION GAP: 8 (ref 3–11)
AST: 10 U/L (ref 5–34)
BUN: 16 mg/dL (ref 7–26)
CALCIUM: 9.2 mg/dL (ref 8.4–10.4)
CO2: 24 mmol/L (ref 22–29)
Chloride: 107 mmol/L (ref 98–109)
Creatinine: 0.82 mg/dL (ref 0.60–1.10)
GFR, Est AFR Am: >60 mL/min (ref >60)
GLUCOSE: 94 mg/dL (ref 70–140)
POTASSIUM: 4.5 mmol/L (ref 3.5–5.1)
Sodium: 139 mmol/L (ref 136–145)
Total Bilirubin: 0.3 mg/dL (ref 0.2–1.2)
Total Protein: 7.2 g/dL (ref 6.4–8.3)

## 2018-02-10 LAB — LACTATE DEHYDROGENASE: LDH: 146 U/L (ref 125–245)

## 2018-02-10 NOTE — Progress Notes (Signed)
CLINIC:  Survivorship   REASON FOR VISIT:  Routine follow-up for history of lymphoma.   BRIEF ONCOLOGIC HISTORY:  Oncology History   Hodgkin's lymphoma, nodular sclerosis   Primary site: Lymphoid Neoplasms   Staging method: AJCC 6th Edition   Clinical: Stage III signed by Heath Lark, MD on 12/29/2013  8:11 PM   Pathologic: Stage III signed by Heath Lark, MD on 12/29/2013  8:11 PM   Summary: Stage III       History of Hodgkin's lymphoma   07/30/2013 Imaging    CT scans show disease on both sides of the diaphragm with splenic involvement.      08/09/2013 Surgery    Mediastinoscopy and biopsy confirmed classical Hodgkin lymphoma      08/14/2013 - 01/31/2014 Chemotherapy    Patient was started on treatment with ABVD, completed 6 cycles of treatment.      11/22/2013 Imaging    Repeat staging CT scans showed marked response to treatment.      03/01/2014 Imaging    PET CT scan show complete remission.      08/29/2014 Imaging    RepeaT PET CT scan showed regression in the size of LN but with mild increased hypermetabolic activity.      10/24/2014 Imaging    Repeat PET/CT scan show persistent hypermetabolic activity in the mediastinal lymph node.      12/13/2014 Surgery    She had excision of mediastinal mass with negative cancer recurrence      03/13/2015 Imaging    PET CT scan showed stable lymphadenopathy      03/17/2015 Procedure    Port is removed      09/11/2015 Imaging    PET CT scan showed stable lymphadenopathy      02/05/2016 Imaging    CT chest showed stable lymphadenopathy       INTERVAL HISTORY:  Ms. Folz presents to the [facility] today for routine follow-up for her history of lymphoma.  She is doing well today.  She notes a persistent reflux and indigestion for the past several months.  It progressively worsened to the point that she was having excruciating stomach pain with eating.  She was started on Omeprazole at the encouragement of her  mother, and her issues have improved with the indigestion.  She wants to know what to do moving forward, because she doesn't want to be on the Omeprazole long term.   She is doing well otherwise.  She denies any issues such as fatigue, night sweats, weight loss.       REVIEW OF SYSTEMS:  Review of Systems  Constitutional: Negative for appetite change, chills, fatigue, fever and unexpected weight change.  HENT:   Negative for hearing loss, lump/mass and trouble swallowing.   Eyes: Negative for eye problems and icterus.  Respiratory: Negative for chest tightness, cough and shortness of breath.   Cardiovascular: Negative for chest pain, leg swelling and palpitations.  Gastrointestinal: Negative for abdominal distention, abdominal pain, constipation, diarrhea, nausea and vomiting.  Endocrine: Negative for hot flashes.  Musculoskeletal: Negative for arthralgias.  Skin: Negative for itching and rash.  Neurological: Negative for dizziness, extremity weakness, headaches and numbness.  Hematological: Negative for adenopathy. Does not bruise/bleed easily.  Psychiatric/Behavioral: Negative for depression. The patient is not nervous/anxious.        PAST MEDICAL/SURGICAL HISTORY:  Past Medical History:  Diagnosis Date  . Anxiety   . Depression    as a 27 year old  . Fungal skin infection  09/01/2014  . hodgkins lymphoma 08/12/13   lymphoma recently dx  . Mass in neck    right side    Past Surgical History:  Procedure Laterality Date  . LYMPH NODE BIOPSY  08/09/13   medial neck area - lymphoma dx.  . MEDIASTINOSCOPY N/A 08/09/2013   Procedure: MEDIASTINOSCOPY;  Surgeon: Melrose Nakayama, MD;  Location: [facility] OR;  Service: Thoracic;  Laterality: N/A;  . TOOTH EXTRACTION       ALLERGIES:  Allergies  Allergen Reactions  . Adhesive [Tape] Rash     CURRENT MEDICATIONS:  Outpatient Encounter Medications as of 02/10/2018  Medication Sig Note  . ACZONE 5 % topical gel Apply 5 each  topically 2 (two) times daily. 10/25/2014: Received from: External Pharmacy Received Sig:   . adapalene (DIFFERIN) 0.1 % cream Apply 0.1 each topically at bedtime. To affected area 10/25/2014: Received from: External Pharmacy Received Sig:   . cholecalciferol (VITAMIN D) 1000 units tablet Take 2,000 Units by mouth daily.   Marland Kitchen desogestrel-ethinyl estradiol (RECLIPSEN) 0.15-30 MG-MCG tablet Take 1 tablet by mouth daily.   Marland Kitchen ibuprofen (ADVIL,MOTRIN) 200 MG tablet Take 400-600 mg by mouth every 6 (six) hours as needed for fever, headache, mild pain, moderate pain or cramping.    . loratadine (CLARITIN) 10 MG tablet Take 10 mg by mouth daily.    . vitamin B-12 (CYANOCOBALAMIN) 1000 MCG tablet Take 1,000 mcg by mouth daily.    No facility-administered encounter medications on file as of 02/10/2018.      ONCOLOGIC FAMILY HISTORY:  Family History  Problem Relation Age of Onset  . Alzheimer's disease Unknown   . Osteoarthritis Mother   . Heart disease Unknown   . Hearing loss Unknown   . Migraines Mother   . Parkinsonism Unknown   . Breast cancer Unknown   . Transient ischemic attack Unknown   . Autoimmune disease Unknown       SOCIAL HISTORY:  Social History   Socioeconomic History  . Marital status: Married    Spouse name: Not on file  . Number of children: Not on file  . Years of education: Not on file  . Highest education level: Not on file  Occupational History  . Not on file  Social Needs  . Financial resource strain: Not on file  . Food insecurity:    Worry: Not on file    Inability: Not on file  . Transportation needs:    Medical: Not on file    Non-medical: Not on file  Tobacco Use  . Smoking status: Never Smoker  . Smokeless tobacco: Never Used  Substance and Sexual Activity  . Alcohol use: No  . Drug use: No  . Sexual activity: Not on file  Lifestyle  . Physical activity:    Days per week: Not on file    Minutes per session: Not on file  . Stress: Not on file    Relationships  . Social connections:    Talks on phone: Not on file    Gets together: Not on file    Attends religious service: Not on file    Active member of club or organization: Not on file    Attends meetings of clubs or organizations: Not on file    Relationship status: Not on file  . Intimate partner violence:    Fear of current or ex partner: Not on file    Emotionally abused: Not on file    Physically abused: Not on file  Forced sexual activity: Not on file  Other Topics Concern  . Not on file  Social History Narrative  . Not on file      PHYSICAL EXAMINATION:  Vital Signs: Vitals:   02/10/18 1119  BP: 127/81  Pulse: 77  Resp: 18  Temp: 98.4 F (36.9 C)  SpO2: 100%   Filed Weights   02/10/18 1119  Weight: 269 lb (122 kg)   General: Well-nourished, well-appearing woman in no acute distress. Unaccompanied today. HEENT: Head is normocephalic.  Pupils equal and reactive to light. Conjunctivae clear without exudate.  Sclerae anicteric. Oral mucosa is pink, moist.  Oropharynx is pink without lesions or erythema.  Lymph: No cervical, supraclavicular, infraclavicular, or axillary lymphadenopathy noted on palpation.  Cardiovascular: Regular rate and rhythm.Marland Kitchen Respiratory: Clear to auscultation bilaterally. Chest expansion symmetric; breathing non-labored.  Breasts: bilateral breast without nodules, masses, skin or nipple changes GI: Abdomen soft and round; non-tender, non-distended. Bowel sounds normoactive. No hepatosplenomegaly.   GU: Deferred.  Neuro: No focal deficits. Steady gait.  Psych: Mood and affect normal and appropriate for situation.  Extremities: No edema. Skin: Warm and dry.   LABORATORY DATA:  Appointment on 02/10/2018  Component Date Value Ref Range Status  . LDH 02/10/2018 146  125 - 245 U/L Final   Performed at [facility], [place] [address] [place], [place] [address]  . Sodium 02/10/2018 139  136 - 145 mmol/L Final   . Potassium 02/10/2018 4.5  3.5 - 5.1 mmol/L Final  . Chloride 02/10/2018 107  98 - 109 mmol/L Final  . CO2 02/10/2018 24  22 - 29 mmol/L Final  . Glucose, Bld 02/10/2018 94  70 - 140 mg/dL Final  . BUN 02/10/2018 16  7 - 26 mg/dL Final  . Creatinine 02/10/2018 0.82  0.60 - 1.10 mg/dL Final  . Calcium 02/10/2018 9.2  8.4 - 10.4 mg/dL Final  . Total Protein 02/10/2018 7.2  6.4 - 8.3 g/dL Final  . Albumin 02/10/2018 3.6  3.5 - 5.0 g/dL Final  . AST 02/10/2018 10  5 - 34 U/L Final  . ALT 02/10/2018 12  0 - 55 U/L Final  . Alkaline Phosphatase 02/10/2018 81  40 - 150 U/L Final  . Total Bilirubin 02/10/2018 0.3  0.2 - 1.2 mg/dL Final  . GFR, Est Non Af Am 02/10/2018 >60  >60 mL/min Final  . GFR, Est AFR Am 02/10/2018 >60  >60 mL/min Final   Comment: (NOTE) The eGFR has been calculated using the CKD EPI equation. This calculation has not been validated in all clinical situations. eGFR's persistently <60 mL/min signify possible Chronic Kidney Disease.   Georgiann Hahn gap 02/10/2018 8  3 - 11 Final   Performed at [facility], [place] [address], [place], [place] [address]  . WBC Count 02/10/2018 5.1  3.9 - 10.3 K/uL Final  . RBC 02/10/2018 4.85  3.70 - 5.45 MIL/uL Final  . Hemoglobin 02/10/2018 12.5  11.6 - 15.9 g/dL Final  . HCT 02/10/2018 40.2  34.8 - 46.6 % Final  . MCV 02/10/2018 82.9  79.5 - 101.0 fL Final  . MCH 02/10/2018 25.8  25.1 - 34.0 pg Final  . MCHC 02/10/2018 31.1* 31.5 - 36.0 g/dL Final  . RDW 02/10/2018 13.2  11.2 - 14.5 % Final  . Platelet Count 02/10/2018 273  145 - 400 K/uL Final  . Neutrophils Relative % 02/10/2018 62  % Final  . Neutro Abs 02/10/2018 3.3  1.5 -  6.5 K/uL Final  . Lymphocytes Relative 02/10/2018 31  % Final  . Lymphs Abs 02/10/2018 1.6  0.9 - 3.3 K/uL Final  . Monocytes Relative 02/10/2018 5  % Final  . Monocytes Absolute 02/10/2018 0.2  0.1 - 0.9 K/uL Final  . Eosinophils Relative 02/10/2018 1  % Final  . Eosinophils Absolute  02/10/2018 0.0  0.0 - 0.5 K/uL Final  . Basophils Relative 02/10/2018 1  % Final  . Basophils Absolute 02/10/2018 0.0  0.0 - 0.1 K/uL Final   Performed at [facility], [place] [address] [place], [place] [address]    DIAGNOSTIC IMAGING:  None for this visit     ASSESSMENT AND PLAN:  Ms.. Digman is a pleasant 27 y.o. woman with history of lymphoma, diagnosed in 07/2013; treated with 6 cycles of ABVD and surgery. Ms.Brunson presents to the Survivorship Clinic for surveillance and routine follow-up.   1. History of Stage III Hodgkin Lymphoma:  Ms. Millirons is currently clinically and radiographically without evidence of disease or recurrence of lymphoma. She will follow-up in the Survivorship Clinic in 1 year with labs, history, and physical exam per surveillance protocol.  I recommended that she get a PCP to coordinate her care.  I encouraged her to call me with any questions or concerns before her next visit at the cancer center, and I would be happy to see the patient sooner, if needed.    2. Cancer screening:  Due to Ms. Date's history and age, she should receive screening for skin cancers, gynecologic cancers. She will not need early mammography as radiation was not part of her treatment.  She can start mammography and colon cancer treatment at the recommended start age when she is in her forties.  The patient was encouraged to follow-up with her PCP for appropriate cancer screenings.   3. Health maintenance and wellness promotion: Ms. Pollitt was encouraged to consume 5-7 servings of fruits and vegetables per day. The patient was also encouraged to engage in moderate to vigorous exercise for 30 minutes per day most days of the week. Ms. Wiginton was instructed to limit her alcohol consumption and continue to abstain from tobacco use.   4. GERD: I referred her to GI for further evaluation with labs, ? h pylori testing, etc.  I reviewed basic GERD recommendations, such as dietary  modification, weight loss, and sitting upright for at least 3 hours after eating.     Dispo:  -Return to cancer center to see Survivorship NP in in one year with labs prior   A total of (20) minutes of face-to-face time was spent with this patient with greater than 50% of that time in counseling and care-coordination.   Mike Craze, NP Survivorship Program [place] [phone]   Note: PRIMARY CARE PROVIDER Berkley Harvey, [place] [phone]

## 2018-02-10 NOTE — Telephone Encounter (Signed)
Gave patient AVs and calendar of upcoming June 2020 appointments

## 2018-03-02 ENCOUNTER — Ambulatory Visit: Payer: BLUE CROSS/BLUE SHIELD | Admitting: Gastroenterology

## 2018-03-04 ENCOUNTER — Other Ambulatory Visit (INDEPENDENT_AMBULATORY_CARE_PROVIDER_SITE_OTHER): Payer: BLUE CROSS/BLUE SHIELD

## 2018-03-04 ENCOUNTER — Ambulatory Visit: Payer: BLUE CROSS/BLUE SHIELD | Admitting: Gastroenterology

## 2018-03-04 ENCOUNTER — Encounter: Payer: Self-pay | Admitting: Gastroenterology

## 2018-03-04 VITALS — BP 122/80 | HR 102 | Ht 70.0 in | Wt 264.0 lb

## 2018-03-04 DIAGNOSIS — R1013 Epigastric pain: Secondary | ICD-10-CM

## 2018-03-04 DIAGNOSIS — K219 Gastro-esophageal reflux disease without esophagitis: Secondary | ICD-10-CM

## 2018-03-04 LAB — H. PYLORI ANTIBODY, IGG: H PYLORI IGG: NEGATIVE

## 2018-03-04 NOTE — Patient Instructions (Addendum)
If you are age 25 or older, your body mass index should be between 23-30. Your Body mass index is 37.88 kg/m. If this is out of the aforementioned range listed, please consider follow up with your Primary Care Provider.  If you are age 32 or younger, your body mass index should be between 19-25. Your Body mass index is 37.88 kg/m. If this is out of the aformentioned range listed, please consider follow up with your Primary Care Provider.   Please go to the lab in the basement of our building to have lab work done as you leave today.  Decrease your Prilosec to every other day for a few weeks and then discontinue taking.  Please purchase the following medications over the counter and take as directed: Zantac 150 mg: Take daily for recurrent symptoms  Avoid all NSAIDs such as Ibuprofen.  Thank you.

## 2018-03-04 NOTE — Progress Notes (Signed)
03/04/2018 Miranda Villegas [id number] 28-Jun-1991   HISTORY OF PRESENT ILLNESS:  This is a pleasant 27 year old female who is new to our office, referred here by Deanne Coffer, NP.  She has a history of Hodgkins lymphoma.  She says that about 4 years ago when she was receiving chemo she was having a lot of reflux and was placed on omeprazole at that time.  Once finished with her chemo she discontinued the omeprazole as well.  Did not have too many problems with reflux after that.  However, in January of this year she was taking NSAID's for week or two and started having a lot of acid reflux again and then one night she developed severe epigastric pain.  She says that the pain lasted for a few days before her mother recommended that she restart the omeprazole.  She started taking the medication again and after just a couple of days her symptoms completely resolved.  She has just continued taking the omeprazole since that time.  Has felt great without any symptoms since that time.  She comes in today to discuss her issues and for recommendations regarding treatment long-term.  She has not taken any NSAID's since that time.   Past Medical History:  Diagnosis Date  . Anxiety   . Depression    as a 27 year old  . Fungal skin infection 09/01/2014  . hodgkins lymphoma 08/12/13   lymphoma recently dx  . Mass in neck    right side    Past Surgical History:  Procedure Laterality Date  . LYMPH NODE BIOPSY  08/09/13   medial neck area - lymphoma dx.  . MEDIASTINOSCOPY N/A 08/09/2013   Procedure: MEDIASTINOSCOPY;  Surgeon: Melrose Nakayama, MD;  Location: [facility];  Service: Thoracic;  Laterality: N/A;  . TOOTH EXTRACTION      reports that she has never smoked. She has never used smokeless tobacco. She reports that she does not drink alcohol or use drugs. family history includes Alzheimer's disease in her unknown relative; Autoimmune disease in her unknown relative; Breast cancer in her  unknown relative; Hearing loss in her unknown relative; Heart disease in her unknown relative; Migraines in her mother; Osteoarthritis in her mother; Parkinsonism in her unknown relative; Transient ischemic attack in her unknown relative. Allergies  Allergen Reactions  . Adhesive [Tape] Rash      Outpatient Encounter Medications as of 03/04/2018  Medication Sig  . cholecalciferol (VITAMIN D) 1000 units tablet Take 2,000 Units by mouth daily.  Marland Kitchen desogestrel-ethinyl estradiol (RECLIPSEN) 0.15-30 MG-MCG tablet Take 1 tablet by mouth daily.  Marland Kitchen ibuprofen (ADVIL,MOTRIN) 200 MG tablet Take 400-600 mg by mouth every 6 (six) hours as needed for fever, headache, mild pain, moderate pain or cramping.   . loratadine (CLARITIN) 10 MG tablet Take 10 mg by mouth daily.   Marland Kitchen omeprazole (PRILOSEC) 20 MG capsule Take 20 mg by mouth daily.  . vitamin B-12 (CYANOCOBALAMIN) 1000 MCG tablet Take 1,000 mcg by mouth daily.   No facility-administered encounter medications on file as of 03/04/2018.      REVIEW OF SYSTEMS  : All other systems reviewed and negative except where noted in the History of Present Illness.   PHYSICAL EXAM: BP 122/80   Pulse (!) 102   Ht 5\' 10"  (1.778 m)   Wt 264 lb (119.7 kg)   BMI 37.88 kg/m  General: Well developed white female in no acute distress Head: Normocephalic and atraumatic Eyes:  Sclerae anicteric, conjunctiva pink. Ears: Normal auditory acuity Lungs: Clear throughout to auscultation; no increased WOB. Heart: Regular rate and rhythm; no M/R/G. Abdomen: Soft, non-distended.  BS present.  Non-tender. Musculoskeletal: Symmetrical with no gross deformities  Skin: No lesions on visible extremities Extremities: No edema  Neurological: Alert oriented x 4, grossly non-focal Psychological:  Alert and cooperative. Normal mood and affect  ASSESSMENT AND PLAN: *27 year old female with history of Hodgkins lymphoma who was taking NSAID's and having acid reflux when she  developed severe epigastric pain 6 months ago.  After taking PPI for a few days symptoms completely resolved and has not even had recurrent reflux since then.  Possibly had an NSAID induced ulcer.  Will continue to avoid NSAID's.  Will check for Hpylori.  She is concerned about PPI long-term at her age.  I agree.  She will decrease her omeprazole to every other day for a few weeks then discontinue.  Will try zantac 150 mg daily for recurrent symptoms and will certainly call our office back as well.  I do not think that there is any need for EGD at this time.   CC:  Wilber Bihari Cornett*

## 2018-03-10 NOTE — Progress Notes (Signed)
Reviewed and agree with documentation and assessment and plan. K. Veena Angelos Wasco , MD   

## 2019-02-11 ENCOUNTER — Encounter: Payer: Self-pay | Admitting: Adult Health

## 2019-02-11 ENCOUNTER — Telehealth: Payer: Self-pay | Admitting: Adult Health

## 2019-02-11 NOTE — Telephone Encounter (Signed)
Per 5/21 schedule message moved 6/2 visit to 6/3 as webex. Lab cancelled per provider. Confirmed with patient.

## 2019-02-18 ENCOUNTER — Telehealth: Payer: Self-pay | Admitting: Adult Health

## 2019-02-18 NOTE — Telephone Encounter (Signed)
Called patient regarding upcoming Webex appointment, patient is notified and e-mail has been sent. °

## 2019-02-23 ENCOUNTER — Other Ambulatory Visit: Payer: BLUE CROSS/BLUE SHIELD

## 2019-02-23 ENCOUNTER — Encounter: Payer: BLUE CROSS/BLUE SHIELD | Admitting: Adult Health

## 2019-02-24 ENCOUNTER — Encounter: Payer: BLUE CROSS/BLUE SHIELD | Admitting: Adult Health

## 2019-06-01 ENCOUNTER — Telehealth: Payer: Self-pay | Admitting: Hematology and Oncology

## 2019-06-01 ENCOUNTER — Telehealth: Payer: Self-pay

## 2019-06-01 NOTE — Telephone Encounter (Signed)
Scheduled appt per 9/8 sch message - pt is aware of appt date and time

## 2019-06-01 NOTE — Telephone Encounter (Signed)
I can see her today at 1130 or tomorrow at 930 am, 30 mins

## 2019-06-01 NOTE — Telephone Encounter (Signed)
Called and given below message. She verbalized understanding. She is working today and tomorrow and declined both appts. Scheduling message sent for appt this Thursday or Friday when she is off work.

## 2019-06-01 NOTE — Telephone Encounter (Signed)
She called and left a message. She has tried to make appt with Wilber Bihari, NP and has not heard back. She has a lump pop up under her arm pit a few weeks ago. She is requesting appt with labs.

## 2019-06-02 ENCOUNTER — Telehealth: Payer: Self-pay

## 2019-06-02 NOTE — Telephone Encounter (Signed)
Called and left a message asking her to call the office. 

## 2019-06-02 NOTE — Telephone Encounter (Signed)
Called her back to clarify screening questions from earlier call. She has not been around anyone who is positve for COVID-19.

## 2019-06-03 ENCOUNTER — Encounter: Payer: Self-pay | Admitting: Hematology and Oncology

## 2019-06-03 ENCOUNTER — Inpatient Hospital Stay: Payer: Self-pay

## 2019-06-03 ENCOUNTER — Inpatient Hospital Stay: Payer: Self-pay | Attending: Hematology and Oncology | Admitting: Hematology and Oncology

## 2019-06-03 ENCOUNTER — Other Ambulatory Visit: Payer: Self-pay

## 2019-06-03 VITALS — BP 127/66 | HR 71 | Temp 98.5°F | Resp 18 | Ht 70.0 in | Wt 293.2 lb

## 2019-06-03 DIAGNOSIS — Z8571 Personal history of Hodgkin lymphoma: Secondary | ICD-10-CM

## 2019-06-03 DIAGNOSIS — Z8572 Personal history of non-Hodgkin lymphomas: Secondary | ICD-10-CM | POA: Insufficient documentation

## 2019-06-03 LAB — COMPREHENSIVE METABOLIC PANEL
ALT: 12 U/L (ref 0–44)
AST: 10 U/L — ABNORMAL LOW (ref 15–41)
Albumin: 3.7 g/dL (ref 3.5–5.0)
Alkaline Phosphatase: 70 U/L (ref 38–126)
Anion gap: 8 (ref 5–15)
BUN: 12 mg/dL (ref 6–20)
CO2: 25 mmol/L (ref 22–32)
Calcium: 9 mg/dL (ref 8.9–10.3)
Chloride: 107 mmol/L (ref 98–111)
Creatinine, Ser: 0.8 mg/dL (ref 0.44–1.00)
GFR calc Af Amer: >60 mL/min (ref >60)
GFR calc non Af Amer: >60 mL/min (ref >60)
Glucose, Bld: 99 mg/dL (ref 70–99)
Potassium: 4.1 mmol/L (ref 3.5–5.1)
Sodium: 140 mmol/L (ref 135–145)
Total Bilirubin: <0.2 mg/dL — ABNORMAL LOW (ref 0.3–1.2)
Total Protein: 7 g/dL (ref 6.5–8.1)

## 2019-06-03 LAB — LACTATE DEHYDROGENASE: LDH: 131 U/L (ref 98–192)

## 2019-06-03 LAB — CBC WITH DIFFERENTIAL/PLATELET
Abs Immature Granulocytes: 0.02 10*3/uL (ref 0.00–0.07)
Basophils Absolute: 0 10*3/uL (ref 0.0–0.1)
Basophils Relative: 0 %
Eosinophils Absolute: 0 10*3/uL (ref 0.0–0.5)
Eosinophils Relative: 1 %
HCT: 38.6 % (ref 36.0–46.0)
Hemoglobin: 11.9 g/dL — ABNORMAL LOW (ref 12.0–15.0)
Immature Granulocytes: 0 %
Lymphocytes Relative: 33 %
Lymphs Abs: 2.3 10*3/uL (ref 0.7–4.0)
MCH: 25.9 pg — ABNORMAL LOW (ref 26.0–34.0)
MCHC: 30.8 g/dL (ref 30.0–36.0)
MCV: 83.9 fL (ref 80.0–100.0)
Monocytes Absolute: 0.4 10*3/uL (ref 0.1–1.0)
Monocytes Relative: 6 %
Neutro Abs: 4.2 10*3/uL (ref 1.7–7.7)
Neutrophils Relative %: 60 %
Platelets: 319 10*3/uL (ref 150–400)
RBC: 4.6 MIL/uL (ref 3.87–5.11)
RDW: 13.9 % (ref 11.5–15.5)
WBC: 7 10*3/uL (ref 4.0–10.5)
nRBC: 0 % (ref 0.0–0.2)

## 2019-06-03 LAB — SEDIMENTATION RATE: Sed Rate: 27 mm/hr — ABNORMAL HIGH (ref 0–22)

## 2019-06-03 NOTE — Progress Notes (Signed)
[facility] OFFICE PROGRESS NOTE  Patient Care Team: Berkley Harvey, NP as PCP - General (Nurse Practitioner) Izora Gala, MD as Consulting Physician (Otolaryngology) Melrose Nakayama, MD as Consulting Physician (Cardiothoracic Surgery) Clydene Fake, MD as Consulting Physician (Hematology and Oncology) Marlou Porch., MD as Consulting Physician (Cardiothoracic Surgery)  ASSESSMENT & PLAN:  History of hodgkin's lymphoma I am not able to appreciate any lymphadenopathy However, due to risk of cancer recurrence, I recommend ultrasound of her left axilla for further evaluation She agreed with the plan of care I will call her with test results For today, I will order some basic blood work to rule out other abnormalities   Orders Placed This Encounter  Procedures  . [place] AXILLA LEFT    Standing Status:   Future    Standing Expiration Date:   08/02/2020    Order Specific Question:   Reason for Exam (SYMPTOM  OR DIAGNOSIS REQUIRED)    Answer:   Hx lymphoma, recently self palpable lymphadenopathy, exclude recurrence    Order Specific Question:   Preferred imaging location?    Answer:   [facility]  . Comprehensive metabolic panel    Standing Status:   Future    Number of Occurrences:   1    Standing Expiration Date:   07/07/2020  . CBC with Differential/Platelet    Standing Status:   Future    Number of Occurrences:   1    Standing Expiration Date:   07/07/2020  . Lactate dehydrogenase    Standing Status:   Future    Number of Occurrences:   1    Standing Expiration Date:   06/02/2020  . Sedimentation rate    Standing Status:   Future    Number of Occurrences:   1    Standing Expiration Date:   07/07/2020    INTERVAL HISTORY: Please see below for problem oriented charting. The patient is seen urgently due to concern for recent palpable lymphadenopathy She has background history of Hodgkin lymphoma, successfully treated Recently, she  changed some deodorant products and developed recurrent lymphadenopathy on the left axilla that spontaneously regressed She denies other new lymphadenopathy Denies abnormal weight loss or night sweats  SUMMARY OF ONCOLOGIC HISTORY: Oncology History Overview Note  Hodgkin's lymphoma, nodular sclerosis   Primary site: Lymphoid Neoplasms   Staging method: AJCC 6th Edition   Clinical: Stage III signed by Heath Lark, MD on 12/29/2013  8:11 PM   Pathologic: Stage III signed by Heath Lark, MD on 12/29/2013  8:11 PM   Summary: Stage III     History of Hodgkin's lymphoma  07/30/2013 Imaging   CT scans show disease on both sides of the diaphragm with splenic involvement.   08/09/2013 Surgery   Mediastinoscopy and biopsy confirmed classical Hodgkin lymphoma   08/14/2013 - 01/31/2014 Chemotherapy   Patient was started on treatment with ABVD, completed 6 cycles of treatment.   11/22/2013 Imaging   Repeat staging CT scans showed marked response to treatment.   03/01/2014 Imaging   PET CT scan show complete remission.   08/29/2014 Imaging   RepeaT PET CT scan showed regression in the size of LN but with mild increased hypermetabolic activity.   10/24/2014 Imaging   Repeat PET/CT scan show persistent hypermetabolic activity in the mediastinal lymph node.   12/13/2014 Surgery   She had excision of mediastinal mass with negative cancer recurrence   03/13/2015 Imaging   PET CT scan showed stable  lymphadenopathy   03/17/2015 Procedure   Port is removed   09/11/2015 Imaging   PET CT scan showed stable lymphadenopathy   02/05/2016 Imaging   CT chest showed stable lymphadenopathy     REVIEW OF SYSTEMS:   Constitutional: Denies fevers, chills or abnormal weight loss Eyes: Denies blurriness of vision Ears, nose, mouth, throat, and face: Denies mucositis or sore throat Respiratory: Denies cough, dyspnea or wheezes Cardiovascular: Denies palpitation, chest discomfort or lower extremity  swelling Gastrointestinal:  Denies nausea, heartburn or change in bowel habits Skin: Denies abnormal skin rashes Lymphatics: Denies new lymphadenopathy or easy bruising Neurological:Denies numbness, tingling or new weaknesses Behavioral/Psych: Mood is stable, no new changes  All other systems were reviewed with the patient and are negative.  I have reviewed the past medical history, past surgical history, social history and family history with the patient and they are unchanged from previous note.  ALLERGIES:  is allergic to adhesive [tape].  MEDICATIONS:  Current Outpatient Medications  Medication Sig Dispense Refill  . cholecalciferol (VITAMIN D) 1000 units tablet Take 2,000 Units by mouth daily.    Marland Kitchen desogestrel-ethinyl estradiol (RECLIPSEN) 0.15-30 MG-MCG tablet Take 1 tablet by mouth daily.    Marland Kitchen ibuprofen (ADVIL,MOTRIN) 200 MG tablet Take 400-600 mg by mouth every 6 (six) hours as needed for fever, headache, mild pain, moderate pain or cramping.     . loratadine (CLARITIN) 10 MG tablet Take 10 mg by mouth daily.     . vitamin B-12 (CYANOCOBALAMIN) 1000 MCG tablet Take 1,000 mcg by mouth daily.     No current facility-administered medications for this visit.     PHYSICAL EXAMINATION: ECOG PERFORMANCE STATUS: 0 - Asymptomatic  Vitals:   06/03/19 1135  BP: 127/66  Pulse: 71  Resp: 18  Temp: 98.5 F (36.9 C)  SpO2: 100%   Filed Weights   06/03/19 1135  Weight: 293 lb 3.2 oz (133 kg)    GENERAL:alert, no distress and comfortable SKIN: skin color, texture, turgor are normal, no rashes or significant lesions EYES: normal, Conjunctiva are pink and non-injected, sclera clear OROPHARYNX:no exudate, no erythema and lips, buccal mucosa, and tongue normal  NECK: supple, thyroid normal size, non-tender, without nodularity LYMPH:  no palpable lymphadenopathy in the cervical, axillary or inguinal.  Due to her obesity, the accuracy of examination could be limited. LUNGS: clear to  auscultation and percussion with normal breathing effort HEART: regular rate & rhythm and no murmurs and no lower extremity edema ABDOMEN:abdomen soft, non-tender and normal bowel sounds Musculoskeletal:no cyanosis of digits and no clubbing  NEURO: alert & oriented x 3 with fluent speech, no focal motor/sensory deficits  LABORATORY DATA:  I have reviewed the data as listed    Component Value Date/Time   NA 139 02/10/2018 1040   NA 141 02/05/2017 0913   K 4.5 02/10/2018 1040   K 4.3 02/05/2017 0913   CL 107 02/10/2018 1040   CO2 24 02/10/2018 1040   CO2 25 02/05/2017 0913   GLUCOSE 94 02/10/2018 1040   GLUCOSE 93 02/05/2017 0913   BUN 16 02/10/2018 1040   BUN 12.4 02/05/2017 0913   CREATININE 0.82 02/10/2018 1040   CREATININE 0.8 02/05/2017 0913   CALCIUM 9.2 02/10/2018 1040   CALCIUM 9.7 02/05/2017 0913   PROT 7.2 02/10/2018 1040   PROT 7.3 02/05/2017 0913   ALBUMIN 3.6 02/10/2018 1040   ALBUMIN 3.8 02/05/2017 0913   AST 10 02/10/2018 1040   AST 13 02/05/2017 0913   ALT 12  02/10/2018 1040   ALT 17 02/05/2017 0913   ALKPHOS 81 02/10/2018 1040   ALKPHOS 66 02/05/2017 0913   BILITOT 0.3 02/10/2018 1040   BILITOT 0.35 02/05/2017 0913   GFRNONAA >60 02/10/2018 1040   GFRAA >60 02/10/2018 1040    No results found for: SPEP, UPEP  Lab Results  Component Value Date   WBC 7.0 06/03/2019   NEUTROABS 4.2 06/03/2019   HGB 11.9 (L) 06/03/2019   HCT 38.6 06/03/2019   MCV 83.9 06/03/2019   PLT 319 06/03/2019      Chemistry      Component Value Date/Time   NA 139 02/10/2018 1040   NA 141 02/05/2017 0913   K 4.5 02/10/2018 1040   K 4.3 02/05/2017 0913   CL 107 02/10/2018 1040   CO2 24 02/10/2018 1040   CO2 25 02/05/2017 0913   BUN 16 02/10/2018 1040   BUN 12.4 02/05/2017 0913   CREATININE 0.82 02/10/2018 1040   CREATININE 0.8 02/05/2017 0913      Component Value Date/Time   CALCIUM 9.2 02/10/2018 1040   CALCIUM 9.7 02/05/2017 0913   ALKPHOS 81 02/10/2018 1040    ALKPHOS 66 02/05/2017 0913   AST 10 02/10/2018 1040   AST 13 02/05/2017 0913   ALT 12 02/10/2018 1040   ALT 17 02/05/2017 0913   BILITOT 0.3 02/10/2018 1040   BILITOT 0.35 02/05/2017 0913       All questions were answered. The patient knows to call the clinic with any problems, questions or concerns. No barriers to learning was detected.  I spent 15 minutes counseling the patient face to face. The total time spent in the appointment was 20 minutes and more than 50% was on counseling and review of test results  Heath Lark, MD 06/03/2019 11:57 AM

## 2019-06-03 NOTE — Assessment & Plan Note (Signed)
I am not able to appreciate any lymphadenopathy However, due to risk of cancer recurrence, I recommend ultrasound of her left axilla for further evaluation She agreed with the plan of care I will call her with test results For today, I will order some basic blood work to rule out other abnormalities

## 2019-06-17 ENCOUNTER — Telehealth: Payer: Self-pay | Admitting: *Deleted

## 2019-06-17 ENCOUNTER — Ambulatory Visit
Admission: RE | Admit: 2019-06-17 | Discharge: 2019-06-17 | Disposition: A | Discharge status: Home / Self Care | Payer: No Typology Code available for payment source | Source: Ambulatory Visit | Attending: Hematology and Oncology | Admitting: Hematology and Oncology

## 2019-06-17 ENCOUNTER — Other Ambulatory Visit: Payer: Self-pay

## 2019-06-17 DIAGNOSIS — Z8571 Personal history of Hodgkin lymphoma: Secondary | ICD-10-CM

## 2019-06-17 NOTE — Telephone Encounter (Signed)
-----   Message from Heath Lark, MD sent at 06/17/2019  1:05 PM EDT ----- Regarding: [place] is clear. PLs let her know No follow-up appointment is needed

## 2019-06-17 NOTE — Telephone Encounter (Signed)
Telephone call to patient and advised scan results. Patient verbalized an understanding.

## 2019-11-15 ENCOUNTER — Encounter: Payer: Self-pay | Admitting: Hematology and Oncology

## 2020-05-18 ENCOUNTER — Other Ambulatory Visit: Payer: Self-pay

## 2020-05-18 ENCOUNTER — Ambulatory Visit: Payer: Self-pay | Admitting: Obstetrics and Gynecology

## 2020-05-18 ENCOUNTER — Encounter: Payer: Self-pay | Admitting: Obstetrics and Gynecology

## 2020-05-18 VITALS — BP 133/84 | HR 89 | Wt 287.8 lb

## 2020-05-18 DIAGNOSIS — Z3492 Encounter for supervision of normal pregnancy, unspecified, second trimester: Secondary | ICD-10-CM

## 2020-05-18 DIAGNOSIS — Z3A14 14 weeks gestation of pregnancy: Secondary | ICD-10-CM

## 2020-05-18 NOTE — Progress Notes (Signed)
NOB: Patient presents today as a transfer from a different OB practice after having a pregnancy confirmation and ultrasound.  She reports she is doing well.  Her nausea and vomiting have stopped.  She is taking prenatal vitamins.  She does complain of a rash where her labia and thighs meet.  Physical examination General NAD, Conversant  HEENT Atraumatic; Op clear with mmm.  Normo-cephalic. Pupils reactive. Anicteric sclerae  Thyroid/Neck Smooth without nodularity or enlargement. Normal ROM.  Neck Supple.  Skin No rashes, lesions or ulceration. Normal palpated skin turgor. No nodularity.  Breasts: No masses or discharge.  Symmetric.  No axillary adenopathy.  Lungs: Clear to auscultation.No rales or wheezes. Normal Respiratory effort, no retractions.  Heart: NSR.  No murmurs or rubs appreciated. No periferal edema  Abdomen: Soft.  Non-tender.  No masses.  No HSM. No hernia  Extremities: Moves all appropriately.  Normal ROM for age. No lymphadenopathy.  Neuro: Oriented to PPT.  Normal mood. Normal affect.     Pelvic:   Vulva: Normal appearance.  Erythematous type rash noted somewhat excoriated.  Vagina: No lesions or abnormalities noted.  Support: Normal pelvic support.  Urethra No masses tenderness or scarring.  Meatus Normal size without lesions or prolapse.  Cervix: Normal appearance.  No lesions.  Anus: Normal exam.  No lesions.  Perineum: Normal exam.  No lesions.        Bimanual   Adnexae: No masses.  Non-tender to palpation.  Uterus: Enlarged. FHT 154  Non-tender.  Mobile.  AV.  Adnexae: No masses.  Non-tender to palpation.  Cul-de-sac: Negative for abnormality.  Adnexae: No masses.  Non-tender to palpation.         Pelvimetry   Diagonal: Reached.  Spines: Average.  Sacrum: Concave.  Pubic Arch: Normal.    Rash seems consistent with monilia.  Advised use of Monistat.  As well as intravaginal Monistat. GCT next visit Anes consult for BMI  I spent 22 minutes involved in the  care of this patient preparing to see the patient by obtaining and reviewing her medical history (including labs, imaging tests and prior procedures), documenting clinical information in the electronic health record (EHR), counseling and coordinating care plans, writing and sending prescriptions, ordering tests or procedures and directly communicating with the patient by discussing pertinent items from her history and physical exam as well as detailing my assessment and plan as noted above so that she has an informed understanding.  All of her questions were answered.

## 2020-05-18 NOTE — Addendum Note (Signed)
Addended by: Durwin Glaze on: 05/18/2020 03:23 PM   Modules accepted: Orders

## 2020-05-19 LAB — MONITOR DRUG PROFILE 14(MW)
Amphetamine Scrn, Ur: NEGATIVE ng/mL
BARBITURATE SCREEN URINE: NEGATIVE ng/mL
BENZODIAZEPINE SCREEN, URINE: NEGATIVE ng/mL
Buprenorphine, Urine: NEGATIVE ng/mL
CANNABINOIDS UR QL SCN: NEGATIVE ng/mL
Cocaine (Metab) Scrn, Ur: NEGATIVE ng/mL
Creatinine(Crt), U: 171.1 mg/dL (ref 20.0–300.0)
Fentanyl, Urine: NEGATIVE pg/mL
Meperidine Screen, Urine: NEGATIVE ng/mL
Methadone Screen, Urine: NEGATIVE ng/mL
OXYCODONE+OXYMORPHONE UR QL SCN: NEGATIVE ng/mL
Opiate Scrn, Ur: NEGATIVE ng/mL
Ph of Urine: 6.4 (ref 4.5–8.9)
Phencyclidine Qn, Ur: NEGATIVE ng/mL
Propoxyphene Scrn, Ur: NEGATIVE ng/mL
SPECIFIC GRAVITY: 1.027
Tramadol Screen, Urine: NEGATIVE ng/mL

## 2020-05-19 LAB — URINALYSIS, ROUTINE W REFLEX MICROSCOPIC
Bilirubin, UA: NEGATIVE
Glucose, UA: NEGATIVE
Leukocytes,UA: NEGATIVE
Nitrite, UA: NEGATIVE
Protein,UA: NEGATIVE
RBC, UA: NEGATIVE
Specific Gravity, UA: 1.024 (ref 1.005–1.030)
Urobilinogen, Ur: 0.2 mg/dL (ref 0.2–1.0)
pH, UA: 6.5 (ref 5.0–7.5)

## 2020-05-19 LAB — HEPATITIS B SURFACE ANTIGEN: Hepatitis B Surface Ag: NEGATIVE

## 2020-05-19 LAB — ANTIBODY SCREEN: Antibody Screen: NEGATIVE

## 2020-05-19 LAB — VARICELLA ZOSTER ANTIBODY, IGG: Varicella zoster IgG: 346 index (ref >165)

## 2020-05-19 LAB — CBC WITH DIFFERENTIAL/PLATELET
Basophils Absolute: 0 10*3/uL (ref 0.0–0.2)
Basos: 0 %
EOS (ABSOLUTE): 0 10*3/uL (ref 0.0–0.4)
Eos: 0 %
Hematocrit: 34.3 % (ref 34.0–46.6)
Hemoglobin: 11.5 g/dL (ref 11.1–15.9)
Immature Grans (Abs): 0.1 10*3/uL (ref 0.0–0.1)
Immature Granulocytes: 1 %
Lymphocytes Absolute: 1.6 10*3/uL (ref 0.7–3.1)
Lymphs: 22 %
MCH: 26.9 pg (ref 26.6–33.0)
MCHC: 33.5 g/dL (ref 31.5–35.7)
MCV: 80 fL (ref 79–97)
Monocytes Absolute: 0.3 10*3/uL (ref 0.1–0.9)
Monocytes: 4 %
Neutrophils Absolute: 5.4 10*3/uL (ref 1.4–7.0)
Neutrophils: 73 %
Platelets: 262 10*3/uL (ref 150–450)
RBC: 4.27 x10E6/uL (ref 3.77–5.28)
RDW: 14.4 % (ref 11.7–15.4)
WBC: 7.5 10*3/uL (ref 3.4–10.8)

## 2020-05-19 LAB — ABO AND RH: Rh Factor: POSITIVE

## 2020-05-19 LAB — RUBELLA SCREEN: Rubella Antibodies, IGG: <0.9 index — ABNORMAL LOW (ref >0.99)

## 2020-05-19 LAB — HIV ANTIBODY (ROUTINE TESTING W REFLEX): HIV Screen 4th Generation wRfx: NONREACTIVE

## 2020-05-19 LAB — RPR: RPR Ser Ql: NONREACTIVE

## 2020-05-20 LAB — GC/CHLAMYDIA PROBE AMP
Chlamydia trachomatis, NAA: NEGATIVE
Neisseria Gonorrhoeae by PCR: NEGATIVE

## 2020-05-21 LAB — CULTURE, OB URINE

## 2020-05-21 LAB — URINE CULTURE, OB REFLEX

## 2020-05-23 ENCOUNTER — Other Ambulatory Visit: Payer: Self-pay | Admitting: Surgical

## 2020-05-23 MED ORDER — NITROFURANTOIN MONOHYD MACRO 100 MG PO CAPS: 100.0000 mg | ORAL_CAPSULE | Freq: Two times a day (BID) | ORAL | 0 refills | Status: DC

## 2020-05-26 LAB — MATERNIT21  PLUS CORE+ESS+SCA, BLOOD

## 2020-06-14 NOTE — Patient Instructions (Signed)
WHAT OB PATIENTS CAN EXPECT   Confirmation of pregnancy and ultrasound ordered if medically indicated-[redacted] weeks gestation  New OB (NOB) intake with nurse and New OB (NOB) labs- [redacted] weeks gestation  New OB (NOB) physical examination with provider- 11/[redacted] weeks gestation  Flu vaccine-[redacted] weeks gestation  Anatomy scan-[redacted] weeks gestation  Glucose tolerance test, blood work to test for anemia, T-dap vaccine-[redacted] weeks gestation  Vaginal swabs/cultures-STD/Group B strep-[redacted] weeks gestation  Appointments every 4 weeks until 28 weeks  Every 2 weeks from 28 weeks until 36 weeks  Weekly visits from 36 weeks until delivery  Second Trimester of Pregnancy  The second trimester is from week 14 through week 27 (month 4 through 6). This is often the time in pregnancy that you feel your best. Often times, morning sickness has lessened or quit. You may have more energy, and you may get hungry more often. Your unborn baby is growing rapidly. At the end of the sixth month, he or she is about 9 inches long and weighs about 1 pounds. You will likely feel the baby move between 18 and 20 weeks of pregnancy. Follow these instructions at home: Medicines  Take over-the-counter and prescription medicines only as told by your doctor. Some medicines are safe and some medicines are not safe during pregnancy.  Take a prenatal vitamin that contains at least 600 micrograms (mcg) of folic acid.  If you have trouble pooping (constipation), take medicine that will make your stool soft (stool softener) if your doctor approves. Eating and drinking   Eat regular, healthy meals.  Avoid raw meat and uncooked cheese.  If you get low calcium from the food you eat, talk to your doctor about taking a daily calcium supplement.  Avoid foods that are high in fat and sugars, such as fried and sweet foods.  If you feel sick to your stomach (nauseous) or throw up (vomit): ? Eat 4 or 5 small meals a day instead of 3 large  meals. ? Try eating a few soda crackers. ? Drink liquids between meals instead of during meals.  To prevent constipation: ? Eat foods that are high in fiber, like fresh fruits and vegetables, whole grains, and beans. ? Drink enough fluids to keep your pee (urine) clear or pale yellow. Activity  Exercise only as told by your doctor. Stop exercising if you start to have cramps.  Do not exercise if it is too hot, too humid, or if you are in a place of great height (high altitude).  Avoid heavy lifting.  Wear low-heeled shoes. Sit and stand up straight.  You can continue to have sex unless your doctor tells you not to. Relieving pain and discomfort  Wear a good support bra if your breasts are tender.  Take warm water baths (sitz baths) to soothe pain or discomfort caused by hemorrhoids. Use hemorrhoid cream if your doctor approves.  Rest with your legs raised if you have leg cramps or low back pain.  If you develop puffy, bulging veins (varicose veins) in your legs: ? Wear support hose or compression stockings as told by your doctor. ? Raise (elevate) your feet for 15 minutes, 3-4 times a day. ? Limit salt in your food. Prenatal care  Write down your questions. Take them to your prenatal visits.  Keep all your prenatal visits as told by your doctor. This is important. Safety  Wear your seat belt when driving.  Make a list of emergency phone numbers, including numbers for family, friends, the  hospital, and police and fire departments. General instructions  Ask your doctor about the right foods to eat or for help finding a counselor, if you need these services.  Ask your doctor about local prenatal classes. Begin classes before month 6 of your pregnancy.  Do not use hot tubs, steam rooms, or saunas.  Do not douche or use tampons or scented sanitary pads.  Do not cross your legs for long periods of time.  Visit your dentist if you have not done so. Use a soft toothbrush  to brush your teeth. Floss gently.  Avoid all smoking, herbs, and alcohol. Avoid drugs that are not approved by your doctor.  Do not use any products that contain nicotine or tobacco, such as cigarettes and e-cigarettes. If you need help quitting, ask your doctor.  Avoid cat litter boxes and soil used by cats. These carry germs that can cause birth defects in the baby and can cause a loss of your baby (miscarriage) or stillbirth. Contact a doctor if:  You have mild cramps or pressure in your lower belly.  You have pain when you pee (urinate).  You have bad smelling fluid coming from your vagina.  You continue to feel sick to your stomach (nauseous), throw up (vomit), or have watery poop (diarrhea).  You have a nagging pain in your belly area.  You feel dizzy. Get help right away if:  You have a fever.  You are leaking fluid from your vagina.  You have spotting or bleeding from your vagina.  You have severe belly cramping or pain.  You lose or gain weight rapidly.  You have trouble catching your breath and have chest pain.  You notice sudden or extreme puffiness (swelling) of your face, hands, ankles, feet, or legs.  You have not felt the baby move in over an hour.  You have severe headaches that do not go away when you take medicine.  You have trouble seeing. Summary  The second trimester is from week 14 through week 27 (months 4 through 6). This is often the time in pregnancy that you feel your best.  To take care of yourself and your unborn baby, you will need to eat healthy meals, take medicines only if your doctor tells you to do so, and do activities that are safe for you and your baby.  Call your doctor if you get sick or if you notice anything unusual about your pregnancy. Also, call your doctor if you need help with the right food to eat, or if you want to know what activities are safe for you. This information is not intended to replace advice given to you by  your health care provider. Make sure you discuss any questions you have with your health care provider. Document Revised: 01/01/2019 Document Reviewed: 10/15/2016 Elsevier Patient Education  [place]. Common Medications Safe in Pregnancy  Acne:      Constipation:  Benzoyl Peroxide     Colace  Clindamycin      Dulcolax Suppository  Topica Erythromycin     Fibercon  Salicylic Acid      Metamucil         Miralax AVOID:        Senakot   Accutane    Cough:  Retin-A       Cough Drops  Tetracycline      Phenergan w/ Codeine if Rx  Minocycline      Robitussin (Plain & DM)  Antibiotics:     Crabs/Lice:  Ceclor       RID  Cephalosporins    AVOID:  E-Mycins      Kwell  Keflex  Macrobid/Macrodantin   Diarrhea:  Penicillin      Kao-Pectate  Zithromax      Imodium AD         PUSH FLUIDS AVOID:       Cipro     Fever:  Tetracycline      Tylenol (Regular or Extra  Minocycline       Strength)  Levaquin      Extra Strength-Do not          Exceed 8 tabs/24 hrs Caffeine:        <259m/day (equiv. To 1 cup of coffee or  approx. 3 12 oz sodas)         Gas: Cold/Hayfever:       Gas-X  Benadryl      Mylicon  Claritin       Phazyme  **Claritin-D        Chlor-Trimeton    Headaches:  Dimetapp      ASA-Free Excedrin  Drixoral-Non-Drowsy     Cold Compress  Mucinex (Guaifenasin)     Tylenol (Regular or Extra  Sudafed/Sudafed-12 Hour     Strength)  **Sudafed PE Pseudoephedrine   Tylenol Cold & Sinus     Vicks Vapor Rub  Zyrtec  **AVOID if Problems With Blood Pressure         Heartburn: Avoid lying down for at least 1 hour after meals  Aciphex      Maalox     Rash:  Milk of Magnesia     Benadryl    Mylanta       1% Hydrocortisone Cream  Pepcid  Pepcid Complete   Sleep Aids:  Prevacid      Ambien   Prilosec       Benadryl  Rolaids       Chamomile Tea  Tums (Limit 4/day)     Unisom         Tylenol PM         Warm milk-add vanilla or  Hemorrhoids:       Sugar for  taste  Anusol/Anusol H.C.  (RX: Analapram 2.5%)  Sugar Substitutes:  Hydrocortisone OTC     Ok in moderation  Preparation H      Tucks        Vaseline lotion applied to tissue with wiping    Herpes:     Throat:  Acyclovir      Oragel  Famvir  Valtrex     Vaccines:         Flu Shot Leg Cramps:       *Gardasil  Benadryl      Hepatitis A         Hepatitis B Nasal Spray:       Pneumovax  Saline Nasal Spray     Polio Booster         Tetanus Nausea:       Tuberculosis test or PPD  Vitamin B6 25 mg TID   AVOID:    Dramamine      *Gardasil  Emetrol       Live Poliovirus  Ginger Root 250 mg QID    MMR (measles, mumps &  High Complex Carbs @ Bedtime    rebella)  Sea Bands-Accupressure    Varicella (Chickenpox)  Unisom 1/2 tab TID     *No known complications  If received before Pain:         Known pregnancy;   Darvocet       Resume series after  Lortab        Delivery  Percocet    Yeast:   Tramadol      Femstat  Tylenol 3      Gyne-lotrimin  Ultram       Monistat  Vicodin           MISC:         All Sunscreens           Hair Coloring/highlights          Insect Repellant's          (Including DEET)         Mystic Tans

## 2020-06-14 NOTE — Progress Notes (Signed)
ROB-Pt present for routine prenatal care. Pt stated that having some pain in lower abd area when she stands up to fast no other issues.

## 2020-06-15 ENCOUNTER — Ambulatory Visit: Payer: Self-pay | Admitting: Obstetrics and Gynecology

## 2020-06-15 ENCOUNTER — Other Ambulatory Visit: Payer: Self-pay

## 2020-06-15 ENCOUNTER — Encounter: Payer: Self-pay | Admitting: Obstetrics and Gynecology

## 2020-06-15 VITALS — BP 124/81 | HR 98 | Wt 289.4 lb

## 2020-06-15 DIAGNOSIS — Z3402 Encounter for supervision of normal first pregnancy, second trimester: Secondary | ICD-10-CM

## 2020-06-15 DIAGNOSIS — Z3A18 18 weeks gestation of pregnancy: Secondary | ICD-10-CM

## 2020-06-15 LAB — POCT URINALYSIS DIPSTICK OB
Bilirubin, UA: NEGATIVE
Blood, UA: NEGATIVE
Glucose, UA: NEGATIVE
Ketones, UA: NEGATIVE
Leukocytes, UA: NEGATIVE
Nitrite, UA: NEGATIVE
POC,PROTEIN,UA: NEGATIVE
Spec Grav, UA: 1.01 (ref 1.010–1.025)
Urobilinogen, UA: 0.2 E.U./dL
pH, UA: 7.5 (ref 5.0–8.0)

## 2020-06-15 NOTE — Progress Notes (Signed)
ROB: Does note some mild pain in lower abdomen but not worrisome. Patient declines AFP today. Notes she received her MaterniT21 genetic testing results, but was billed ~$1100.  Has applied for Medicaid but was denied. Will contact company.  Has questions about midwifery vs MD care, delivery positions. Answered questions. H/o Hodgkin's lymphoma noted in patient's chart, last visit with Oncology was 05/2019. No concerns.  RTC in 2 weeks for anatomy scan, 4 weeks for OB visit.

## 2020-06-16 LAB — CBC
Hematocrit: 36 % (ref 34.0–46.6)
Hemoglobin: 11.7 g/dL (ref 11.1–15.9)
MCH: 26.8 pg (ref 26.6–33.0)
MCHC: 32.5 g/dL (ref 31.5–35.7)
MCV: 82 fL (ref 79–97)
Platelets: 269 10*3/uL (ref 150–450)
RBC: 4.37 x10E6/uL (ref 3.77–5.28)
RDW: 14.3 % (ref 11.7–15.4)
WBC: 8.9 10*3/uL (ref 3.4–10.8)

## 2020-06-16 LAB — RPR: RPR Ser Ql: NONREACTIVE

## 2020-06-16 LAB — GLUCOSE, 1 HOUR GESTATIONAL: Gestational Diabetes Screen: 94 mg/dL (ref 65–139)

## 2020-06-17 ENCOUNTER — Encounter: Payer: Self-pay | Admitting: Obstetrics and Gynecology

## 2020-06-26 ENCOUNTER — Encounter: Payer: Self-pay | Admitting: Surgical

## 2020-06-29 ENCOUNTER — Other Ambulatory Visit: Payer: Self-pay

## 2020-06-29 ENCOUNTER — Ambulatory Visit (INDEPENDENT_AMBULATORY_CARE_PROVIDER_SITE_OTHER): Payer: Self-pay

## 2020-06-29 DIAGNOSIS — Z3402 Encounter for supervision of normal first pregnancy, second trimester: Secondary | ICD-10-CM

## 2020-06-29 DIAGNOSIS — Z3A20 20 weeks gestation of pregnancy: Secondary | ICD-10-CM

## 2020-07-11 ENCOUNTER — Telehealth: Payer: Self-pay

## 2020-07-11 NOTE — Telephone Encounter (Signed)
Called pt. I spoke to [employer] and she stated that the pt will be self pay until the insurance kickes in 09/23/2020. I lmtrc made pt ob payment

## 2020-07-13 ENCOUNTER — Other Ambulatory Visit: Payer: Self-pay

## 2020-07-13 ENCOUNTER — Encounter: Payer: Self-pay | Admitting: Obstetrics and Gynecology

## 2020-07-20 ENCOUNTER — Other Ambulatory Visit: Payer: Self-pay

## 2020-07-20 ENCOUNTER — Ambulatory Visit (INDEPENDENT_AMBULATORY_CARE_PROVIDER_SITE_OTHER): Payer: Self-pay

## 2020-07-20 ENCOUNTER — Other Ambulatory Visit: Payer: Self-pay | Admitting: Obstetrics and Gynecology

## 2020-07-20 ENCOUNTER — Encounter: Payer: Self-pay | Admitting: Obstetrics and Gynecology

## 2020-07-20 ENCOUNTER — Ambulatory Visit (INDEPENDENT_AMBULATORY_CARE_PROVIDER_SITE_OTHER): Payer: Self-pay | Admitting: Obstetrics and Gynecology

## 2020-07-20 VITALS — BP 139/85 | HR 94 | Wt 294.6 lb

## 2020-07-20 DIAGNOSIS — Z3A23 23 weeks gestation of pregnancy: Secondary | ICD-10-CM

## 2020-07-20 DIAGNOSIS — Z09 Encounter for follow-up examination after completed treatment for conditions other than malignant neoplasm: Secondary | ICD-10-CM

## 2020-07-20 DIAGNOSIS — Z3402 Encounter for supervision of normal first pregnancy, second trimester: Secondary | ICD-10-CM

## 2020-07-20 NOTE — Progress Notes (Signed)
ROB: 1 hour GCT next visit.  MaterniT 21 billing fixed.  Patient completed FAS today.  We discussed vaccinations for children, I recommended Covid vaccine during pregnancy as per acog.  At patient request I discussed vaginal versus cesarean delivery.

## 2020-08-24 ENCOUNTER — Encounter: Payer: Self-pay | Admitting: Obstetrics and Gynecology

## 2020-08-24 ENCOUNTER — Ambulatory Visit (INDEPENDENT_AMBULATORY_CARE_PROVIDER_SITE_OTHER): Payer: Self-pay | Admitting: Obstetrics and Gynecology

## 2020-08-24 ENCOUNTER — Other Ambulatory Visit: Payer: Self-pay | Admitting: Surgical

## 2020-08-24 ENCOUNTER — Other Ambulatory Visit: Payer: Self-pay

## 2020-08-24 VITALS — BP 125/83 | HR 112 | Wt 299.2 lb

## 2020-08-24 DIAGNOSIS — Z3A28 28 weeks gestation of pregnancy: Secondary | ICD-10-CM

## 2020-08-24 DIAGNOSIS — Z3402 Encounter for supervision of normal first pregnancy, second trimester: Secondary | ICD-10-CM

## 2020-08-24 DIAGNOSIS — Z3403 Encounter for supervision of normal first pregnancy, third trimester: Secondary | ICD-10-CM

## 2020-08-24 LAB — POCT URINALYSIS DIPSTICK OB
Bilirubin, UA: NEGATIVE
Blood, UA: NEGATIVE
Glucose, UA: NEGATIVE
Ketones, UA: NEGATIVE
Leukocytes, UA: NEGATIVE
Nitrite, UA: NEGATIVE
POC,PROTEIN,UA: NEGATIVE
Spec Grav, UA: 1.01 (ref 1.010–1.025)
Urobilinogen, UA: 0.2 E.U./dL
pH, UA: 7.5 (ref 5.0–8.0)

## 2020-08-24 NOTE — Progress Notes (Signed)
ROB-Pt present for routine prenatal care. Pt stated having braxton hick contractions, lower back and hip pains. BTC signed by pt. Pt requested to have tdap/flu vaccines completed at next visit at 30 weeks.

## 2020-08-24 NOTE — Progress Notes (Signed)
ROB: Overall doing well. Does note some headaches, leg cramps and [facility], but all usually improve after drinking water. Encouraged adequate daily hydration. Desires to hold off on flu vaccine until next visit as she usually gets very sick for several days (being a cancer survivor) and would need to take 1-2 days off work.  Also still very concerned about effects of getting COVID vaccine in pregnancy with her history and desires to hold off.  Also reportsthat she desires a more natural childbirth. Wonders if she should consider switching to midwifery.  Discussed her concerns, advised that she could have a natural childbirth with either service, and could also enroll the aid of a doula. Given birth plan info for guidance. Patient happy with information. For 28 week labs today.  Desires to breastfeed, to discuss contraception next visit. Will hold on Tdap until next visit, signed blood consent. Still also working to apply for Medicaid as she is self-pay. RTC in 2 weeks.

## 2020-08-24 NOTE — Patient Instructions (Addendum)
Third Trimester of Pregnancy The third trimester is from week 28 through week 40 (months 7 through 9). The third trimester is a time when the unborn baby (fetus) is growing rapidly. At the end of the ninth month, the fetus is about 20 inches in length and weighs 6-10 pounds. Body changes during your third trimester Your body will continue to go through many changes during pregnancy. The changes vary from woman to woman. During the third trimester:  Your weight will continue to increase. You can expect to gain 25-35 pounds (11-16 kg) by the end of the pregnancy.  You may begin to get stretch marks on your hips, abdomen, and breasts.  You may urinate more often because the fetus is moving lower into your pelvis and pressing on your bladder.  You may develop or continue to have heartburn. This is caused by increased hormones that slow down muscles in the digestive tract.  You may develop or continue to have constipation because increased hormones slow digestion and cause the muscles that push waste through your intestines to relax.  You may develop hemorrhoids. These are swollen veins (varicose veins) in the rectum that can itch or be painful.  You may develop swollen, bulging veins (varicose veins) in your legs.  You may have increased body aches in the pelvis, back, or thighs. This is due to weight gain and increased hormones that are relaxing your joints.  You may have changes in your hair. These can include thickening of your hair, rapid growth, and changes in texture. Some women also have hair loss during or after pregnancy, or hair that feels dry or thin. Your hair will most likely return to normal after your baby is born.  Your breasts will continue to grow and they will continue to become tender. A yellow fluid (colostrum) may leak from your breasts. This is the first milk you are producing for your baby.  Your belly button may stick out.  You may notice more swelling in your hands,  face, or ankles.  You may have increased tingling or numbness in your hands, arms, and legs. The skin on your belly may also feel numb.  You may feel short of breath because of your expanding uterus.  You may have more problems sleeping. This can be caused by the size of your belly, increased need to urinate, and an increase in your body's metabolism.  You may notice the fetus "dropping," or moving lower in your abdomen (lightening).  You may have increased vaginal discharge.  You may notice your joints feel loose and you may have pain around your pelvic bone. What to expect at prenatal visits You will have prenatal exams every 2 weeks until week 36. Then you will have weekly prenatal exams. During a routine prenatal visit:  You will be weighed to make sure you and the baby are growing normally.  Your blood pressure will be taken.  Your abdomen will be measured to track your baby's growth.  The fetal heartbeat will be listened to.  Any test results from the previous visit will be discussed.  You may have a cervical check near your due date to see if your cervix has softened or thinned (effaced).  You will be tested for Group B streptococcus. This happens between 35 and 37 weeks. Your health care provider may ask you:  What your birth plan is.  How you are feeling.  If you are feeling the baby move.  If you have had any abnormal  symptoms, such as leaking fluid, bleeding, severe headaches, or abdominal cramping.  If you are using any tobacco products, including cigarettes, chewing tobacco, and electronic cigarettes.  If you have any questions. Other tests or screenings that may be performed during your third trimester include:  Blood tests that check for low iron levels (anemia).  Fetal testing to check the health, activity level, and growth of the fetus. Testing is done if you have certain medical conditions or if there are problems during the pregnancy.  Nonstress test  (NST). This test checks the health of your baby to make sure there are no signs of problems, such as the baby not getting enough oxygen. During this test, a belt is placed around your belly. The baby is made to move, and its heart rate is monitored during movement. What is false labor? False labor is a condition in which you feel small, irregular tightenings of the muscles in the womb (contractions) that usually go away with rest, changing position, or drinking water. These are called Braxton Hicks contractions. Contractions may last for hours, days, or even weeks before true labor sets in. If contractions come at regular intervals, become more frequent, increase in intensity, or become painful, you should see your health care provider. What are the signs of labor?  Abdominal cramps.  Regular contractions that start at 10 minutes apart and become stronger and more frequent with time.  Contractions that start on the top of the uterus and spread down to the lower abdomen and back.  Increased pelvic pressure and dull back pain.  A watery or bloody mucus discharge that comes from the vagina.  Leaking of amniotic fluid. This is also known as your "water breaking." It could be a slow trickle or a gush. Let your health care provider know if it has a color or strange odor. If you have any of these signs, call your health care provider right away, even if it is before your due date. Follow these instructions at home: Medicines  Follow your health care provider's instructions regarding medicine use. Specific medicines may be either safe or unsafe to take during pregnancy.  Take a prenatal vitamin that contains at least 600 micrograms (mcg) of folic acid.  If you develop constipation, try taking a stool softener if your health care provider approves. Eating and drinking   Eat a balanced diet that includes fresh fruits and vegetables, whole grains, good sources of protein such as meat, eggs, or tofu,  and low-fat dairy. Your health care provider will help you determine the amount of weight gain that is right for you.  Avoid raw meat and uncooked cheese. These carry germs that can cause birth defects in the baby.  If you have low calcium intake from food, talk to your health care provider about whether you should take a daily calcium supplement.  Eat four or five small meals rather than three large meals a day.  Limit foods that are high in fat and processed sugars, such as fried and sweet foods.  To prevent constipation: ? Drink enough fluid to keep your urine clear or pale yellow. ? Eat foods that are high in fiber, such as fresh fruits and vegetables, whole grains, and beans. Activity  Exercise only as directed by your health care provider. Most women can continue their usual exercise routine during pregnancy. Try to exercise for 30 minutes at least 5 days a week. Stop exercising if you experience uterine contractions.  Avoid heavy lifting.  Do  not exercise in extreme heat or humidity, or at high altitudes.  Wear low-heel, comfortable shoes.  Practice good posture.  You may continue to have sex unless your health care provider tells you otherwise. Relieving pain and discomfort  Take frequent breaks and rest with your legs elevated if you have leg cramps or low back pain.  Take warm sitz baths to soothe any pain or discomfort caused by hemorrhoids. Use hemorrhoid cream if your health care provider approves.  Wear a good support bra to prevent discomfort from breast tenderness.  If you develop varicose veins: ? Wear support pantyhose or compression stockings as told by your healthcare provider. ? Elevate your feet for 15 minutes, 3-4 times a day. Prenatal care  Write down your questions. Take them to your prenatal visits.  Keep all your prenatal visits as told by your health care provider. This is important. Safety  Wear your seat belt at all times when driving.  Make  a list of emergency phone numbers, including numbers for family, friends, the hospital, and police and fire departments. General instructions  Avoid cat litter boxes and soil used by cats. These carry germs that can cause birth defects in the baby. If you have a cat, ask someone to clean the litter box for you.  Do not travel far distances unless it is absolutely necessary and only with the approval of your health care provider.  Do not use hot tubs, steam rooms, or saunas.  Do not drink alcohol.  Do not use any products that contain nicotine or tobacco, such as cigarettes and e-cigarettes. If you need help quitting, ask your health care provider.  Do not use any medicinal herbs or unprescribed drugs. These chemicals affect the formation and growth of the baby.  Do not douche or use tampons or scented sanitary pads.  Do not cross your legs for long periods of time.  To prepare for the arrival of your baby: ? Take prenatal classes to understand, practice, and ask questions about labor and delivery. ? Make a trial run to the hospital. ? Visit the hospital and tour the maternity area. ? Arrange for maternity or paternity leave through employers. ? Arrange for family and friends to take care of pets while you are in the hospital. ? Purchase a rear-facing car seat and make sure you know how to install it in your car. ? Pack your hospital bag. ? Prepare the baby's nursery. Make sure to remove all pillows and stuffed animals from the baby's crib to prevent suffocation.  Visit your dentist if you have not gone during your pregnancy. Use a soft toothbrush to brush your teeth and be gentle when you floss. Contact a health care provider if:  You are unsure if you are in labor or if your water has broken.  You become dizzy.  You have mild pelvic cramps, pelvic pressure, or nagging pain in your abdominal area.  You have lower back pain.  You have persistent nausea, vomiting, or  diarrhea.  You have an unusual or bad smelling vaginal discharge.  You have pain when you urinate. Get help right away if:  Your water breaks before 37 weeks.  You have regular contractions less than 5 minutes apart before 37 weeks.  You have a fever.  You are leaking fluid from your vagina.  You have spotting or bleeding from your vagina.  You have severe abdominal pain or cramping.  You have rapid weight loss or weight gain.  You have  shortness of breath with chest pain.  You notice sudden or extreme swelling of your face, hands, ankles, feet, or legs.  Your baby makes fewer than 10 movements in 2 hours.  You have severe headaches that do not go away when you take medicine.  You have vision changes. Summary  The third trimester is from week 28 through week 40, months 7 through 9. The third trimester is a time when the unborn baby (fetus) is growing rapidly.  During the third trimester, your discomfort may increase as you and your baby continue to gain weight. You may have abdominal, leg, and back pain, sleeping problems, and an increased need to urinate.  During the third trimester your breasts will keep growing and they will continue to become tender. A yellow fluid (colostrum) may leak from your breasts. This is the first milk you are producing for your baby.  False labor is a condition in which you feel small, irregular tightenings of the muscles in the womb (contractions) that eventually go away. These are called Braxton Hicks contractions. Contractions may last for hours, days, or even weeks before true labor sets in.  Signs of labor can include: abdominal cramps; regular contractions that start at 10 minutes apart and become stronger and more frequent with time; watery or bloody mucus discharge that comes from the vagina; increased pelvic pressure and dull back pain; and leaking of amniotic fluid. This information is not intended to replace advice given to you by your  health care provider. Make sure you discuss any questions you have with your health care provider. Document Revised: 12/31/2018 Document Reviewed: 10/15/2016 Elsevier Patient Education  [place].   Common Medications Safe in Pregnancy  Acne:      Constipation:  Benzoyl Peroxide     Colace  Clindamycin      Dulcolax Suppository  Topica Erythromycin     Fibercon  Salicylic Acid      Metamucil         Miralax AVOID:        Senakot   Accutane    Cough:  Retin-A       Cough Drops  Tetracycline      Phenergan w/ Codeine if Rx  Minocycline      Robitussin (Plain & DM)  Antibiotics:     Crabs/Lice:  Ceclor       RID  Cephalosporins    AVOID:  E-Mycins      Kwell  Keflex  Macrobid/Macrodantin   Diarrhea:  Penicillin      Kao-Pectate  Zithromax      Imodium AD         PUSH FLUIDS AVOID:       Cipro     Fever:  Tetracycline      Tylenol (Regular or Extra  Minocycline       Strength)  Levaquin      Extra Strength-Do not          Exceed 8 tabs/24 hrs Caffeine:        '200mg'$ /day (equiv. To 1 cup of coffee or  approx. 3 12 oz sodas)         Gas: Cold/Hayfever:       Gas-X  Benadryl      Mylicon  Claritin       Phazyme  **Claritin-D        Chlor-Trimeton    Headaches:  Dimetapp      ASA-Free Excedrin  Drixoral-Non-Drowsy     Cold Compress  Mucinex (Guaifenasin)     Tylenol (Regular or Extra  Sudafed/Sudafed-12 Hour     Strength)  **Sudafed PE Pseudoephedrine   Tylenol Cold & Sinus     Vicks Vapor Rub  Zyrtec  **AVOID if Problems With Blood Pressure         Heartburn: Avoid lying down for at least 1 hour after meals  Aciphex      Maalox     Rash:  Milk of Magnesia     Benadryl    Mylanta       1% Hydrocortisone Cream  Pepcid  Pepcid Complete   Sleep Aids:  Prevacid      Ambien   Prilosec       Benadryl  Rolaids       Chamomile Tea  Tums (Limit 4/day)     Unisom         Tylenol PM         Warm milk-add vanilla or  Hemorrhoids:       Sugar for  taste  Anusol/Anusol H.C.  (RX: Analapram 2.5%)  Sugar Substitutes:  Hydrocortisone OTC     Ok in moderation  Preparation H      Tucks        Vaseline lotion applied to tissue with wiping    Herpes:     Throat:  Acyclovir      Oragel  Famvir  Valtrex     Vaccines:         Flu Shot Leg Cramps:       *Gardasil  Benadryl      Hepatitis A         Hepatitis B Nasal Spray:       Pneumovax  Saline Nasal Spray     Polio Booster         Tetanus Nausea:       Tuberculosis test or PPD  Vitamin B6 25 mg TID   AVOID:    Dramamine      *Gardasil  Emetrol       Live Poliovirus  Ginger Root 250 mg QID    MMR (measles, mumps &  High Complex Carbs @ Bedtime    rebella)  Sea Bands-Accupressure    Varicella (Chickenpox)  Unisom 1/2 tab TID     *No known complications           If received before Pain:         Known pregnancy;   Darvocet       Resume series after  Lortab        Delivery  Percocet    Yeast:   Tramadol      Femstat  Tylenol 3      Gyne-lotrimin  Ultram       Monistat  Vicodin           MISC:         All Sunscreens           Hair Coloring/highlights          Insect Repellant's          (Including DEET)         Mystic Tans

## 2020-08-25 LAB — CBC
Hematocrit: 27.4 % — ABNORMAL LOW (ref 34.0–46.6)
Hemoglobin: 8.7 g/dL — ABNORMAL LOW (ref 11.1–15.9)
MCH: 24.5 pg — ABNORMAL LOW (ref 26.6–33.0)
MCHC: 31.8 g/dL (ref 31.5–35.7)
MCV: 77 fL — ABNORMAL LOW (ref 79–97)
Platelets: 272 10*3/uL (ref 150–450)
RBC: 3.55 x10E6/uL — ABNORMAL LOW (ref 3.77–5.28)
RDW: 15 % (ref 11.7–15.4)
WBC: 10 10*3/uL (ref 3.4–10.8)

## 2020-08-25 LAB — GLUCOSE, 1 HOUR GESTATIONAL: Gestational Diabetes Screen: 124 mg/dL (ref 65–139)

## 2020-08-25 LAB — RPR: RPR Ser Ql: NONREACTIVE

## 2020-09-07 ENCOUNTER — Encounter: Payer: Self-pay | Admitting: Obstetrics and Gynecology

## 2020-09-07 ENCOUNTER — Ambulatory Visit (INDEPENDENT_AMBULATORY_CARE_PROVIDER_SITE_OTHER): Payer: Self-pay | Admitting: Obstetrics and Gynecology

## 2020-09-07 ENCOUNTER — Other Ambulatory Visit: Payer: Self-pay

## 2020-09-07 VITALS — BP 130/68 | HR 116 | Wt 300.9 lb

## 2020-09-07 DIAGNOSIS — D509 Iron deficiency anemia, unspecified: Secondary | ICD-10-CM

## 2020-09-07 DIAGNOSIS — Z3403 Encounter for supervision of normal first pregnancy, third trimester: Secondary | ICD-10-CM

## 2020-09-07 DIAGNOSIS — Z3A30 30 weeks gestation of pregnancy: Secondary | ICD-10-CM

## 2020-09-07 DIAGNOSIS — Z6841 Body Mass Index (BMI) 40.0 and over, adult: Secondary | ICD-10-CM

## 2020-09-07 NOTE — Progress Notes (Signed)
ROB: Doing much better-has increased hydration and has made a big difference.  CBC noted with anemia.  Patient advised to begin iron twice daily with meals.  Will need to recheck H&H later in pregnancy-possible iron infusions.  BMI at 43 -  If over 45 needs Anes consult.  Patient told that she was not eligible for Medicaid.  She is to reapply postpartum.  She was using daily Colace to regulate her bowel movements.  Recommended fiber type laxatives if necessary.

## 2020-09-23 NOTE — L&D Delivery Note (Signed)
Delivery Summary for Miranda Villegas  Labor Events:   Preterm labor: No data found  Rupture date: 11/13/2020  Rupture time: No data found  Rupture type: Artificial  Fluid Color: Clear  Induction: No data found  Augmentation: No data found  Complications: No data found  Cervical ripening: No data found No data found   No data found     Delivery:   Episiotomy: No data found  Lacerations: No data found  Repair suture: No data found  Repair # of packets: No data found  Blood loss (ml): 400   Information for the patient's newborn:  Richell, Corker [[id number]]    Delivery 11/14/2020 2:22 AM by  Vaginal, Spontaneous Sex:  female Gestational Age: [redacted]w[redacted]d Delivery Clinician:   Living?:         APGARS  One minute Five minutes Ten minutes  Skin color:        Heart rate:        Grimace:        Muscle tone:        Breathing:        Totals: 7  9      Presentation/position:      Resuscitation:   Cord information:    Disposition of cord blood:     Blood gases sent?  Complications:   Placenta: Delivered:       appearance Newborn Measurements: Weight: 7 lb 10.1 oz (3460 g)  Height: 19.5"  Head circumference:    Chest circumference:    Other providers:    Additional  information: Forceps:   Vacuum:   Breech:   Observed anomalies       Delivery Note At 2:22 AM a viable and healthy female was delivered via Vaginal, Spontaneous (Presentation: Left Occiput Anterior).  APGAR: 7, 9; weight 7 lb 10.1 oz (3460 g).   Placenta status: Spontaneous, Intact.  Cord: 3 vessels with the following complications: None.  Cord pH: not obtained. Delayed cord clamping observed for 5 minutes.   Anesthesia: Epidural Episiotomy: None Lacerations: 1st degree labial at edge of perineum Suture Repair: None Est. Blood Loss (mL): 400  Mom to postpartum.  Baby to Couplet care / Skin to Skin.  Rubie Maid, MD 11/14/2020, 2:53 AM

## 2020-09-28 ENCOUNTER — Other Ambulatory Visit: Payer: Self-pay

## 2020-09-28 ENCOUNTER — Encounter: Payer: Self-pay | Admitting: Obstetrics and Gynecology

## 2020-09-28 ENCOUNTER — Ambulatory Visit (INDEPENDENT_AMBULATORY_CARE_PROVIDER_SITE_OTHER): Payer: HRSA Program | Admitting: Obstetrics and Gynecology

## 2020-09-28 DIAGNOSIS — O98513 Other viral diseases complicating pregnancy, third trimester: Secondary | ICD-10-CM | POA: Diagnosis not present

## 2020-09-28 DIAGNOSIS — U071 COVID-19: Secondary | ICD-10-CM | POA: Diagnosis not present

## 2020-09-28 DIAGNOSIS — Z8571 Personal history of Hodgkin lymphoma: Secondary | ICD-10-CM | POA: Diagnosis not present

## 2020-09-28 DIAGNOSIS — O0993 Supervision of high risk pregnancy, unspecified, third trimester: Secondary | ICD-10-CM | POA: Diagnosis not present

## 2020-09-28 DIAGNOSIS — D509 Iron deficiency anemia, unspecified: Secondary | ICD-10-CM

## 2020-09-28 NOTE — Progress Notes (Signed)
Televisit- Pt COVID+ ROB-Pt present for routine prenatal care via televisit.

## 2020-09-28 NOTE — Patient Instructions (Signed)
Pregnancy and COVID-19 Coronavirus disease, also called COVID-19, is an infection of the lungs and airways (respiratory tract). It is unclear at this time if pregnancy makes it more likely for you to get COVID-19, or what effects the infection may have on your unborn baby. However, pregnancy causes changes to your heart, lungs, and your body's disease-fighting system (immune system). Some of these changes make it more likely for you to get sick and have more serious illness. Therefore, it is important for you to take precautions in order to protect yourself and your unborn baby. There have been studies showing that obesity and diabetes may put you at higher risk for serious illness. If you are pregnant and are obese or have diabetes, you should take extra precautions to protect yourself from the virus. Work with your health care team to develop a plan to protect yourself from all infections, including COVID-19. This is one way for you to stay healthy during your pregnancy and to keep your baby healthy as well. How does this affect me? If you get COVID-19, there is a risk that you may:  Get a respiratory illness that can lead to pneumonia.  Give birth to your baby before 37 weeks of pregnancy (premature birth). If you have or may have COVID-19, your health care provider may recommend special precautions around your pregnancy. This may affect how you:  Receive care before delivery (prenatal care). How you visit your health care provider may change. Tests and scans may need to be performed differently.  Receive care during labor and delivery. This may affect your birth plan, including who may be with you during labor and delivery.  Receive care after you deliver your baby (postpartum care). You may stay longer in the hospital and in a special room.  Feed your baby after he or she is born. Pregnancy can be an especially stressful time because of the changes in your body and the preparation involved in  becoming a parent. In addition, you may be feeling especially fearful, anxious, or stressed because of COVID-19 and how it is affecting you. How does this affect my baby? It is not known whether a mother will transmit the virus to her unborn baby. There is a risk that if you get COVID-19:  The virus that causes COVID-19 can pass to your baby.  You may have premature birth. Your baby may require more medical care if this happens. What can I do to lower my risk?  There is no vaccine to help prevent COVID-19. However, there are actions that you can take to protect yourself and others from this virus. Cleaning and personal hygiene  Wash your hands often with soap and water for at least 20 seconds. If soap and water are not available, use alcohol-based hand sanitizer.  Avoid touching your mouth, face, eyes, or nose.  Clean and disinfect objects and surfaces that are frequently touched every day. These may include: ? Counters and tables. ? Doorknobs and light switches. ? Sinks and faucets. ? Electronics such as phones, remote controls, keyboards, computers, and tablets. Stay away from others  Stay away from people who are sick, if possible.  Avoid social gatherings and travel.  Stay home as much as possible. Follow these instructions: Breastfeeding It is not known if the virus that causes COVID-19 can pass through breast milk to your baby. You should make a plan for feeding your infant with your family and your health care team. If you have or may have COVID-19,  your health care provider may recommend that you take precautions while breastfeeding, such as:  Washing your hands before feeding your baby.  Wearing a mask while feeding your baby.  Pumping or expressing breast milk to feed to your baby. If possible, ask someone in your household who is not sick to feed your baby the expressed breast milk. ? Wash your hands before touching pump parts. ? Wash and disinfect all pump parts  after expressing milk. Follow the manufacturer's instructions to clean and disinfect all pump parts. General instructions  If you think you have a COVID-19 infection, contact your health care provider right away. Tell your health care provider that you think you may have a COVID-19 infection.  Follow your health care provider's instructions on taking medicines. Some medicines may be unsafe to take during pregnancy.  Cover your mouth and nose by wearing a mask or other cloth covering over your face when you go out in public.  Find ways to manage stress. These may include: ? Using relaxation techniques like meditation and deep breathing. ? Getting regular exercise. Most women can continue their usual exercise routine during pregnancy. Ask your health care provider what activities are safe for you. ? Seeking support from family, friends, or spiritual resources. If you cannot be together in person, you can still connect by phone calls, texts, video calls, or online messaging. ? Spending time doing relaxing activities that you enjoy, like listening to music or reading a good book.  Ask for help if you have counseling or nutritional needs during pregnancy. Your health care provider can offer advice or refer you to resources or specialists who can help you with various needs.  Keep all follow-up visits as told by your health care provider. This is important. Where to find more information Centers for Disease Control and Prevention (CDC): http://gutierrez-robinson.com/ World Health Organization [facility]): https://www.morales.com/ [employer]necologists (ACOG): StickerEmporium.tn Questions to ask your health care team  What should I do if I have COVID-19 symptoms?  How will COVID-19 affect my prenatal care visits, tests and scans, labor and  delivery, and postpartum care?  Should I plan to breastfeed my baby?  Where can I find mental health resources?  Where can I find support if I have financial concerns? Contact a health care provider if:  You have signs and symptoms of infection, including a fever or cough. Tell your health care team that you think you may have a COVID-19 infection.  You have strong emotions, such as sadness or anxiety.  You feel unsafe in your home and need help finding a safe place to live.  You have bloody or watery vaginal discharge or vaginal bleeding. Get help right away if:  You have signs or symptoms of labor before 37 weeks of pregnancy. These include: ? Contractions that are 5 minutes or less apart, or that increase in frequency, intensity, or length. ? Sudden, sharp pain in the abdomen or in the lower back. ? A gush or trickle of fluid from your vagina.  You have signs of more serious illness such as: ? You have difficulty breathing. ? You have chest pain. ? You have a fever greater than 102F (39C) or higher that does not go away. ? You cannot drink fluids without vomiting. ? You feel extremely weak or you faint. These symptoms may represent a serious problem that is an emergency. Do not wait to see if the symptoms will go away. Get medical help right away. Call  your local emergency services (911 in the [place] Do not drive yourself to the hospital. Summary  Coronavirus disease, also called COVID-19, is an infection of the lungs and airways (respiratory tract). It is unclear at this time if pregnancy makes you more susceptible to COVID-19 and what effects it may have on unborn babies.  It is important to take precautions to protect yourself and your developing baby. This includes washing your hands often, avoiding touching your mouth, face, eyes, or nose, avoiding social gatherings and travel, and staying away from people who are sick.  If you think you have a COVID-19 infection,  contact your health care provider right away. Tell your health care provider that you think you may have a COVID-19 infection.  If you have or may have COVID-19, your health care provider may recommend special precautions during your pregnancy, labor and delivery, and after your baby is born. This information is not intended to replace advice given to you by your health care provider. Make sure you discuss any questions you have with your health care provider. Document Revised: 07/02/2019 Document Reviewed: 01/05/2019 Elsevier Patient Education  2020 [employer].    Third Trimester of Pregnancy  The third trimester is from week 28 through week 40 (months 7 through 9). This trimester is when your unborn baby (fetus) is growing very fast. At the end of the ninth month, the unborn baby is about 20 inches in length. It weighs about 6-10 pounds. Follow these instructions at home: Medicines  Take over-the-counter and prescription medicines only as told by your doctor. Some medicines are safe and some medicines are not safe during pregnancy.  Take a prenatal vitamin that contains at least 600 micrograms (mcg) of folic acid.  If you have trouble pooping (constipation), take medicine that will make your stool soft (stool softener) if your doctor approves. Eating and drinking   Eat regular, healthy meals.  Avoid raw meat and uncooked cheese.  If you get low calcium from the food you eat, talk to your doctor about taking a daily calcium supplement.  Eat four or five small meals rather than three large meals a day.  Avoid foods that are high in fat and sugars, such as fried and sweet foods.  To prevent constipation: ? Eat foods that are high in fiber, like fresh fruits and vegetables, whole grains, and beans. ? Drink enough fluids to keep your pee (urine) clear or pale yellow. Activity  Exercise only as told by your doctor. Stop exercising if you start to have cramps.  Avoid heavy  lifting, wear low heels, and sit up straight.  Do not exercise if it is too hot, too humid, or if you are in a place of great height (high altitude).  You may continue to have sex unless your doctor tells you not to. Relieving pain and discomfort  Wear a good support bra if your breasts are tender.  Take frequent breaks and rest with your legs raised if you have leg cramps or low back pain.  Take warm water baths (sitz baths) to soothe pain or discomfort caused by hemorrhoids. Use hemorrhoid cream if your doctor approves.  If you develop puffy, bulging veins (varicose veins) in your legs: ? Wear support hose or compression stockings as told by your doctor. ? Raise (elevate) your feet for 15 minutes, 3-4 times a day. ? Limit salt in your food. Safety  Wear your seat belt when driving.  Make a list of emergency phone numbers, including  numbers for family, friends, the hospital, and police and fire departments. Preparing for your baby's arrival To prepare for the arrival of your baby:  Take prenatal classes.  Practice driving to the hospital.  Visit the hospital and tour the maternity area.  Talk to your work about taking leave once the baby comes.  Pack your hospital bag.  Prepare the baby's room.  Go to your doctor visits.  Buy a rear-facing car seat. Learn how to install it in your car. General instructions  Do not use hot tubs, steam rooms, or saunas.  Do not use any products that contain nicotine or tobacco, such as cigarettes and e-cigarettes. If you need help quitting, ask your doctor.  Do not drink alcohol.  Do not douche or use tampons or scented sanitary pads.  Do not cross your legs for long periods of time.  Do not travel for long distances unless you must. Only do so if your doctor says it is okay.  Visit your dentist if you have not gone during your pregnancy. Use a soft toothbrush to brush your teeth. Be gentle when you floss.  Avoid cat litter  boxes and soil used by cats. These carry germs that can cause birth defects in the baby and can cause a loss of your baby (miscarriage) or stillbirth.  Keep all your prenatal visits as told by your doctor. This is important. Contact a doctor if:  You are not sure if you are in labor or if your water has broken.  You are dizzy.  You have mild cramps or pressure in your lower belly.  You have a nagging pain in your belly area.  You continue to feel sick to your stomach, you throw up, or you have watery poop.  You have bad smelling fluid coming from your vagina.  You have pain when you pee. Get help right away if:  You have a fever.  You are leaking fluid from your vagina.  You are spotting or bleeding from your vagina.  You have severe belly cramps or pain.  You lose or gain weight quickly.  You have trouble catching your breath and have chest pain.  You notice sudden or extreme puffiness (swelling) of your face, hands, ankles, feet, or legs.  You have not felt the baby move in over an hour.  You have severe headaches that do not go away with medicine.  You have trouble seeing.  You are leaking, or you are having a gush of fluid, from your vagina before you are 37 weeks.  You have regular belly spasms (contractions) before you are 37 weeks. Summary  The third trimester is from week 28 through week 40 (months 7 through 9). This time is when your unborn baby is growing very fast.  Follow your doctor's advice about medicine, food, and activity.  Get ready for the arrival of your baby by taking prenatal classes, getting all the baby items ready, preparing the baby's room, and visiting your doctor to be checked.  Get help right away if you are bleeding from your vagina, or you have chest pain and trouble catching your breath, or if you have not felt your baby move in over an hour. This information is not intended to replace advice given to you by your health care  provider. Make sure you discuss any questions you have with your health care provider. Document Revised: 12/31/2018 Document Reviewed: 10/15/2016 Elsevier Patient Education  [place].

## 2020-09-28 NOTE — Progress Notes (Signed)
Virtual Visit via Telephone Note (ROB):   I connected with Miranda Villegas on 09/28/20 at  1:45 PM EST by telephone and verified that I am speaking with the correct person using two identifiers.  Location: Patient: Home Provider: Office   I discussed the limitations, risks, security and privacy concerns of performing an evaluation and management service by telephone and the availability of in person appointments. I also discussed with the patient that there may be a patient responsible charge related to this service. The patient expressed understanding and agreed to proceed.   History of Present Illness: ROB: Televisit performed today in light of patient with new onset of COVID-19 infection, diagnosed today.  Miranda Villegas is a 30 y.o. G1P0 at [redacted]w[redacted]d. Reports congestion and small cough, and fatigue.  Otherwise doing ok.  Pregnancy symptoms include occasional braxton hicks. Notes good fetal movement.  Denies contractions or vaginal bleeding/discharge. Has looked into doula program, but likely won't utilize. Is taking iron for anemia diagnosed in third trimester.    Observations/Objective: Last menstrual period 02/08/2020. Weight unknown.  Last weight 12/16 was 300 lbs 14.4 oz.  Height is 5'9.    Assessment and Plan: COVID + 19 in pregnancy - discussed home treatment measures. To f/u in ER/L&D if symptoms worsen. Also can refer to receive immunoglobulins due to history of cancer survivor.  Supervision of high risk pregnancy - pregnancy overall doing well.  H/o hodgkin's lymphoma - given extra precautions due to COVID infection with prior history.     Follow Up Instructions: RTC in 2 weeks for OB visit, sooner if needed.     I discussed the assessment and treatment plan with the patient. The patient was provided an opportunity to ask questions and all were answered. The patient agreed with the plan and demonstrated an understanding of the instructions.   The patient was  advised to call back or seek an in-person evaluation if the symptoms worsen or if the condition fails to improve as anticipated.  I provided 12 minutes of non-face-to-face time during this encounter.   Hildred Laser, MD

## 2020-09-29 ENCOUNTER — Telehealth: Payer: Self-pay | Admitting: Infectious Diseases

## 2020-09-29 NOTE — Telephone Encounter (Signed)
Called to discuss with patient about COVID-19 symptoms and the use of one of the available treatments for those with mild to moderate Covid symptoms and at a high risk of hospitalization.  Pt appears to qualify for outpatient treatment due to co-morbid conditions and/or a member of an at-risk group in accordance with the FDA Emergency Use Authorization.    Symptom onset: unclear - +1/04 Vaccinated: not documented   Booster?  Qualifiers: pregnancy (currently 3rd trimester), morbid obesity, hx hodgkins lymphoma   Unable to reach pt - LVM and MyChart to patient. Given pregnancy will offer either IV remdesivir course or MAB over oral therapy.   Janene Madeira, NP

## 2020-10-12 ENCOUNTER — Encounter: Payer: Self-pay | Admitting: Obstetrics and Gynecology

## 2020-10-12 ENCOUNTER — Ambulatory Visit (INDEPENDENT_AMBULATORY_CARE_PROVIDER_SITE_OTHER): Payer: Self-pay | Admitting: Obstetrics and Gynecology

## 2020-10-12 ENCOUNTER — Other Ambulatory Visit: Payer: Self-pay

## 2020-10-12 VITALS — BP 135/84 | HR 88 | Wt 303.3 lb

## 2020-10-12 DIAGNOSIS — O0993 Supervision of high risk pregnancy, unspecified, third trimester: Secondary | ICD-10-CM

## 2020-10-12 DIAGNOSIS — Z3A35 35 weeks gestation of pregnancy: Secondary | ICD-10-CM

## 2020-10-12 LAB — POCT URINALYSIS DIPSTICK OB
Bilirubin, UA: NEGATIVE
Blood, UA: NEGATIVE
Glucose, UA: NEGATIVE
Ketones, UA: NEGATIVE
Leukocytes, UA: NEGATIVE
Nitrite, UA: NEGATIVE
Spec Grav, UA: 1.01 (ref 1.010–1.025)
Urobilinogen, UA: 0.2 E.U./dL
pH, UA: 6.5 (ref 5.0–8.0)

## 2020-10-12 NOTE — Progress Notes (Signed)
ROB: Doing well.  Feels like the baby "dropped".  Having more pelvic pressure.  Denies contractions.  Daily fetal movement.  Patient taking iron and prenatal vitamins.  Cultures next visit.

## 2020-10-17 ENCOUNTER — Ambulatory Visit (INDEPENDENT_AMBULATORY_CARE_PROVIDER_SITE_OTHER): Payer: Self-pay | Admitting: Obstetrics and Gynecology

## 2020-10-17 ENCOUNTER — Other Ambulatory Visit: Payer: Self-pay

## 2020-10-17 ENCOUNTER — Encounter: Payer: Self-pay | Admitting: Obstetrics and Gynecology

## 2020-10-17 VITALS — BP 142/80 | HR 98 | Ht 70.0 in | Wt 308.7 lb

## 2020-10-17 DIAGNOSIS — O99210 Obesity complicating pregnancy, unspecified trimester: Secondary | ICD-10-CM | POA: Insufficient documentation

## 2020-10-17 DIAGNOSIS — O0993 Supervision of high risk pregnancy, unspecified, third trimester: Secondary | ICD-10-CM

## 2020-10-17 DIAGNOSIS — Z3A36 36 weeks gestation of pregnancy: Secondary | ICD-10-CM

## 2020-10-17 LAB — POCT URINALYSIS DIPSTICK OB
Bilirubin, UA: NEGATIVE
Blood, UA: NEGATIVE
Glucose, UA: NEGATIVE
Ketones, UA: NEGATIVE
Leukocytes, UA: NEGATIVE
Nitrite, UA: NEGATIVE
POC,PROTEIN,UA: NEGATIVE
Spec Grav, UA: 1.01 (ref 1.010–1.025)
Urobilinogen, UA: 0.2 E.U./dL
pH, UA: 7.5 (ref 5.0–8.0)

## 2020-10-17 NOTE — Progress Notes (Signed)
ROB-Pt present for routine prenatal care. Pt c/o lower abd/pelvic pain and braxton hick contractions.

## 2020-10-17 NOTE — Patient Instructions (Addendum)
[facility], [place], [place] [address]  Phone: [phone]   [facility] (second location)  [address] [place], [place] [address]  Phone: [phone]   [facility] [place], [place], [place] [address] Phone: [phone]   [facility]  [address] [place], [place] [address]  Phone: [phone]       Nonstress Test A nonstress test is a procedure that is done during pregnancy in order to check the baby's heartbeat. The procedure can help to show if the baby (fetus) is healthy. It is commonly done when:  The baby is past his or her due date.  The pregnancy is high risk.  The baby is moving less than normal.  The mother has lost a pregnancy in the past.  The health care provider suspects a problem with the baby's growth.  There is too much or too little amniotic fluid. The procedure is often done in the third trimester of pregnancy to find out if an early delivery is needed and whether such a delivery is safe. During a nonstress test, the baby's heartbeat is monitored when the baby is resting and when the baby is moving. If the baby is healthy, the heart rate will increase when he or she moves or kicks and will return to normal when he or she rests. Tell a health care provider about:  Any allergies you have.  Any medical conditions you have.  All medicines you are taking, including vitamins, herbs, eye drops, creams, and over-the-counter medicines.  Any surgeries you have had.  Any past pregnancies you have had. What are the risks? There are no risks to you or your baby from a nonstress test. This procedure should not be painful or uncomfortable. What happens before the procedure?  Eat a meal right before the test or as directed by your health care provider. Food may help encourage the baby to move.  Use the restroom right before the  test. What happens during the procedure?  Two monitors will be placed on your abdomen. One will record the baby's heart rate and the other will record the contractions of your uterus.  You may be asked to lie down on your side or to sit upright.  You may be given a button to press when you feel your baby move.  Your health care provider will listen to your baby's heartbeat and record it. He or she may also watch your baby's heartbeat on a screen.  If the baby seems to be sleeping, you may be asked to drink some juice or soda, eat a snack, or change positions. The procedure may vary among health care providers and hospitals.   What can I expect after procedure?  Your health care provider will discuss the test results with you and make recommendations for the future. Depending on the results, your health care provider may order additional tests or another course of action.  If your health care provider gave you any diet or activity instructions, make sure to follow them.  Keep all follow-up visits. This is important. Summary  A nonstress test is a procedure that is done during pregnancy in order to check the baby's heartbeat. The procedure can help show if the baby is healthy.  The procedure is often done in the third trimester of pregnancy to find out if an early delivery is needed and whether such a delivery is safe.  During  a nonstress test, the baby's heartbeat is monitored when the baby is resting and when the baby is moving. If the baby is healthy, the heart rate will increase when he or she moves or kicks and will return to normal when he or she rests.  Your health care provider will discuss the test results with you and make recommendations for the future. This information is not intended to replace advice given to you by your health care provider. Make sure you discuss any questions you have with your health care provider. Document Revised: 06/19/2020 Document Reviewed:  06/19/2020 Elsevier Patient Education  2021 [employer].

## 2020-10-17 NOTE — Progress Notes (Signed)
ROB: States she has created a birth plan. BP is mildly elevated today but repeat wnl. No proteinuria.  Patient due to begin antenatal testing weekly for increased BMI.  NST performed today was reviewed and was found to be reactive. 36 week labs done today. Answered all general questions regarding pregnancy. Continue recommended antenatal testing and prenatal care. Growth scan ordered for next week.    NONSTRESS TEST INTERPRETATION  INDICATIONS: Obesity  FHR baseline: 130 bpm RESULTS:Reactive COMMENTS: No contractions noted.    PLAN: 1. Continue fetal kick counts twice a day. 2. Continue antepartum testing as scheduled-weekly

## 2020-10-19 LAB — GC/CHLAMYDIA PROBE AMP
Chlamydia trachomatis, NAA: NEGATIVE
Neisseria Gonorrhoeae by PCR: NEGATIVE

## 2020-10-19 LAB — STREP GP B NAA: Strep Gp B NAA: NEGATIVE

## 2020-10-25 ENCOUNTER — Encounter: Payer: Self-pay | Admitting: Obstetrics and Gynecology

## 2020-10-25 ENCOUNTER — Other Ambulatory Visit (INDEPENDENT_AMBULATORY_CARE_PROVIDER_SITE_OTHER): Payer: Medicaid Other

## 2020-10-25 ENCOUNTER — Other Ambulatory Visit: Payer: Self-pay

## 2020-10-25 ENCOUNTER — Other Ambulatory Visit: Payer: Self-pay | Admitting: Obstetrics and Gynecology

## 2020-10-25 ENCOUNTER — Ambulatory Visit (INDEPENDENT_AMBULATORY_CARE_PROVIDER_SITE_OTHER): Payer: Self-pay | Admitting: Obstetrics and Gynecology

## 2020-10-25 VITALS — BP 131/63 | HR 97 | Wt 311.5 lb

## 2020-10-25 DIAGNOSIS — Z3689 Encounter for other specified antenatal screening: Secondary | ICD-10-CM | POA: Diagnosis not present

## 2020-10-25 DIAGNOSIS — Z3A37 37 weeks gestation of pregnancy: Secondary | ICD-10-CM

## 2020-10-25 DIAGNOSIS — O0993 Supervision of high risk pregnancy, unspecified, third trimester: Secondary | ICD-10-CM

## 2020-10-25 DIAGNOSIS — Z6841 Body Mass Index (BMI) 40.0 and over, adult: Secondary | ICD-10-CM

## 2020-10-25 LAB — POCT URINALYSIS DIPSTICK OB
Bilirubin, UA: NEGATIVE
Blood, UA: NEGATIVE
Glucose, UA: NEGATIVE
Ketones, UA: NEGATIVE
Leukocytes, UA: NEGATIVE
Nitrite, UA: NEGATIVE
POC,PROTEIN,UA: NEGATIVE
Spec Grav, UA: 1.01 (ref 1.010–1.025)
Urobilinogen, UA: 0.2 E.U./dL
pH, UA: 6.5 (ref 5.0–8.0)

## 2020-10-25 NOTE — Progress Notes (Signed)
ROB: Patient reports she is doing well.  Occasional contractions " more painful than [employer]" but not regular.  Signs and symptoms of labor discussed.  Patient scheduled for growth ultrasound and BPP today.  Discussed necessity of induction prior to 40 weeks.  Lab work from last visit discussed.  NST with next visit.

## 2020-11-01 ENCOUNTER — Other Ambulatory Visit: Payer: Self-pay

## 2020-11-01 ENCOUNTER — Ambulatory Visit (INDEPENDENT_AMBULATORY_CARE_PROVIDER_SITE_OTHER): Payer: Self-pay | Admitting: Obstetrics and Gynecology

## 2020-11-01 VITALS — BP 135/84 | HR 102 | Ht 70.0 in | Wt 311.2 lb

## 2020-11-01 DIAGNOSIS — O0993 Supervision of high risk pregnancy, unspecified, third trimester: Secondary | ICD-10-CM

## 2020-11-01 DIAGNOSIS — Z3A38 38 weeks gestation of pregnancy: Secondary | ICD-10-CM

## 2020-11-01 DIAGNOSIS — O163 Unspecified maternal hypertension, third trimester: Secondary | ICD-10-CM

## 2020-11-01 LAB — POCT URINALYSIS DIPSTICK OB
Bilirubin, UA: NEGATIVE
Blood, UA: NEGATIVE
Glucose, UA: NEGATIVE
Ketones, UA: NEGATIVE
Leukocytes, UA: NEGATIVE
Nitrite, UA: NEGATIVE
POC,PROTEIN,UA: NEGATIVE
Spec Grav, UA: 1.02 (ref 1.010–1.025)
Urobilinogen, UA: 0.2 E.U./dL
pH, UA: 7 (ref 5.0–8.0)

## 2020-11-01 NOTE — Patient Instructions (Signed)
Hypertension During Pregnancy Hypertension is also called high blood pressure. High blood pressure means that the force of the blood moving in your body is high enough to cause problems for you and your baby. Different types of high blood pressure can happen during pregnancy. The types are:  High blood pressure before you got pregnant. This is called chronic hypertension.  This can continue during your pregnancy. Your doctor will want to keep checking your blood pressure. You may need medicine to control your blood pressure while you are pregnant. You will need follow-up visits after you have your baby.  High blood pressure that goes up during pregnancy when it was normal before. This is called gestational hypertension. It will often get better after you have your baby, but your doctor will need to watch your blood pressure to make sure that it is getting better.  You may develop high blood pressure after giving birth. This is called postpartum hypertension. This often occurs within 48 hours after childbirth but may occur up to 6 weeks after giving birth. Very high blood pressure during pregnancy is an emergency that needs treatment right away. How does this affect me? If you have high blood pressure during pregnancy, you have a higher chance of developing high blood pressure:  As you get older.  If you get pregnant again. In some cases, high blood pressure during pregnancy can cause:  Stroke.  Heart attack.  Damage to the kidneys, lungs, or liver.  Preeclampsia.  HELLP syndrome.  Seizures.  Problems with the placenta. How does this affect my baby? Your baby may:  Be born early.  Not weigh as much as he or she should.  Not handle labor well, leading to a C-section. This condition may also result in a baby's death before birth (stillbirth). What are the risks?  Having high blood pressure during a past pregnancy.  Being overweight.  Being age 35 or older.  Being pregnant  for the first time.  Being pregnant with more than one baby.  Becoming pregnant using fertility methods, such as IVF.  Having other problems, such as diabetes or kidney disease. What can I do to lower my risk?  Keep a healthy weight.  Eat a healthy diet.  Follow what your doctor tells you about treating any medical problems that you had before you got pregnant. It is very important to go to all of your doctor visits. Your doctor will check your blood pressure and make sure that your pregnancy is progressing as it should. Treatment should start early if a problem is found.   How is this treated? Treatment for high blood pressure during pregnancy can vary. It depends on the type of high blood pressure you have and how serious it is.  If you were taking medicine for your blood pressure before you got pregnant, talk with your doctor. You may need to change the medicine during pregnancy if it is not safe for your baby.  If your blood pressure goes up during pregnancy, your doctor may order medicine to treat this.  If you are at risk for preeclampsia, your doctor may tell you to take a low-dose aspirin while you are pregnant.  If you have very high blood pressure, you may need to stay in the hospital so you and your baby can be watched closely. You may also need to take medicine to lower your blood pressure.  In some cases, if your condition gets worse, you may need to have your baby early.   Follow these instructions at home: Eating and drinking  Drink enough fluid to keep your pee (urine) pale yellow.  Avoid caffeine.   Lifestyle  Do not smoke or use any products that contain nicotine or tobacco. If you need help quitting, ask your doctor.  Do not use alcohol or drugs.  Avoid stress.  Rest and get plenty of sleep.  Regular exercise can help. Ask your doctor what kinds of exercise are best for you. General instructions  Take over-the-counter and prescription medicines only as  told by your doctor.  Keep all prenatal and follow-up visits. Contact a doctor if:  You have symptoms that your doctor told you to watch for, such as: ? Headaches. ? A feeling like you may vomit (nausea). ? Vomiting. ? Belly (abdominal) pain. ? Feeling dizzy or light-headed. Get help right away if:  You have symptoms of serious problems, such as: ? Very bad belly pain that does not get better with treatment. ? A very bad headache that does not get better. ? Blurry vision. ? Double vision. ? Vomiting that does not get better. ? Sudden, fast weight gain. ? Sudden swelling in your hands, ankles, or face. ? Bleeding from your vagina. ? Blood in your pee. ? Shortness of breath. ? Chest pain. ? Weakness on one side of your body. ? Trouble talking.  Your baby is not moving as much as usual. These symptoms may be an emergency. Get help right away. Call your local emergency services (911 in the [place]  Do not wait to see if the symptoms will go away.  Do not drive yourself to the hospital. Summary  High blood pressure is also called hypertension.  High blood pressure means that the force of the blood moving in your body is high enough to cause problems for you and your baby.  Get help right away if you have symptoms of serious problems due to high blood pressure.  Keep all prenatal and follow-up visits. This information is not intended to replace advice given to you by your health care provider. Make sure you discuss any questions you have with your health care provider. Document Revised: 06/01/2020 Document Reviewed: 06/01/2020 Elsevier Patient Education  2021 [employer]vier Inc.  

## 2020-11-01 NOTE — Addendum Note (Signed)
Addended by: Augusto Gamble on: 11/01/2020 10:48 AM   Modules accepted: Orders

## 2020-11-01 NOTE — Progress Notes (Signed)
ROB: Patient noting some pelvic pressure and pain.  Otherwise doing well. BPs mildly elevated again this visit. Will perform [place] labs.  Discussion had on IOL in 39th week for BMI (and now possibly BPs0. She desires to get as close to her due date (3/22) as possible.  Discussed that this may not be possible if BPs continue to be elevated or labs indicated pre-eclampsia.  RTC in 5 days for BP check. NST performed today was reviewed and was found to be reactive.  Continue recommended antenatal testing and prenatal care.   NONSTRESS TEST INTERPRETATION  INDICATIONS: Obesity  FHR baseline: 150 bpm RESULTS:Reactive COMMENTS: No contractions.    PLAN: 1. Continue fetal kick counts twice a day. 2. Continue antepartum testing as scheduled-weekly

## 2020-11-01 NOTE — Progress Notes (Signed)
ROB-Pt present for routine prenatal care and NST the patient c/o lower pelvic pain and upper abd pressure along with contractions every 1-2 hours.

## 2020-11-02 LAB — COMPREHENSIVE METABOLIC PANEL
ALT: 10 IU/L (ref 0–32)
AST: 8 IU/L (ref 0–40)
Albumin/Globulin Ratio: 1.5 (ref 1.2–2.2)
Albumin: 3.7 g/dL — ABNORMAL LOW (ref 3.9–5.0)
Alkaline Phosphatase: 114 IU/L (ref 44–121)
BUN/Creatinine Ratio: 13 (ref 9–23)
BUN: 9 mg/dL (ref 6–20)
Bilirubin Total: <0.2 mg/dL (ref 0.0–1.2)
CO2: 19 mmol/L — ABNORMAL LOW (ref 20–29)
Calcium: 9.5 mg/dL (ref 8.7–10.2)
Chloride: 103 mmol/L (ref 96–106)
Creatinine, Ser: 0.67 mg/dL (ref 0.57–1.00)
GFR calc Af Amer: 139 mL/min/{1.73_m2} (ref >59)
GFR calc non Af Amer: 121 mL/min/{1.73_m2} (ref >59)
Globulin, Total: 2.4 g/dL (ref 1.5–4.5)
Glucose: 101 mg/dL — ABNORMAL HIGH (ref 65–99)
Potassium: 4.3 mmol/L (ref 3.5–5.2)
Sodium: 138 mmol/L (ref 134–144)
Total Protein: 6.1 g/dL (ref 6.0–8.5)

## 2020-11-02 LAB — PROTEIN / CREATININE RATIO, URINE
Creatinine, Urine: 186.2 mg/dL
Protein, Ur: 28.2 mg/dL
Protein/Creat Ratio: 151 mg/g creat (ref 0–200)

## 2020-11-02 LAB — CBC
Hematocrit: 34.6 % (ref 34.0–46.6)
Hemoglobin: 11.6 g/dL (ref 11.1–15.9)
MCH: 28.4 pg (ref 26.6–33.0)
MCHC: 33.5 g/dL (ref 31.5–35.7)
MCV: 85 fL (ref 79–97)
Platelets: 218 10*3/uL (ref 150–450)
RBC: 4.09 x10E6/uL (ref 3.77–5.28)
RDW: 14.7 % (ref 11.7–15.4)
WBC: 8.7 10*3/uL (ref 3.4–10.8)

## 2020-11-06 ENCOUNTER — Encounter: Payer: Self-pay | Admitting: Obstetrics and Gynecology

## 2020-11-06 ENCOUNTER — Ambulatory Visit (INDEPENDENT_AMBULATORY_CARE_PROVIDER_SITE_OTHER): Payer: Medicaid Other | Admitting: Obstetrics and Gynecology

## 2020-11-06 ENCOUNTER — Other Ambulatory Visit: Payer: Self-pay

## 2020-11-06 VITALS — BP 124/84 | HR 93 | Ht 70.0 in | Wt 306.2 lb

## 2020-11-06 DIAGNOSIS — O163 Unspecified maternal hypertension, third trimester: Secondary | ICD-10-CM | POA: Diagnosis not present

## 2020-11-06 NOTE — Progress Notes (Signed)
  Miranda Villegas presents for BP check.  She is 38w 6d gestational age. Pt denies any signs or symptoms of elevated bp.  BP today is 124/84 P 93. Less than 140/90. Pt voiced understanding. Follow up for next ob visit this week on 11/08/2020 with DJE.

## 2020-11-08 ENCOUNTER — Other Ambulatory Visit: Payer: Self-pay

## 2020-11-08 ENCOUNTER — Ambulatory Visit (INDEPENDENT_AMBULATORY_CARE_PROVIDER_SITE_OTHER): Payer: Medicaid Other | Admitting: Obstetrics and Gynecology

## 2020-11-08 VITALS — BP 141/78 | HR 94 | Wt 305.0 lb

## 2020-11-08 DIAGNOSIS — Z3A39 39 weeks gestation of pregnancy: Secondary | ICD-10-CM | POA: Diagnosis not present

## 2020-11-08 LAB — POCT URINALYSIS DIPSTICK OB
Bilirubin, UA: 1
Blood, UA: NEGATIVE
Glucose, UA: NEGATIVE
Ketones, UA: NEGATIVE
Nitrite, UA: NEGATIVE
Odor: NEGATIVE
Spec Grav, UA: 1.02 (ref 1.010–1.025)
Urobilinogen, UA: 0.2 E.U./dL
pH, UA: 6 (ref 5.0–8.0)

## 2020-11-08 NOTE — Progress Notes (Signed)
ROB- c/o diarrhea.

## 2020-11-08 NOTE — Progress Notes (Signed)
ROB: Preeclampsia labs from last week-normal.  BPs about the same.  Cervix remains unfavorable.  Patient wants to get as close to her due date as possible before being induced.  Induction scheduled for 2/21.  Cytotec induction discussed.  NST today reactive.

## 2020-11-13 ENCOUNTER — Inpatient Hospital Stay
Admission: RE | Admit: 2020-11-13 | Discharge: 2020-11-16 | DRG: 807 | Disposition: A | Discharge status: Home / Self Care | Payer: Medicaid Other | Attending: Obstetrics and Gynecology | Admitting: Obstetrics and Gynecology

## 2020-11-13 ENCOUNTER — Encounter: Payer: Self-pay | Admitting: Obstetrics and Gynecology

## 2020-11-13 ENCOUNTER — Other Ambulatory Visit: Payer: Self-pay

## 2020-11-13 ENCOUNTER — Inpatient Hospital Stay: Payer: Medicaid Other | Admitting: Certified Registered Nurse Anesthetist

## 2020-11-13 DIAGNOSIS — Z3490 Encounter for supervision of normal pregnancy, unspecified, unspecified trimester: Secondary | ICD-10-CM

## 2020-11-13 DIAGNOSIS — D509 Iron deficiency anemia, unspecified: Secondary | ICD-10-CM

## 2020-11-13 DIAGNOSIS — O99214 Obesity complicating childbirth: Secondary | ICD-10-CM | POA: Diagnosis not present

## 2020-11-13 DIAGNOSIS — Z3A39 39 weeks gestation of pregnancy: Secondary | ICD-10-CM

## 2020-11-13 DIAGNOSIS — D649 Anemia, unspecified: Secondary | ICD-10-CM | POA: Diagnosis present

## 2020-11-13 DIAGNOSIS — O99210 Obesity complicating pregnancy, unspecified trimester: Secondary | ICD-10-CM | POA: Diagnosis present

## 2020-11-13 DIAGNOSIS — Z8616 Personal history of COVID-19: Secondary | ICD-10-CM | POA: Diagnosis not present

## 2020-11-13 DIAGNOSIS — O9902 Anemia complicating childbirth: Secondary | ICD-10-CM | POA: Diagnosis present

## 2020-11-13 DIAGNOSIS — F32A Depression, unspecified: Secondary | ICD-10-CM | POA: Diagnosis not present

## 2020-11-13 DIAGNOSIS — E669 Obesity, unspecified: Secondary | ICD-10-CM | POA: Diagnosis not present

## 2020-11-13 DIAGNOSIS — Z8571 Personal history of Hodgkin lymphoma: Secondary | ICD-10-CM

## 2020-11-13 DIAGNOSIS — F419 Anxiety disorder, unspecified: Secondary | ICD-10-CM

## 2020-11-13 LAB — CBC
HCT: 35.2 % — ABNORMAL LOW (ref 36.0–46.0)
Hemoglobin: 11.3 g/dL — ABNORMAL LOW (ref 12.0–15.0)
MCH: 27.4 pg (ref 26.0–34.0)
MCHC: 32.1 g/dL (ref 30.0–36.0)
MCV: 85.2 fL (ref 80.0–100.0)
Platelets: 237 10*3/uL (ref 150–400)
RBC: 4.13 MIL/uL (ref 3.87–5.11)
RDW: 14.6 % (ref 11.5–15.5)
WBC: 7.5 10*3/uL (ref 4.0–10.5)
nRBC: 0 % (ref 0.0–0.2)

## 2020-11-13 LAB — RPR: RPR Ser Ql: NONREACTIVE

## 2020-11-13 MED ORDER — OXYTOCIN-SODIUM CHLORIDE 30-0.9 UT/500ML-% IV SOLN
2.5000 [IU]/h | INTRAVENOUS | Status: DC
  Administered 2020-11-14: 2.5 [IU]/h via INTRAVENOUS
  Filled 2020-11-13: qty 500

## 2020-11-13 MED ORDER — OXYTOCIN BOLUS FROM INFUSION
333.0000 mL | Freq: Once | INTRAVENOUS | Status: DC
  Administered 2020-11-14: 333 mL via INTRAVENOUS

## 2020-11-13 MED ORDER — FENTANYL 2.5 MCG/ML W/ROPIVACAINE 0.15% IN NS 100 ML EPIDURAL (ARMC)
EPIDURAL | Status: DC | PRN
  Administered 2020-11-13: 12 mL/h via EPIDURAL

## 2020-11-13 MED ORDER — LACTATED RINGERS IV SOLN
500.0000 mL | INTRAVENOUS | Status: DC | PRN
  Administered 2020-11-13: 500 mL via INTRAVENOUS
  Administered 2020-11-13: 1000 mL via INTRAVENOUS

## 2020-11-13 MED ORDER — SOD CITRATE-CITRIC ACID 500-334 MG/5ML PO SOLN: 30.0000 mL | ORAL | Status: DC | PRN

## 2020-11-13 MED ORDER — OXYTOCIN 10 UNIT/ML IJ SOLN
INTRAMUSCULAR | Status: AC
  Filled 2020-11-13: qty 2

## 2020-11-13 MED ORDER — MISOPROSTOL 200 MCG PO TABS
ORAL_TABLET | ORAL | Status: AC
  Administered 2020-11-13: 50 ug via ORAL
  Filled 2020-11-13: qty 4

## 2020-11-13 MED ORDER — AMMONIA AROMATIC IN INHA
RESPIRATORY_TRACT | Status: AC
  Filled 2020-11-13: qty 10

## 2020-11-13 MED ORDER — MISOPROSTOL 50MCG HALF TABLET
50.0000 ug | ORAL_TABLET | ORAL | Status: DC | PRN
  Administered 2020-11-13 (×2): 50 ug via VAGINAL
  Filled 2020-11-13 (×3): qty 1

## 2020-11-13 MED ORDER — SODIUM CHLORIDE 0.9 % IV SOLN
1.0000 g | INTRAVENOUS | Status: DC
  Filled 2020-11-13: qty 1000

## 2020-11-13 MED ORDER — BUTORPHANOL TARTRATE 1 MG/ML IJ SOLN: 1.0000 mg | INTRAMUSCULAR | Status: DC | PRN

## 2020-11-13 MED ORDER — FENTANYL 2.5 MCG/ML W/ROPIVACAINE 0.15% IN NS 100 ML EPIDURAL (ARMC)
EPIDURAL | Status: AC
  Filled 2020-11-13: qty 100

## 2020-11-13 MED ORDER — LIDOCAINE HCL (PF) 1 % IJ SOLN
INTRAMUSCULAR | Status: DC | PRN
  Administered 2020-11-13: 3 mL

## 2020-11-13 MED ORDER — LACTATED RINGERS AMNIOINFUSION
INTRAVENOUS | Status: DC
  Filled 2020-11-13: qty 1000

## 2020-11-13 MED ORDER — OXYTOCIN-SODIUM CHLORIDE 30-0.9 UT/500ML-% IV SOLN
1.0000 m[IU]/min | INTRAVENOUS | Status: DC
  Administered 2020-11-14: 1 m[IU]/min via INTRAVENOUS

## 2020-11-13 MED ORDER — LIDOCAINE-EPINEPHRINE (PF) 1.5 %-1:200000 IJ SOLN: INTRAMUSCULAR | Status: DC | PRN

## 2020-11-13 MED ORDER — LIDOCAINE HCL (PF) 1 % IJ SOLN
30.0000 mL | INTRAMUSCULAR | Status: DC | PRN
  Filled 2020-11-13: qty 30

## 2020-11-13 MED ORDER — ONDANSETRON HCL 4 MG/2ML IJ SOLN: 4.0000 mg | Freq: Four times a day (QID) | INTRAMUSCULAR | Status: DC | PRN

## 2020-11-13 MED ORDER — MISOPROSTOL 50MCG HALF TABLET: 50.0000 ug | ORAL_TABLET | Freq: Once | ORAL | Status: AC

## 2020-11-13 MED ORDER — ACETAMINOPHEN 325 MG PO TABS: 650.0000 mg | ORAL_TABLET | ORAL | Status: DC | PRN

## 2020-11-13 MED ORDER — LIDOCAINE-EPINEPHRINE (PF) 1.5 %-1:200000 IJ SOLN
INTRAMUSCULAR | Status: DC | PRN
  Administered 2020-11-13: 3 mL via PERINEURAL

## 2020-11-13 MED ORDER — SODIUM CHLORIDE 0.9 % IV SOLN: 2.0000 g | Freq: Once | INTRAVENOUS | Status: DC

## 2020-11-13 MED ORDER — BUPIVACAINE HCL (PF) 0.25 % IJ SOLN
INTRAMUSCULAR | Status: DC | PRN
  Administered 2020-11-13: 5 mL via EPIDURAL
  Administered 2020-11-13: 3 mL via EPIDURAL

## 2020-11-13 MED ORDER — LACTATED RINGERS IV SOLN: INTRAVENOUS | Status: DC

## 2020-11-13 NOTE — Progress Notes (Signed)
Intrapartum Progress Note  S: Doing well, is s/p epidural placement. Denies complaints  O: Blood pressure 135/69, pulse 99, temperature 98.3 F (36.8 C), temperature source Oral, resp. rate 18, height 5\' 10"  (1.778 m), weight (!) 138.3 kg, last menstrual period 02/08/2020, SpO2 99 %. Gen App: NAD, mildly uncomfortable Abdomen: soft, gravid FHT: baseline 140 bpm.  Accels present.  Decels present - variable decelerations, shallow (for majority). 1 possible late deceleration Tocometer: contractions q 3-4 minutes Cervix: deferred. Foley bulb still in place, on tension Extremities: Nontender, no edema.  Pitocin: none  Labs:  No new labs  Assessment:  1: SIUP at [redacted]w[redacted]d 2. Obesity in pregnancy (BMI 43) 3. H/o Hodgkin's lymphoma 4. H/o depression/anxiety  Plan:  1. Continue IOL. Is now on 3rd dose of Cytotec. Cook's catheter in place.  Unintentional AROM at last check.  2. Will proceed with augmentation with Pitocin once Foley bulb expelled.  3. Category II tracing.  Improved some with position change (currently supine with elevation of upper torso). Has received IVF bolus.    Rubie Maid, MD 11/13/2020 5:47 PM

## 2020-11-13 NOTE — Progress Notes (Addendum)
Intrapartum Progress Note  S: Denies complaints.   O: Blood pressure 135/69, pulse 99, temperature 98.9 F (37.2 C), temperature source Oral, resp. rate 18, height 5\' 10"  (1.778 m), weight (!) 138.3 kg, last menstrual period 02/08/2020, SpO2 97 %. Gen App: NAD, mildly uncomfortable Abdomen: soft, gravid FHT: baseline 155 bpm.  Accels present.  Decels present - recurrent variable decelerations, from baseline to 120s. Difficulty tracing fetus on monitor continously.  Tocometer: contractions q 3-4 minutes Cervix: Foley bulb still in place, on tension.  Removed, exam 4/70/-2 Extremities: Nontender, no edema.  Pitocin: none  Labs:  No new labs  Assessment:  1: SIUP at [redacted]w[redacted]d 2. Obesity in pregnancy (BMI 43) 3. H/o Hodgkin's lymphoma 4. H/o depression/anxiety  Plan:  1. Category II tracing.  Foley bulb removed so IUPC and FSE could be placed.  Will begin amnioinfusion.  2.  Once fetal status more reassuring, will proceed with augmentation with Pitocin.    Rubie Maid, MD 11/13/2020 9:08 PM

## 2020-11-13 NOTE — Progress Notes (Signed)
Intrapartum Progress Note  S: Is feeling contractions, feels crampy.  O: Blood pressure (!) 120/55, pulse (!) 102, temperature 98.4 F (36.9 C), temperature source Oral, resp. rate 18, height 5\' 10"  (1.778 m), weight (!) 138.3 kg, last menstrual period 02/08/2020, SpO2 97 %. Gen App: NAD, mildly uncomfortable Abdomen: soft, gravid FHT: baseline 150 bpm.  Accels present.  Decels present - occasional shallow variable present. Previously recurrent prior to amnioinfusion Tocometer: contractions q 3-4 minutes Cervix: 5/80/0/AROM'd Extremities: Nontender, no edema.  Pitocin: none  Labs:  No new labs  Assessment:  1: SIUP at [redacted]w[redacted]d 2. Obesity in pregnancy (BMI 43) 3. H/o Hodgkin's lymphoma 4. H/o depression/anxiety  Plan:  1. Overall reassuring tracing now. Continue amnioinfusion  2.  Will proceed with augmentation with Pitocin, low dose regimen. 3. Monitor for s/s of chorioamnionitis.    Rubie Maid, MD 11/13/2020 11:22 PM

## 2020-11-13 NOTE — H&P (Signed)
Obstetric History and Physical  Miranda Villegas is a 30 y.o. G1P0 with IUP at [redacted]w[redacted]d presenting for scheduled IOL for morbid obesity in pregnancy. Also with labile BPs at end of pregnancy. Patient states she has been having  irregular contractions, no vaginal bleeding, intact membranes, with active fetal movement.    Prenatal Course Source of Care: [facility] with onset of care at 14 weeks Pregnancy complications or risks: Patient Active Problem List   Diagnosis Date Noted  . Term pregnancy 11/13/2020  . Obesity in pregnancy 10/17/2020  . COVID-19 affecting pregnancy in third trimester 09/28/2020  . Abdominal pain, epigastric 03/04/2018  . Gastroesophageal reflux disease 02/10/2018  . Quality of life palliative care encounter 02/06/2016  . History of Hodgkin's lymphoma 08/12/2013   She plans to breastfeed She desires undecided method for postpartum contraception.   Prenatal labs and studies: ABO, Rh: --/--/O POS Performed at [facility], [address] [place], [place] [address]  [phone]) Antibody: NEG (02/21 0029) Rubella: <0.90 (08/26 1416) RPR: Non Reactive (12/02 1103)  HBsAg: Negative (08/26 1416)  HIV: Non Reactive (08/26 1416)  DGU:YQIHKVQQ/-- (01/25 1635) 1 hr Glucola normal Genetic screening normal Anatomy US normal  Past Medical History:  Diagnosis Date  . Anxiety   . COVID-19   . Depression    as a 30 year old  . Fungal skin infection 09/01/2014  . hodgkins lymphoma 08/12/13   lymphoma recently dx  . Mass in neck    right side     Past Surgical History:  Procedure Laterality Date  . LYMPH NODE BIOPSY  08/09/13   medial neck area - lymphoma dx.  . MEDIASTINOSCOPY N/A 08/09/2013   Procedure: MEDIASTINOSCOPY;  Surgeon: Melrose Nakayama, MD;  Location: [facility] OR;  Service: Thoracic;  Laterality: N/A;  . TOOTH EXTRACTION      OB History  Gravida Para Term Preterm AB Living  1            SAB IAB Ectopic Multiple Live  Births               # Outcome Date GA Lbr Len/2nd Weight Sex Delivery Anes PTL Lv  1 Current             Social History   Socioeconomic History  . Marital status: Married    Spouse name: Not on file  . Number of children: Not on file  . Years of education: Not on file  . Highest education level: Not on file  Occupational History  . Not on file  Tobacco Use  . Smoking status: Never Smoker  . Smokeless tobacco: Never Used  Vaping Use  . Vaping Use: Never used  Substance and Sexual Activity  . Alcohol use: No  . Drug use: No  . Sexual activity: Yes    Birth control/protection: None  Other Topics Concern  . Not on file  Social History Narrative  . Not on file   Social Determinants of Health   Financial Resource Strain: Not on file  Food Insecurity: Not on file  Transportation Needs: Not on file  Physical Activity: Not on file  Stress: Not on file  Social Connections: Not on file    Family History  Problem Relation Age of Onset  . Alzheimer's disease Other   . Osteoarthritis Mother   . Migraines Mother   . Heart disease Other   . Hearing loss Other   . Parkinsonism Other   . Breast cancer  Other   . Transient ischemic attack Other   . Autoimmune disease Other   . Hypertension Father   . Colon cancer Neg Hx   . Stomach cancer Neg Hx     Medications Prior to Admission  Medication Sig Dispense Refill Last Dose  . Ascorbic Acid (VITAMIN C PO) Take 125 mg by mouth. Take 2 tablets daily   11/12/2020 at Unknown time  . cholecalciferol (VITAMIN D) 1000 units tablet Take 2,000 Units by mouth daily.   11/12/2020 at Unknown time  . Ferrous Sulfate (IRON) 325 (65 Fe) MG TABS Take 325 mg by mouth daily. Take two tablets daily   11/12/2020 at Unknown time  . loratadine (CLARITIN) 10 MG tablet Take 10 mg by mouth daily.    Past Week at Unknown time  . Methylcellulose, Laxative, (CITRUCEL) 500 MG TABS Take 500 mg by mouth. Take 2 tablets daily   11/12/2020 at Unknown time  .  prenatal vitamin w/FE, FA (PRENATAL 1 + 1) 27-1 MG TABS tablet Take 1 tablet by mouth daily at 12 noon.   11/12/2020 at Unknown time    Allergies  Allergen Reactions  . Adhesive [Tape] Rash    Review of Systems: Negative except for what is mentioned in HPI.  Physical Exam: BP 134/88   Pulse 87   Temp 98 F (36.7 C) (Oral)   Resp 18   Ht 5\' 10"  (1.778 m)   Wt (!) 138.3 kg   LMP 02/08/2020   BMI 43.76 kg/m  CONSTITUTIONAL: Well-developed, well-nourished female in no acute distress.  HENT:  Normocephalic, atraumatic, External right and left ear normal. Oropharynx is clear and moist EYES: Conjunctivae and EOM are normal. Pupils are equal, round, and reactive to light. No scleral icterus.  NECK: Normal range of motion, supple, no masses SKIN: Skin is warm and dry. No rash noted. Not diaphoretic. No erythema. No pallor. NEUROLOGIC: Alert and oriented to person, place, and time. Normal reflexes, muscle tone coordination. No cranial nerve deficit noted. PSYCHIATRIC: Normal mood and affect. Normal behavior. Normal judgment and thought content. CARDIOVASCULAR: Normal heart rate noted, regular rhythm RESPIRATORY: Effort and breath sounds normal, no problems with respiration noted ABDOMEN: Soft, nontender, nondistended, gravid. MUSCULOSKELETAL: Normal range of motion. No edema and no tenderness. 2+ distal pulses.  Cervical Exam: Dilatation 1 cm   Effacement thick%   Station -3   Presentation: cephalic FHT:  Baseline rate 140 bpm   Variability moderate  Accelerations present   Decelerations none Contractions: Every 1-3 mins   Pertinent Labs/Studies:   Results for orders placed or performed during the hospital encounter of 11/13/20 (from the past 24 hour(s))  CBC     Status: Abnormal   Collection Time: 11/13/20 12:29 AM  Result Value Ref Range   WBC 7.5 4.0 - 10.5 K/uL   RBC 4.13 3.87 - 5.11 MIL/uL   Hemoglobin 11.3 (L) 12.0 - 15.0 g/dL   HCT 35.2 (L) 36.0 - 46.0 %   MCV 85.2 80.0 -  100.0 fL   MCH 27.4 26.0 - 34.0 pg   MCHC 32.1 30.0 - 36.0 g/dL   RDW 14.6 11.5 - 15.5 %   Platelets 237 150 - 400 K/uL   nRBC 0.0 0.0 - 0.2 %  Type and screen     Status: None   Collection Time: 11/13/20 12:29 AM  Result Value Ref Range   ABO/RH(D) O POS    Antibody Screen NEG    Sample Expiration  11/16/2020,2359 Performed at [facility], [address] [place], [place] [address]   ABO/Rh     Status: None   Collection Time: 11/13/20 12:37 AM  Result Value Ref Range   ABO/RH(D)      O POS Performed at [facility], [address] [place], [place] [address]     Assessment : Ricca Melgarejo is a 30 y.o. G1P0 at [redacted]w[redacted]d being admitted for induction of labor due to morbid obesity in pregnancy. H/o labile BPs at end of pregnancy, monitor for gestational HTN during labor. H/o Hodgkin's lymphoma, h/o COVID infection this pregnancy.  Plan: Labor: Induction as ordered as per protocol with Cytotec. Analgesia as needed. FWB: Reassuring fetal heart tracing.  GBS negative Delivery plan: Hopeful for vaginal delivery    Rubie Maid, MD [facility]

## 2020-11-13 NOTE — Anesthesia Procedure Notes (Signed)
Epidural Patient location during procedure: OB Start time: 11/13/2020 2:16 PM  Staffing Anesthesiologist: Tera Mater, MD Resident/CRNA: Aline Brochure, CRNA Performed: resident/CRNA   Preanesthetic Checklist Completed: patient identified, IV checked, site marked, risks and benefits discussed, surgical consent, monitors and equipment checked and pre-op evaluation  Epidural Patient position: sitting Prep: ChloraPrep Patient monitoring: heart rate, continuous pulse ox and blood pressure Approach: midline Location: L3-L4 Injection technique: LOR saline  Needle:  Needle type: Tuohy  Needle gauge: 17 G Needle length: 9 cm and 9 Needle insertion depth: 7 cm Catheter type: closed end flexible Catheter size: 19 Gauge Catheter at skin depth: 12 cm Test dose: negative and 1.5% lidocaine with Epi 1:200 K  Assessment Events: blood not aspirated, injection not painful, no injection resistance, no paresthesia and negative IV test  Additional Notes 1 attempt Pt. Evaluated and documentation done after procedure finished. Patient identified. Risks/Benefits/Options discussed with patient including but not limited to bleeding, infection, nerve damage, paralysis, failed block, incomplete pain control, headache, blood pressure changes, nausea, vomiting, reactions to medication both or allergic, itching and postpartum back pain. Confirmed with bedside nurse the patient's most recent platelet count. Confirmed with patient that they are not currently taking any anticoagulation, have any bleeding history or any family history of bleeding disorders. Patient expressed understanding and wished to proceed. All questions were answered. Sterile technique was used throughout the entire procedure. Please see nursing notes for vital signs. Test dose was given through epidural catheter and negative prior to continuing to dose epidural or start infusion. Warning signs of high block given to the patient  including shortness of breath, tingling/numbness in hands, complete motor block, or any concerning symptoms with instructions to call for help. Patient was given instructions on fall risk and not to get out of bed. All questions and concerns addressed with instructions to call with any issues or inadequate analgesia.   Patient tolerated the insertion well without immediate complications.Reason for block:procedure for pain

## 2020-11-13 NOTE — Progress Notes (Signed)
Intrapartum Progress Note  S: Patient reports feeling her contractions.   O: Blood pressure 134/88, pulse 87, temperature 98 F (36.7 C), temperature source Oral, resp. rate 18, height 5\' 10"  (1.778 m), weight (!) 138.3 kg, last menstrual period 02/08/2020. Gen App: NAD, mildly uncomfortable Abdomen: soft, gravid FHT: baseline 140 bpm.  Accels present.  Decels present x 1 with maternal ambulation to restroom. moderate in degree variability.   Tocometer: contractions q 1-3 minutes Cervix: 1.5/40-50%/-3 Extremities: Nontender, no edema.  Pitocin: none  Labs:  No new labs  Assessment:  1: SIUP at [redacted]w[redacted]d 2. Obesity in pregnancy (BMI 43) 3. H/o Hodgkin's lymphoma 4. H/o depression/anxiety  Plan:  1. Continue IOL. Is s/p 2 doses of Cytotec. Cook's catheter placed with unintentional AROM (clear fluid).  2. If no increase in contraction intensity or frequency in next 30 minutes, will continue with Cytotec.  3. Next check due when foley bulb expelled.    Rubie Maid, MD 11/13/2020 12:53 PM

## 2020-11-13 NOTE — Anesthesia Preprocedure Evaluation (Signed)
Anesthesia Evaluation  Patient identified by MRN, date of birth, ID band Patient awake    Reviewed: Allergy & Precautions, H&P , NPO status , Patient's Chart, lab work & pertinent test results  Airway Mallampati: II  TM Distance: >3 FB Neck ROM: full    Dental no notable dental hx.    Pulmonary neg pulmonary ROS,    Pulmonary exam normal        Cardiovascular negative cardio ROS Normal cardiovascular exam     Neuro/Psych PSYCHIATRIC DISORDERS Anxiety Depression negative neurological ROS     GI/Hepatic Neg liver ROS, GERD  ,  Endo/Other  negative endocrine ROS  Renal/GU negative Renal ROS     Musculoskeletal   Abdominal   Peds  Hematology negative hematology ROS (+)   Anesthesia Other Findings   Reproductive/Obstetrics (+) Pregnancy                             Anesthesia Physical Anesthesia Plan  ASA: II  Anesthesia Plan:    Post-op Pain Management:    Induction:   PONV Risk Score and Plan:   Airway Management Planned:   Additional Equipment:   Intra-op Plan:   Post-operative Plan:   Informed Consent: I have reviewed the patients History and Physical, chart, labs and discussed the procedure including the risks, benefits and alternatives for the proposed anesthesia with the patient or authorized representative who has indicated his/her understanding and acceptance.       Plan Discussed with: Anesthesiologist and CRNA  Anesthesia Plan Comments:         Anesthesia Quick Evaluation

## 2020-11-14 ENCOUNTER — Encounter: Payer: Self-pay | Admitting: Obstetrics and Gynecology

## 2020-11-14 DIAGNOSIS — E669 Obesity, unspecified: Secondary | ICD-10-CM

## 2020-11-14 DIAGNOSIS — O99214 Obesity complicating childbirth: Principal | ICD-10-CM

## 2020-11-14 DIAGNOSIS — Z3A39 39 weeks gestation of pregnancy: Secondary | ICD-10-CM

## 2020-11-14 LAB — CBC
HCT: 31.1 % — ABNORMAL LOW (ref 36.0–46.0)
Hemoglobin: 10.5 g/dL — ABNORMAL LOW (ref 12.0–15.0)
MCH: 28.7 pg (ref 26.0–34.0)
MCHC: 33.8 g/dL (ref 30.0–36.0)
MCV: 85 fL (ref 80.0–100.0)
Platelets: 193 10*3/uL (ref 150–400)
RBC: 3.66 MIL/uL — ABNORMAL LOW (ref 3.87–5.11)
RDW: 15 % (ref 11.5–15.5)
WBC: 13.8 10*3/uL — ABNORMAL HIGH (ref 4.0–10.5)
nRBC: 0 % (ref 0.0–0.2)

## 2020-11-14 LAB — TYPE AND SCREEN
ABO/RH(D): O POS
Antibody Screen: NEGATIVE

## 2020-11-14 LAB — ABO/RH: ABO/RH(D): O POS

## 2020-11-14 MED ORDER — ACETAMINOPHEN 325 MG PO TABS
650.0000 mg | ORAL_TABLET | ORAL | Status: DC | PRN
  Administered 2020-11-15: 650 mg via ORAL
  Filled 2020-11-14: qty 2

## 2020-11-14 MED ORDER — WITCH HAZEL-GLYCERIN EX PADS: 1.0000 "application " | MEDICATED_PAD | CUTANEOUS | Status: DC | PRN

## 2020-11-14 MED ORDER — ONDANSETRON HCL 4 MG/2ML IJ SOLN: 4.0000 mg | INTRAMUSCULAR | Status: DC | PRN

## 2020-11-14 MED ORDER — COCONUT OIL OIL
1.0000 "application " | TOPICAL_OIL | Status: DC | PRN
  Administered 2020-11-15: 1 via TOPICAL
  Filled 2020-11-14: qty 120

## 2020-11-14 MED ORDER — SENNOSIDES-DOCUSATE SODIUM 8.6-50 MG PO TABS
2.0000 | ORAL_TABLET | Freq: Every day | ORAL | Status: DC
  Administered 2020-11-15 – 2020-11-16 (×2): 2 via ORAL
  Filled 2020-11-14 (×2): qty 2

## 2020-11-14 MED ORDER — DIBUCAINE (PERIANAL) 1 % EX OINT: 1.0000 "application " | TOPICAL_OINTMENT | CUTANEOUS | Status: DC | PRN

## 2020-11-14 MED ORDER — IBUPROFEN 600 MG PO TABS
600.0000 mg | ORAL_TABLET | Freq: Four times a day (QID) | ORAL | Status: DC
  Administered 2020-11-14 – 2020-11-16 (×11): 600 mg via ORAL
  Filled 2020-11-14 (×11): qty 1

## 2020-11-14 MED ORDER — BENZOCAINE-MENTHOL 20-0.5 % EX AERO: 1.0000 "application " | INHALATION_SPRAY | CUTANEOUS | Status: DC | PRN

## 2020-11-14 MED ORDER — IBUPROFEN 600 MG PO TABS: 600.0000 mg | ORAL_TABLET | Freq: Four times a day (QID) | ORAL | Status: DC

## 2020-11-14 MED ORDER — DIPHENHYDRAMINE HCL 25 MG PO CAPS: 25.0000 mg | ORAL_CAPSULE | Freq: Four times a day (QID) | ORAL | Status: DC | PRN

## 2020-11-14 MED ORDER — ZOLPIDEM TARTRATE 5 MG PO TABS: 5.0000 mg | ORAL_TABLET | Freq: Every evening | ORAL | Status: DC | PRN

## 2020-11-14 MED ORDER — SIMETHICONE 80 MG PO CHEW: 80.0000 mg | CHEWABLE_TABLET | ORAL | Status: DC | PRN

## 2020-11-14 MED ORDER — PRENATAL MULTIVITAMIN CH
1.0000 | ORAL_TABLET | Freq: Every day | ORAL | Status: DC
  Administered 2020-11-14 – 2020-11-16 (×3): 1 via ORAL
  Filled 2020-11-14 (×3): qty 1

## 2020-11-14 MED ORDER — ONDANSETRON HCL 4 MG PO TABS
4.0000 mg | ORAL_TABLET | ORAL | Status: DC | PRN
  Filled 2020-11-14: qty 1

## 2020-11-14 NOTE — Anesthesia Postprocedure Evaluation (Signed)
Anesthesia Post Note  Patient: Miranda Villegas  Procedure(s) Performed: AN AD HOC LABOR EPIDURAL  Patient location during evaluation: Mother Baby Anesthesia Type: Epidural Level of consciousness: awake and alert Pain management: pain level controlled Vital Signs Assessment: post-procedure vital signs reviewed and stable Respiratory status: spontaneous breathing, nonlabored ventilation and respiratory function stable Cardiovascular status: stable Postop Assessment: no headache, no backache and epidural receding Anesthetic complications: no   No complications documented.   Last Vitals:  Vitals:   11/14/20 0615 11/14/20 0850  BP: 132/79 117/73  Pulse: (!) 104 92  Resp: 16 19  Temp: 37.3 C 36.8 C  SpO2: 97%     Last Pain:  Vitals:   11/14/20 0850  TempSrc: Oral  PainSc:                  Estill Batten

## 2020-11-14 NOTE — Lactation Note (Signed)
This note was copied from a baby's chart. Lactation Consultation Note  Patient Name: Miranda Villegas [id number] Date: 11/14/2020 Reason for consult: Initial assessment;1st time breastfeeding;Term Age:28 hours   Initial lactation visit. Mom is G1P1, SVD 9-10hrs ago. 1 feed post delivery, mom reports "good latch, strong pull". Baby has not had a void/stool yet. Baby sleeping soundly in mom's arms. LC told by RN there is concern for movement in Left side of baby's face; noticed upon morning assessment of infant when crying.  LC took baby from mom and unwrapped from blankets, baby did wake, did cry out. LC brings baby to mom in just diaper and attempts a latch, baby's arm appears limp by baby's side- report given to RN post assistance with feeding. Baby uninterested at the breast, no cues shown, remained asleep. LC did hand express a few drops, baby still not interested.  LC reviewed newborn stomach size, feeding patterns, early cues, skin to skin, hand expression and frequent attempts. LC put baby skin to skin on mom's chest, and returned 20 minutes later, baby still sleeping soundly, non-cuing, not interested. Reiterated skin to skin, hand expression, and frequent attempts.  Whiteboard updated with name/number; encouraged to call for attempts each time, and for assistance.  RN was updated post assistance, concern noted for no moving of the left arm when assisting with feed. RN reports notification of Pediatrician and Neo.  Maternal Data Has patient been taught Hand Expression?: Yes Does the patient have breastfeeding experience prior to this delivery?: No  Feeding Mother's Current Feeding Choice: Breast Milk  LATCH Score Latch: Too sleepy or reluctant, no latch achieved, no sucking elicited. (sleepy)                  Lactation Tools Discussed/Used    Interventions Interventions: Breast feeding basics reviewed;Assisted with latch;Hand express;Support pillows;Adjust position  (sleepy; no latch achieved)  Discharge    Consult Status Consult Status: Follow-up Date: 11/14/20 Follow-up type: In-patient    Lavonia Drafts 11/14/2020, 12:56 PM

## 2020-11-14 NOTE — Lactation Note (Signed)
This note was copied from a baby's chart. Lactation Consultation Note  Patient Name: Miranda Villegas Date: 11/14/2020 Reason for consult: Follow-up assessment;Mother's request;1st time breastfeeding;Term Age:28 hours   LC and [place] student back in room to assist with feeding. Baby alert and cuing in bassinet. LC assisted with pillow placement, bringing baby to mom, educating on position/alignment. After a few attempts LC was able to sandwich and shape breast for a successful latch. Baby grasped the breast easily, had strong rhythmic sucking pattern, and audible swallows. Parents provided with tips and importance for keeping baby alert and awake throughout feeding for adequate intake. Reviewed signs that baby has had enough, and tracking of wet/stool diapers. Baby came off breast after 10 minutes of active feeding, round nipple with nipple more everted after feeding, and baby relaxed and sleeping. LC and [place] student assisted with bringing baby to chest and covering with blankets.  Encouraged frequent feeding attempts of 8-12 in first 24 hrs, hand expression reiterated/taught and encouraged between feedings if baby is sleepy. Encouraged to call for assistance/support as needed.  Maternal Data Has patient been taught Hand Expression?: Yes Does the patient have breastfeeding experience prior to this delivery?: No  Feeding Mother's Current Feeding Choice: Breast Milk  LATCH Score Latch: Grasps breast easily, tongue down, lips flanged, rhythmical sucking.  Audible Swallowing: A few with stimulation  Type of Nipple: Everted at rest and after stimulation  Comfort (Breast/Nipple): Soft / non-tender  Hold (Positioning): Assistance needed to correctly position infant at breast and maintain latch.  LATCH Score: 8   Lactation Tools Discussed/Used    Interventions Interventions: Breast feeding basics reviewed;Assisted with latch;Hand express;Breast compression;Adjust position;Support  pillows;Position options;Education  Discharge    Consult Status Consult Status: Follow-up Date: 11/14/20 Follow-up type: In-patient    Lavonia Drafts 11/14/2020, 1:54 PM

## 2020-11-15 NOTE — Lactation Note (Signed)
This note was copied from a baby's chart. Lactation Consultation Note  Patient Name: Miranda Villegas Date: 11/15/2020 Reason for consult: Follow-up assessment;1st time breastfeeding;Term Age:28 hours  Lactation follow-up. Baby sleeping in bassinet <2hr since last feeding. Mom has been independent with feedings, and feels that she is gaining confidence in getting baby to the breast, when to take a break, when to do skin to skin and re-try. Mom is able to hand express colostrum on her own, and demonstrated this for LC. Baby has had numerous output of voids and stools, no nipple damage or discomfort to note, and weight loss of 0.05% all to indicate that feeding has improved and baby is transferring well.  LC provided BF education for day 2, and output expectations. Encouraged 8-12 feedings throughout day 2, skin to skin, and continued hand expression to help encourage onset of adequate milk supply.  Reviewed milk supply and demand and normal course of lactation.   Encouraged to call out with questions or for ongoing BF support.  Maternal Data Has patient been taught Hand Expression?: Yes Does the patient have breastfeeding experience prior to this delivery?: No  Feeding Mother's Current Feeding Choice: Breast Milk  LATCH Score                    Lactation Tools Discussed/Used    Interventions Interventions: Breast feeding basics reviewed;Education;Hand express  Discharge    Consult Status Consult Status: PRN Date: 11/15/20 Follow-up type: Call as needed    Miranda Villegas 11/15/2020, 12:19 PM

## 2020-11-15 NOTE — Lactation Note (Signed)
This note was copied from a baby's chart. Lactation Consultation Note  Patient Name: Miranda Villegas [id number] Date: 11/15/2020   Age:28 hours  Lactation check-in. All of family sleeping at this time.  Lavonia Drafts 11/15/2020, 10:21 AM

## 2020-11-15 NOTE — Lactation Note (Signed)
This note was copied from a baby's chart. Lactation Consultation Note  Patient Name: Miranda Villegas [id number] Date: 11/15/2020 Reason for consult: Follow-up assessment;1st time breastfeeding;Term Age:28 hours  Lactation follow-up.  Baby had just completed bath by RN students and is skin to skin with mom. Showing some light hunger cues, but not eager. Encouraged to attempt to feed- mom independent in football position placement, multiple attempts made to latch; baby pushing away angry. Attempted skin to skin, continued to be upset. LC did hold baby briefly to calm her down, given back to mom where she settled nicely.  [place] educated parents on soothing techniques, signs of not wanting to feed- pushing away. Encouraged 8-12 feedings in 24 hours, alerted of potential for cluster feeding overnight tonight, and importance of putting to breast with all cues. Encouraged hand expression to entice a latch, and pre/post/between feedings as needed. Mom feeling no discomfort with latching/feeding, has not needed or used coconut oil. Encouraged to call for support as needed.  Maternal Data Has patient been taught Hand Expression?: Yes Does the patient have breastfeeding experience prior to this delivery?: No  Feeding Mother's Current Feeding Choice: Breast Milk  LATCH Score Latch: Too sleepy or reluctant, no latch achieved, no sucking elicited.                  Lactation Tools Discussed/Used    Interventions Interventions: Breast feeding basics reviewed;Skin to skin;Hand express;Education  Discharge    Consult Status Consult Status: PRN Date: 11/15/20 Follow-up type: Call as needed    Lavonia Drafts 11/15/2020, 3:51 PM

## 2020-11-15 NOTE — Progress Notes (Signed)
Post Partum Day # 1, s/p SVD  Subjective: no complaints, up ad lib, voiding and tolerating PO  Objective: Temp:  [97.8 F (36.6 C)-98.4 F (36.9 C)] 98.4 F (36.9 C) (02/23 0741) Pulse Rate:  [89-103] 94 (02/23 0741) Resp:  [18-20] 18 (02/23 0741) BP: (117-137)/(73-87) 127/73 (02/23 0741) SpO2:  [95 %-96 %] 96 % (02/23 0741)  Physical Exam:  General: alert and no distress  Lungs: clear to auscultation bilaterally Breasts: normal appearance, no masses or tenderness Heart: regular rate and rhythm, S1, S2 normal, no murmur, click, rub or gallop Abdomen: soft, non-tender; bowel sounds normal; no masses,  no organomegaly Pelvis: Lochia: appropriate, Uterine Fundus: firm Extremities: DVT Evaluation: No evidence of DVT seen on physical exam. Negative Homan's sign. No cords or calf tenderness. No significant calf/ankle edema.  Recent Labs    11/13/20 0029 11/14/20 1254  HGB 11.3* 10.5*  HCT 35.2* 31.1*    Assessment/Plan: Doing well postpartum Breastfeeding, s/p Lactation consult  Contraception OCPs Mild anemia of pregnancy, asymptomatic. Plan for discharge tomorrow   LOS: 2 days   Rubie Maid, MD [facility] 11/15/2020 8:01 AM

## 2020-11-16 LAB — URINALYSIS, COMPLETE (UACMP) WITH MICROSCOPIC
Bacteria, UA: NONE SEEN
Bilirubin Urine: NEGATIVE
Glucose, UA: NEGATIVE mg/dL
Ketones, ur: NEGATIVE mg/dL
Nitrite: NEGATIVE
Protein, ur: 30 mg/dL — AB
RBC / HPF: >50 RBC/hpf — ABNORMAL HIGH (ref 0–5)
Specific Gravity, Urine: 1.028 (ref 1.005–1.030)
pH: 5 (ref 5.0–8.0)

## 2020-11-16 MED ORDER — NITROFURANTOIN MONOHYD MACRO 100 MG PO CAPS: 100.0000 mg | ORAL_CAPSULE | Freq: Two times a day (BID) | ORAL | 0 refills | Status: AC

## 2020-11-16 MED ORDER — IBUPROFEN 600 MG PO TABS: 600.0000 mg | ORAL_TABLET | Freq: Four times a day (QID) | ORAL | 1 refills | Status: DC | PRN

## 2020-11-16 NOTE — Lactation Note (Signed)
This note was copied from a baby's chart. Lactation Consultation Note  Patient Name: Miranda Villegas [id number] Date: 11/16/2020 Reason for consult: Follow-up assessment;1st time breastfeeding;Term;Hyperbilirubinemia Age:28 hours  Lactation follow-up. Parents began supplementation post BF yesterday evening due to elevated bilirubin levels; high levels.  Mom reports baby accepting up to 5-15mL post feeds, but then drank a full 52mL via bottle this morning at 0730.  Baby started under phototherapy lights to decrease bilirubin levels.  Feeding plan today: Feed baby with cues, minimum of every 3hrs, increasing volume based on baby's DOL; aiming for 20-3mL. Mom will utilize DEBP in room to stimulate breasts a minimum of every 3 hours. Stressed importance of maximizing baby's time under the lights, feeds per MD direction, breast stimulation, and rest for parents as possible.  Maternal Data Has patient been taught Hand Expression?: Yes Does the patient have breastfeeding experience prior to this delivery?: No  Feeding Mother's Current Feeding Choice: Breast Milk  LATCH Score                    Lactation Tools Discussed/Used Tools: Pump;Bottle;Coconut oil Breast pump type: Double-Electric Breast Pump Pump Education: Setup, frequency, and cleaning;Milk Storage Reason for Pumping: under lights for elevated bili Pumping frequency: q 3hrs  Interventions Interventions: Breast feeding basics reviewed;DEBP;Education  Discharge    Consult Status Consult Status: Follow-up Date: 11/16/20 Follow-up type: Call as needed    Lavonia Drafts 11/16/2020, 9:08 AM

## 2020-11-16 NOTE — Discharge Instructions (Signed)

## 2020-11-16 NOTE — Discharge Summary (Signed)
Postpartum Discharge Summary      Patient Name: Miranda Villegas DOB: Oct 22, 1990 MRN: [id number]  Date of admission: 11/13/2020 Delivery date:11/14/2020  Delivering provider: Rubie Maid  Date of discharge: 11/16/2020  Admitting diagnosis: Obesity in pregnancy  Intrauterine pregnancy: [redacted]w[redacted]d    Secondary diagnosis:  Principal Problem: Active Problems:   History of Hodgkin's lymphoma   Term pregnancy   Anxiety and depression   History of anemia Additional problems:  None    Discharge diagnosis: Term Pregnancy Delivered and Anemia, UTI symptoms                                              Post partum procedures:None Augmentation: AROM, Pitocin, Cytotec and IP Foley Complications: None  Hospital course: Induction of Labor With Vaginal Delivery   30y.o. yo G1P1001 at 475w0das admitted to the hospital 11/13/2020 for induction of labor.  Indication for induction: Obesity in pregnancy (BMI > 40).  Patient had an uncomplicated labor course as follows: Membrane Rupture Time/Date: 12:44 PM ,11/13/2020   Delivery Method:Vaginal, Spontaneous   "JuBonnee QuinEpisiotomy: None  Lacerations:  1st degree  Details of delivery can be found in separate delivery note.  Patient had a routine postpartum course. Patient is discharged home 11/16/20.  Newborn Data: Birth date:11/14/2020  Birth time:2:22 AM  Gender:Female  Living status:Living  Apgars:7 ,9  Weight:3460 g   Magnesium Sulfate received: No BMZ received: No Rhophylac:No MMR:No T-DaP:Given prenatally Flu: No Transfusion:No  Physical exam  Vitals:   11/15/20 0741 11/15/20 1500 11/15/20 2300 11/16/20 0800  BP: 127/73 129/73 135/80 123/63  Pulse: 94 99 94 99  Resp: 18 18 18 20   Temp: 98.4 F (36.9 C) (!) 97 F (36.1 C) 98.1 F (36.7 C) 98.5 F (36.9 C)  TempSrc: Oral Oral Oral Oral  SpO2: 96% 100% 100% 99%  Weight:      Height:       General: alert, cooperative and no distress Lochia: appropriate Uterine  Fundus: firm Incision: N/A DVT Evaluation: No evidence of DVT seen on physical exam. Negative Homan's sign. No cords or calf tenderness. No significant calf/ankle edema.   Labs: Lab Results  Component Value Date   WBC 13.8 (H) 11/14/2020   HGB 10.5 (L) 11/14/2020   HCT 31.1 (L) 11/14/2020   MCV 85.0 11/14/2020   PLT 193 11/14/2020   CMP Latest Ref Rng & Units 11/01/2020  Glucose 65 - 99 mg/dL 101(H)  BUN 6 - 20 mg/dL 9  Creatinine 0.57 - 1.00 mg/dL 0.67  Sodium 134 - 144 mmol/L 138  Potassium 3.5 - 5.2 mmol/L 4.3  Chloride 96 - 106 mmol/L 103  CO2 20 - 29 mmol/L 19(L)  Calcium 8.7 - 10.2 mg/dL 9.5  Total Protein 6.0 - 8.5 g/dL 6.1  Total Bilirubin 0.0 - 1.2 mg/dL <0.2  Alkaline Phos 44 - 121 IU/L 114  AST 0 - 40 IU/L 8  ALT 0 - 32 IU/L 10   Edinburgh Score:  Edinburgh Postnatal Depression Scale Screening Tool 11/14/2020  I have been able to laugh and see the funny side of things. 0  I have looked forward with enjoyment to things. 0  I have blamed myself unnecessarily when things went wrong. 1  I have been anxious or worried for no good reason. 1  I have felt scared or panicky  for no good reason. 1  Things have been getting on top of me. 0  I have been so unhappy that I have had difficulty sleeping. 0  I have felt sad or miserable. 0  I have been so unhappy that I have been crying. 0  The thought of harming myself has occurred to me. 0  Edinburgh Postnatal Depression Scale Total 3      After visit meds:  Allergies as of 11/16/2020      Reactions   Adhesive [tape] Rash      Medication List    TAKE these medications   cholecalciferol 1000 units tablet Commonly known as: VITAMIN D Take 2,000 Units by mouth daily.   Citrucel 500 MG Tabs Generic drug: Methylcellulose (Laxative) Take 500 mg by mouth. Take 2 tablets daily   ibuprofen 600 MG tablet Commonly known as: ADVIL Take 1 tablet (600 mg total) by mouth every 6 (six) hours as needed.   Iron 325 (65 Fe) MG  Tabs Take 325 mg by mouth daily. Take two tablets daily   loratadine 10 MG tablet Commonly known as: CLARITIN Take 10 mg by mouth daily.   nitrofurantoin (macrocrystal-monohydrate) 100 MG capsule Commonly known as: Macrobid Take 1 capsule (100 mg total) by mouth 2 (two) times daily for 7 days.   prenatal vitamin w/FE, FA 27-1 MG Tabs tablet Take 1 tablet by mouth daily at 12 noon.   VITAMIN C PO Take 125 mg by mouth. Take 2 tablets daily        Discharge home in stable condition Infant Feeding: Breast Infant Disposition:home with mother Discharge instruction: per After Visit Summary and Postpartum booklet. Activity: Advance as tolerated. Pelvic rest for 6 weeks.  Diet: routine diet Anticipated Birth Control: OCPs Postpartum Appointment:6 weeks Additional Postpartum F/U: Postpartum Depression checkup Future Appointments:No future appointments. Follow up Visit:  Follow-up Information    Rubie Maid, MD Follow up.   Specialties: Obstetrics and Gynecology, Radiology Why: 2 week mood check televisit 6 week postpartum visit Contact information: [address] [place] [address] [phone]                   11/16/2020 Rubie Maid, MD

## 2020-11-18 LAB — URINE CULTURE
Culture: <10000 — AB
Special Requests: NORMAL

## 2020-12-26 ENCOUNTER — Ambulatory Visit (INDEPENDENT_AMBULATORY_CARE_PROVIDER_SITE_OTHER): Payer: Medicaid Other | Admitting: Obstetrics and Gynecology

## 2020-12-26 ENCOUNTER — Other Ambulatory Visit: Payer: Self-pay

## 2020-12-26 ENCOUNTER — Encounter: Payer: Self-pay | Admitting: Obstetrics and Gynecology

## 2020-12-26 DIAGNOSIS — Z8571 Personal history of Hodgkin lymphoma: Secondary | ICD-10-CM

## 2020-12-26 DIAGNOSIS — F32A Depression, unspecified: Secondary | ICD-10-CM

## 2020-12-26 DIAGNOSIS — F419 Anxiety disorder, unspecified: Secondary | ICD-10-CM | POA: Diagnosis not present

## 2020-12-26 NOTE — Progress Notes (Signed)
   OBSTETRICS POSTPARTUM CLINIC PROGRESS NOTE  Subjective:     Miranda Villegas is a 30 y.o. G55P1001 female who presents for a postpartum visit. She is 6 weeks postpartum following a spontaneous vaginal delivery. I have fully reviewed the prenatal and intrapartum course. The delivery was at 85 gestational weeks, IOL for obesity. She had a past history of Hodgkin's lymphoma. Anesthesia: epidural. Postpartum course has been well. Baby's course has been well. Baby is feeding by breast. Bleeding: patient has resumed menses, with LMP starting today, 12/26/2020. Bowel function is normal. Bladder function is normal. Patient is sexually active (had intercourse once, unprotected) Contraception method desired is condoms. Postpartum depression screening: negative.  The following portions of the patient's history were reviewed and updated as appropriate: allergies, current medications, past family history, past medical history, past social history, past surgical history and problem list.  Review of Systems Pertinent items noted in HPI and remainder of comprehensive ROS otherwise negative.   Objective:    BP 135/68   Pulse (!) 109   Ht 5\' 10"  (1.778 m)   Wt 298 lb 14.4 oz (135.6 kg)   LMP 02/08/2020   Breastfeeding No   BMI 42.89 kg/m   General:  alert and no distress   Breasts:  inspection negative, no nipple discharge or bleeding, no masses or nodularity palpable  Lungs: clear to auscultation bilaterally  Heart:  regular rate and rhythm, S1, S2 normal, no murmur, click, rub or gallop  Abdomen: soft, non-tender; bowel sounds normal; no masses,  no organomegaly.     Vulva:  normal  Vagina: normal vagina, no discharge, exudate, lesion, or erythema  Cervix:  no cervical motion tenderness and no lesions  Corpus: normal size, contour, position, consistency, mobility, non-tender  Adnexa:  normal adnexa and no mass, fullness, tenderness  Rectal Exam: Not performed.         Labs:  Lab Results   Component Value Date   HGB 10.5 (L) 11/14/2020     Assessment:   1. Postpartum care following vaginal delivery   2. Anxiety and depression   3. History of Hodgkin's lymphoma    Plan:   1. Contraception: condoms 2. History of anxiety and depression, no meds. Screen negative today. Overall doing very well.  3. H/o Hodgkin's lymphoma, last Oncology visit was September 2020). Can resume routine follow up. 4.  Follow up in: 6 months with previous GYN [facility]) or with [facility] as desired. Can f/u sooner as needed.    Rubie Maid, MD [facility]

## 2020-12-26 NOTE — Progress Notes (Signed)
Pt present for postpartum care. Pt stated that she was doing well. Pt is currently not breastfeeding, sexually activity once since the birth of the baby no protection was used, pt is unsure about birth control at this time. EPDS=1.

## 2021-06-27 ENCOUNTER — Ambulatory Visit (INDEPENDENT_AMBULATORY_CARE_PROVIDER_SITE_OTHER): Payer: Medicaid Other | Admitting: Obstetrics and Gynecology

## 2021-06-27 ENCOUNTER — Other Ambulatory Visit (HOSPITAL_COMMUNITY)
Admission: RE | Admit: 2021-06-27 | Discharge: 2021-06-27 | Disposition: A | Discharge status: Home / Self Care | Payer: Medicaid Other | Source: Ambulatory Visit | Attending: Obstetrics and Gynecology | Admitting: Obstetrics and Gynecology

## 2021-06-27 ENCOUNTER — Other Ambulatory Visit: Payer: Self-pay

## 2021-06-27 ENCOUNTER — Encounter: Payer: Self-pay | Admitting: Obstetrics and Gynecology

## 2021-06-27 VITALS — BP 110/74 | HR 98 | Ht 70.0 in | Wt 302.0 lb

## 2021-06-27 DIAGNOSIS — Z01419 Encounter for gynecological examination (general) (routine) without abnormal findings: Secondary | ICD-10-CM

## 2021-06-27 DIAGNOSIS — Z13220 Encounter for screening for lipoid disorders: Secondary | ICD-10-CM

## 2021-06-27 DIAGNOSIS — Z2821 Immunization not carried out because of patient refusal: Secondary | ICD-10-CM

## 2021-06-27 DIAGNOSIS — Z124 Encounter for screening for malignant neoplasm of cervix: Secondary | ICD-10-CM | POA: Diagnosis present

## 2021-06-27 DIAGNOSIS — Z131 Encounter for screening for diabetes mellitus: Secondary | ICD-10-CM | POA: Diagnosis not present

## 2021-06-27 NOTE — Progress Notes (Addendum)
GYNECOLOGY ANNUAL PHYSICAL EXAM PROGRESS NOTE  Subjective:    Miranda Villegas is a 30 y.o. G84P1001 female who presents for an annual exam. The patient has no complaints today. The patient is sexually active. The patient participates in regular exercise:  yes; 30 minute walks daily . Has the patient ever been transfused or tattooed?: yes. The patient reports that there is not domestic violence in her life.    Menstrual History: Menarche age: 32 Patient's last menstrual period was 05/30/2021. Period Duration (Days): 5 Period Pattern: Regular Menstrual Flow: Moderate Menstrual Control: Other (Comment) Menstrual Control Change Freq (Hours): 6-12 Dysmenorrhea: (!) Mild Dysmenorrhea Symptoms: Cramping   Gynecologic History:  Contraception: none History of STI's: None Last Pap: cannot recall. Results were: normal.  Denies h/o abnormal pap smears.       Upstream - 06/27/21 1427       Pregnancy Intention Screening   Does the patient want to become pregnant in the next year? Ok Either Way    Does the patient's partner want to become pregnant in the next year? Ok Either Way    Would the patient like to discuss contraceptive options today? No      Contraception Wrap Up   Current Method No Method - Other Reason            The pregnancy intention screening data noted above was reviewed. Potential methods of contraception were discussed. The patient elected to proceed with No Method - Other Reason.     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   MEDIASTINOSCOPY N/A 08/09/2013   Procedure: MEDIASTINOSCOPY;  Surgeon: Melrose Nakayama, MD;  Location: [facility];  Service: Thoracic;  Laterality: N/A;   TOOTH EXTRACTION      Family History  Problem Relation Age of Onset   Alzheimer's disease Other    Osteoarthritis Mother    Migraines Mother    Heart disease Other    Hearing loss Other    Parkinsonism Other    Breast cancer Other    Transient ischemic attack Other    Autoimmune disease Other    Hypertension Father    Colon cancer Neg Hx    Stomach cancer Neg Hx     Social History   Socioeconomic History   Marital status: Married    Spouse name: Not on file   Number of children: Not on file   Years of education: Not on file   Highest education level: Not on file  Occupational History   Not on file  Tobacco Use   Smoking status: Never   Smokeless  tobacco: Never  Vaping Use   Vaping Use: Never used  Substance and Sexual Activity   Alcohol use: No   Drug use: No   Sexual activity: Yes    Birth control/protection: None  Other Topics Concern   Not on file  Social History Narrative   Not on file   Social Determinants of Health   Financial Resource Strain: Not on file  Food Insecurity: Not on file  Transportation Needs: Not on file  Physical Activity: Not on file  Stress: Not on file  Social Connections: Not on file  Intimate Partner Violence: Not on file    Current Outpatient Medications on File Prior to Visit  Medication Sig Dispense Refill   Ascorbic Acid (VITAMIN C PO) Take 125 mg by mouth. Take 2 tablets daily     cholecalciferol (VITAMIN D) 1000 units tablet Take 2,000 Units by mouth daily.     Ferrous Sulfate (IRON) 325 (65 Fe) MG TABS Take 325 mg by mouth daily. Take 1/2 tablets once daily     No current  facility-administered medications on file prior to visit.    Allergies  Allergen Reactions   Adhesive [Tape] Rash     Review of Systems Constitutional: negative for chills, fatigue, fevers and sweats Eyes: negative for irritation, redness and visual disturbance Ears, nose, mouth, throat, and face: negative for hearing loss, nasal congestion, snoring and tinnitus Respiratory: negative for asthma, cough, sputum Cardiovascular: negative for chest pain, dyspnea, exertional chest pressure/discomfort, irregular heart beat, palpitations and syncope Gastrointestinal: negative for abdominal pain, change in bowel habits, nausea and vomiting Genitourinary: negative for abnormal menstrual periods, genital lesions, sexual problems and vaginal discharge, dysuria and urinary incontinence Integument/breast: negative for breast lump, breast tenderness and nipple discharge Hematologic/lymphatic: negative for bleeding and easy bruising Musculoskeletal:negative for back pain and muscle weakness Neurological: negative for dizziness, headaches, vertigo and weakness Endocrine: negative for diabetic symptoms including polydipsia, polyuria and skin dryness Allergic/Immunologic: negative for hay fever and urticaria      Objective:  Blood pressure 110/74, pulse 98, height 5\' 10"  (1.778 m), weight (!) 302 lb (137 kg), last menstrual period 05/30/2021, not currently breastfeeding. Body mass index is 43.33 kg/m.    General Appearance:    Alert, cooperative, no distress, appears stated age, morbidly obese  Head:    Normocephalic, without obvious abnormality, atraumatic  Eyes:    PERRL, conjunctiva/corneas clear, EOM's intact, both eyes  Ears:    Normal external ear canals, both ears  Nose:   Nares normal, septum midline, mucosa normal, no drainage or sinus tenderness  Throat:   Lips, mucosa, and tongue normal; teeth and gums normal  Neck:   Supple, symmetrical, trachea midline, no adenopathy; thyroid: no  enlargement/tenderness/nodules; no carotid bruit or JVD  Back:     Symmetric, no curvature, ROM normal, no CVA tenderness  Lungs:     Clear to auscultation bilaterally, respirations unlabored  Chest Wall:    No tenderness or deformity   Heart:    Regular rate and rhythm, S1 and S2 normal, no murmur, rub or gallop  Breast Exam:    No tenderness, masses, or nipple abnormality  Abdomen:     Soft, non-tender, bowel sounds active all four quadrants, no masses, no organomegaly.    Genitalia:    Pelvic:external genitalia normal, vagina without lesions, discharge, or tenderness, rectovaginal septum  normal. Cervix normal in appearance, no cervical motion tenderness, no adnexal masses or tenderness.  Uterus normal size, shape, mobile, regular contours, nontender.  Rectal:    Normal external sphincter.  No hemorrhoids appreciated. Internal exam not done.   Extremities:   Extremities normal, atraumatic, no cyanosis or edema  Pulses:   2+ and symmetric all extremities  Skin:   Skin color, texture, turgor normal, no rashes or lesions  Lymph nodes:   Cervical, supraclavicular, and axillary nodes normal  Neurologic:   CNII-XII intact, normal strength, sensation and reflexes throughout   .  Labs:  Lab Results  Component Value Date   WBC 13.8 (H) 11/14/2020   HGB 10.5 (L) 11/14/2020   HCT 31.1 (L) 11/14/2020   MCV 85.0 11/14/2020   PLT 193 11/14/2020    Lab Results  Component Value Date   CREATININE 0.67 11/01/2020   BUN 9 11/01/2020   NA 138 11/01/2020   K 4.3 11/01/2020   CL 103 11/01/2020   CO2 19 (L) 11/01/2020    Lab Results  Component Value Date   ALT 10 11/01/2020   AST 8 11/01/2020   ALKPHOS 114 11/01/2020   BILITOT <0.2 11/01/2020    No results found for: TSH   Assessment:   1. Encounter for well woman exam with routine gynecological exam   2. Pap smear for cervical cancer screening   3. Screening for diabetes mellitus   4. Cervical cancer screening   5. Screening for  lipid disorders      Plan:  Blood tests: CBC with diff, Comprehensive metabolic panel, TSH, Lipid panel, and A1C. Breast self exam technique reviewed and patient encouraged to perform self-exam monthly. Contraception: none.  Discussed healthy lifestyle modifications. Pap smear ordered. COVID vaccination status: declined Flu vaccine: declined.  Follow up in 1 year for annual exam    Rubie Maid, MD [facility]

## 2021-06-27 NOTE — Patient Instructions (Signed)
Breast Self-Awareness Breast self-awareness is knowing how your breasts look and feel. Doing breast self-awareness is important. It allows you to catch a breast problem early while it is still small and can be treated. All women should do breast self-awareness, including women who have had breast implants. Tell your doctor if you notice a change in your breasts. What you need: A mirror. A well-lit room. How to do a breast self-exam A breast self-exam is one way to learn what is normal for your breasts and to check for changes. To do a breast self-exam: Look for changes  Take off all the clothes above your waist. Stand in front of a mirror in a room with good lighting. Put your hands on your hips. Push your hands down. Look at your breasts and nipples in the mirror to see if one breast or nipple looks different from the other. Check to see if: The shape of one breast is different. The size of one breast is different. There are wrinkles, dips, and bumps in one breast and not the other. Look at each breast for changes in the skin, such as: Redness. Scaly areas. Look for changes in your nipples, such as: Liquid around the nipples. Bleeding. Dimpling. Redness. A change in where the nipples are. Feel for changes  Lie on your back on the floor. Feel each breast. To do this, follow these steps: Pick a breast to feel. Put the arm closest to that breast above your head. Use your other arm to feel the nipple area of your breast. Feel the area with the pads of your three middle fingers by making small circles with your fingers. For the first circle, press lightly. For the second circle, press harder. For the third circle, press even harder. Keep making circles with your fingers at the different pressures as you move down your breast. Stop when you feel your ribs. Move your fingers a little toward the center of your body. Start making circles with your fingers again, this time going up until  you reach your collarbone. Keep making up-and-down circles until you reach your armpit. Remember to keep using the three pressures. Feel the other breast in the same way. Sit or stand in the tub or shower. With soapy water on your skin, feel each breast the same way you did in step 2 when you were lying on the floor. Write down what you find Writing down what you find can help you remember what to tell your doctor. Write down: What is normal for each breast. Any changes you find in each breast, including: The kind of changes you find. Whether you have pain. Size and location of any lumps. When you last had your menstrual period. General tips Check your breasts every month. If you are breastfeeding, the best time to check your breasts is after you feed your baby or after you use a breast pump. If you get menstrual periods, the best time to check your breasts is 5-7 days after your menstrual period is over. With time, you will become comfortable with the self-exam, and you will begin to know if there are changes in your breasts. Contact a doctor if you: See a change in the shape or size of your breasts or nipples. See a change in the skin of your breast or nipples, such as red or scaly skin. Have fluid coming from your nipples that is not normal. Find a lump or thick area that was not there before. Have pain in  your breasts. Have any concerns about your breast health. Summary Breast self-awareness includes looking for changes in your breasts, as well as feeling for changes within your breasts. Breast self-awareness should be done in front of a mirror in a well-lit room. You should check your breasts every month. If you get menstrual periods, the best time to check your breasts is 5-7 days after your menstrual period is over. Let your doctor know of any changes you see in your breasts, including changes in size, changes on the skin, pain or tenderness, or fluid from your nipples that is not  normal. This information is not intended to replace advice given to you by your health care provider. Make sure you discuss any questions you have with your health care provider. Document Revised: 04/28/2018 Document Reviewed: 04/28/2018 Elsevier Patient Education  2022 [employer]     Preventive Care 21-39 Years Old, Female Preventive care refers to lifestyle choices and visits with your health care provider that can promote health and wellness. This includes: A yearly physical exam. This is also called an annual wellness visit. Regular dental and eye exams. Immunizations. Screening for certain conditions. Healthy lifestyle choices, such as: Eating a healthy diet. Getting regular exercise. Not using drugs or products that contain nicotine and tobacco. Limiting alcohol use. What can I expect for my preventive care visit? Physical exam Your health care provider may check your: Height and weight. These may be used to calculate your BMI (body mass index). BMI is a measurement that tells if you are at a healthy weight. Heart rate and blood pressure. Body temperature. Skin for abnormal spots. Counseling Your health care provider may ask you questions about your: Past medical problems. Family's medical history. Alcohol, tobacco, and drug use. Emotional well-being. Home life and relationship well-being. Sexual activity. Diet, exercise, and sleep habits. Work and work environment. Access to firearms. Method of birth control. Menstrual cycle. Pregnancy history. What immunizations do I need? Vaccines are usually given at various ages, according to a schedule. Your health care provider will recommend vaccines for you based on your age, medical history, and lifestyle or other factors, such as travel or where you work. What tests do I need? Blood tests Lipid and cholesterol levels. These may be checked every 5 years starting at age 20. Hepatitis C test. Hepatitis B  test. Screening Diabetes screening. This is done by checking your blood sugar (glucose) after you have not eaten for a while (fasting). STD (sexually transmitted disease) testing, if you are at risk. BRCA-related cancer screening. This may be done if you have a family history of breast, ovarian, tubal, or peritoneal cancers. Pelvic exam and Pap test. This may be done every 3 years starting at age 21. Starting at age 30, this may be done every 5 years if you have a Pap test in combination with an HPV test. Talk with your health care provider about your test results, treatment options, and if necessary, the need for more tests. Follow these instructions at home: Eating and drinking  Eat a healthy diet that includes fresh fruits and vegetables, whole grains, lean protein, and low-fat dairy products. Take vitamin and mineral supplements as recommended by your health care provider. Do not drink alcohol if: Your health care provider tells you not to drink. You are pregnant, may be pregnant, or are planning to become pregnant. If you drink alcohol: Limit how much you have to 0-1 drink a day. Be aware of how much alcohol is in your   drink. In the [place] one drink equals one 12 oz bottle of beer (355 mL), one 5 oz glass of wine (148 mL), or one 1 oz glass of hard liquor (44 mL). Lifestyle Take daily care of your teeth and gums. Brush your teeth every morning and night with fluoride toothpaste. Floss one time each day. Stay active. Exercise for at least 30 minutes 5 or more days each week. Do not use any products that contain nicotine or tobacco, such as cigarettes, e-cigarettes, and chewing tobacco. If you need help quitting, ask your health care provider. Do not use drugs. If you are sexually active, practice safe sex. Use a condom or other form of protection to prevent STIs (sexually transmitted infections). If you do not wish to become pregnant, use a form of birth control. If you plan to become  pregnant, see your health care provider for a prepregnancy visit. Find healthy ways to cope with stress, such as: Meditation, yoga, or listening to music. Journaling. Talking to a trusted person. Spending time with friends and family. Safety Always wear your seat belt while driving or riding in a vehicle. Do not drive: If you have been drinking alcohol. Do not ride with someone who has been drinking. When you are tired or distracted. While texting. Wear a helmet and other protective equipment during sports activities. If you have firearms in your house, make sure you follow all gun safety procedures. Seek help if you have been physically or sexually abused. What's next? Go to your health care provider once a year for an annual wellness visit. Ask your health care provider how often you should have your eyes and teeth checked. Stay up to date on all vaccines. This information is not intended to replace advice given to you by your health care provider. Make sure you discuss any questions you have with your health care provider. Document Revised: 11/17/2020 Document Reviewed: 05/21/2018 Elsevier Patient Education  2022 [employer].

## 2021-06-28 LAB — LIPID PANEL
Chol/HDL Ratio: 4.1 ratio (ref 0.0–4.4)
Cholesterol, Total: 208 mg/dL — ABNORMAL HIGH (ref 100–199)
HDL: 51 mg/dL (ref >39)
LDL Chol Calc (NIH): 124 mg/dL — ABNORMAL HIGH (ref 0–99)
Triglycerides: 190 mg/dL — ABNORMAL HIGH (ref 0–149)
VLDL Cholesterol Cal: 33 mg/dL (ref 5–40)

## 2021-06-28 LAB — COMPREHENSIVE METABOLIC PANEL
ALT: 17 IU/L (ref 0–32)
AST: 12 IU/L (ref 0–40)
Albumin/Globulin Ratio: 2 (ref 1.2–2.2)
Albumin: 4.7 g/dL (ref 3.9–5.0)
Alkaline Phosphatase: 104 IU/L (ref 44–121)
BUN/Creatinine Ratio: 11 (ref 9–23)
BUN: 9 mg/dL (ref 6–20)
Bilirubin Total: 0.3 mg/dL (ref 0.0–1.2)
CO2: 17 mmol/L — ABNORMAL LOW (ref 20–29)
Calcium: 9.7 mg/dL (ref 8.7–10.2)
Chloride: 101 mmol/L (ref 96–106)
Creatinine, Ser: 0.79 mg/dL (ref 0.57–1.00)
Globulin, Total: 2.4 g/dL (ref 1.5–4.5)
Glucose: 89 mg/dL (ref 70–99)
Potassium: 4.3 mmol/L (ref 3.5–5.2)
Sodium: 138 mmol/L (ref 134–144)
Total Protein: 7.1 g/dL (ref 6.0–8.5)
eGFR: 104 mL/min/{1.73_m2} (ref >59)

## 2021-06-28 LAB — CBC
Hematocrit: 39.9 % (ref 34.0–46.6)
Hemoglobin: 12.8 g/dL (ref 11.1–15.9)
MCH: 25.2 pg — ABNORMAL LOW (ref 26.6–33.0)
MCHC: 32.1 g/dL (ref 31.5–35.7)
MCV: 79 fL (ref 79–97)
Platelets: 369 10*3/uL (ref 150–450)
RBC: 5.07 x10E6/uL (ref 3.77–5.28)
RDW: 14.6 % (ref 11.7–15.4)
WBC: 9 10*3/uL (ref 3.4–10.8)

## 2021-06-28 LAB — HEMOGLOBIN A1C
Est. average glucose Bld gHb Est-mCnc: 114 mg/dL
Hgb A1c MFr Bld: 5.6 % (ref 4.8–5.6)

## 2021-06-28 LAB — TSH: TSH: 2.04 u[IU]/mL (ref 0.450–4.500)

## 2021-07-02 LAB — CYTOLOGY - PAP: Diagnosis: NEGATIVE

## 2021-12-07 ENCOUNTER — Ambulatory Visit (INDEPENDENT_AMBULATORY_CARE_PROVIDER_SITE_OTHER): Payer: Medicaid Other | Admitting: Obstetrics and Gynecology

## 2021-12-07 ENCOUNTER — Other Ambulatory Visit: Payer: Self-pay

## 2021-12-07 ENCOUNTER — Encounter: Payer: Self-pay | Admitting: Obstetrics and Gynecology

## 2021-12-07 VITALS — BP 135/92 | HR 125 | Ht 70.0 in | Wt 309.6 lb

## 2021-12-07 DIAGNOSIS — Z3201 Encounter for pregnancy test, result positive: Secondary | ICD-10-CM

## 2021-12-07 DIAGNOSIS — R030 Elevated blood-pressure reading, without diagnosis of hypertension: Secondary | ICD-10-CM | POA: Diagnosis not present

## 2021-12-07 DIAGNOSIS — Z3200 Encounter for pregnancy test, result unknown: Secondary | ICD-10-CM | POA: Diagnosis not present

## 2021-12-07 DIAGNOSIS — N912 Amenorrhea, unspecified: Secondary | ICD-10-CM

## 2021-12-07 LAB — POCT URINE PREGNANCY: Preg Test, Ur: POSITIVE — AB

## 2021-12-07 NOTE — Progress Notes (Signed)
? ?  GYNECOLOGY CLINIC PROGRESS NOTE ? ?Subjective:  ? ? Miranda Villegas is a 31 y.o. female who presents for evaluation of amenorrhea. She believes she could be pregnant. Pregnancy is desired. Sexual Activity: single partner, contraception: none. Current symptoms also include: morning sickness and positive home pregnancy test. Last period was normal. ?  ?Patient's last menstrual period was 10/30/2021 (exact date). ?The following portions of the patient's history were reviewed and updated as appropriate: allergies, current medications, past family history, past medical history, past social history, past surgical history, and problem list. ? ?Review of Systems ?Pertinent items noted in HPI and remainder of comprehensive ROS otherwise negative.   ?  ?Objective:  ? ? BP (!) 135/92   Pulse (!) 125   Ht '5\' 10"'$  (1.778 m)   Wt (!) 309 lb 9.6 oz (140.4 kg)   LMP 10/30/2021 (Exact Date)   Breastfeeding No   BMI 44.42 kg/m?  ?General: alert, no distress, and no acute distress   ? ?Lab Review ?Urine HCG: positive  ?  ?Assessment:  ? ? Absence of menstruation. ?  Positive Pregnancy test ? Borderline elevated BP ?Plan:  ? ?- Pregnancy Test: Positive: EDC: 08/06/2022, with EGA 5.3 weeks. Briefly discussed pre-natal care options.  Encouraged well-balanced diet, plenty of rest when needed, pre-natal vitamins daily and walking for exercise. Discussed self-help for nausea, avoiding OTC medications until consulting provider or pharmacist, other than Tylenol as needed, minimal caffeine (1-2 cups daily) and avoiding alcohol. She will schedule her initial OB intake visit and ultrasound within the next month  ?- Continue to monitor BPs, borderline elevated today.  ? ? ?

## 2021-12-10 ENCOUNTER — Encounter: Payer: Self-pay | Admitting: Obstetrics and Gynecology

## 2021-12-17 ENCOUNTER — Other Ambulatory Visit: Payer: Self-pay | Admitting: Obstetrics and Gynecology

## 2021-12-17 ENCOUNTER — Other Ambulatory Visit: Payer: Self-pay

## 2021-12-17 ENCOUNTER — Ambulatory Visit (INDEPENDENT_AMBULATORY_CARE_PROVIDER_SITE_OTHER): Payer: Medicaid Other

## 2021-12-17 ENCOUNTER — Ambulatory Visit (INDEPENDENT_AMBULATORY_CARE_PROVIDER_SITE_OTHER): Payer: Medicaid Other | Admitting: Obstetrics and Gynecology

## 2021-12-17 VITALS — BP 140/83 | HR 113 | Resp 16 | Ht 70.0 in | Wt 311.3 lb

## 2021-12-17 DIAGNOSIS — O99210 Obesity complicating pregnancy, unspecified trimester: Secondary | ICD-10-CM | POA: Diagnosis not present

## 2021-12-17 DIAGNOSIS — Z3A01 Less than 8 weeks gestation of pregnancy: Secondary | ICD-10-CM

## 2021-12-17 DIAGNOSIS — Z3201 Encounter for pregnancy test, result positive: Secondary | ICD-10-CM

## 2021-12-17 DIAGNOSIS — Z113 Encounter for screening for infections with a predominantly sexual mode of transmission: Secondary | ICD-10-CM | POA: Diagnosis not present

## 2021-12-17 DIAGNOSIS — Z3481 Encounter for supervision of other normal pregnancy, first trimester: Secondary | ICD-10-CM

## 2021-12-17 DIAGNOSIS — O30041 Twin pregnancy, dichorionic/diamniotic, first trimester: Secondary | ICD-10-CM

## 2021-12-17 DIAGNOSIS — N912 Amenorrhea, unspecified: Secondary | ICD-10-CM

## 2021-12-17 NOTE — Patient Instructions (Incomplete)
First Trimester of Pregnancy ?The first trimester of pregnancy starts on the first day of your last menstrual period until the end of week 12. This is also called months 1 through 3 of pregnancy. ?Body changes during your first trimester ?Your body goes through many changes during pregnancy. The changes usually return to normal after your baby is born. ?Physical changes ?You may gain or lose weight. ?Your breasts may grow larger and hurt. The area around your nipples may get darker. ?Dark spots or blotches may develop on your face. ?You may have changes in your hair. ?Health changes ?You may feel like you might vomit (nauseous), and you may vomit. ?You may have heartburn. ?You may have headaches. ?You may have trouble pooping (constipation). ?Your gums may bleed. ?Other changes ?You may get tired easily. ?You may pee (urinate) more often. ?Your menstrual periods will stop. ?You may not feel hungry. ?You may want to eat certain kinds of food. ?You may have changes in your emotions from day to day. ?You may have more dreams. ?Follow these instructions at home: ?Medicines ?Take over-the-counter and prescription medicines only as told by your doctor. Some medicines are not safe during pregnancy. ?Take a prenatal vitamin that contains at least 600 micrograms (mcg) of folic acid. ?Eating and drinking ?Eat healthy meals that include: ?Fresh fruits and vegetables. ?Whole grains. ?Good sources of protein, such as meat, eggs, or tofu. ?Low-fat dairy products. ?Avoid raw meat and unpasteurized juice, milk, and cheese. ?If you feel like you may vomit, or you vomit: ?Eat 4 or 5 small meals a day instead of 3 large meals. ?Try eating a few soda crackers. ?Drink liquids between meals instead of during meals. ?You may need to take these actions to prevent or treat trouble pooping: ?Drink enough fluids to keep your pee (urine) pale yellow. ?Eat foods that are high in fiber. These include beans, whole grains, and fresh fruits and  vegetables. ?Limit foods that are high in fat and sugar. These include fried or sweet foods. ?Activity ?Exercise only as told by your doctor. Most people can do their usual exercise routine during pregnancy. ?Stop exercising if you have cramps or pain in your lower belly (abdomen) or low back. ?Do not exercise if it is too hot or too humid, or if you are in a place of great height (high altitude). ?Avoid heavy lifting. ?If you choose to, you may have sex unless your doctor tells you not to. ?Relieving pain and discomfort ?Wear a good support bra if your breasts are sore. ?Rest with your legs raised (elevated) if you have leg cramps or low back pain. ?If you have bulging veins (varicose veins) in your legs: ?Wear support hose as told by your doctor. ?Raise your feet for 15 minutes, 3-4 times a day. ?Limit salt in your food. ?Safety ?Wear your seat belt at all times when you are in a car. ?Talk with your doctor if someone is hurting you or yelling at you. ?Talk with your doctor if you are feeling sad or have thoughts of hurting yourself. ?Lifestyle ?Do not use hot tubs, steam rooms, or saunas. ?Do not douche. Do not use tampons or scented sanitary pads. ?Do not use herbal medicines, illegal drugs, or medicines that are not approved by your doctor. Do not drink alcohol. ?Do not smoke or use any products that contain nicotine or tobacco. If you need help quitting, ask your doctor. ?Avoid cat litter boxes and soil that is used by cats. These carry germs  that can cause harm to the baby and can cause a loss of your baby by miscarriage or stillbirth. ?General instructions ?Keep all follow-up visits. This is important. ?Ask for help if you need counseling or if you need help with nutrition. Your doctor can give you advice or tell you where to go for help. ?Visit your dentist. At home, brush your teeth with a soft toothbrush. Floss gently. ?Write down your questions. Take them to your prenatal visits. ?Where to find more  information ?American Pregnancy Association: americanpregnancy.org ?[employer]: www.acog.org ?Office on Women's Health: KeywordPortfolios.com.br ?Contact a doctor if: ?You are dizzy. ?You have a fever. ?You have mild cramps or pressure in your lower belly. ?You have a nagging pain in your belly area. ?You continue to feel like you may vomit, you vomit, or you have watery poop (diarrhea) for 24 hours or longer. ?You have a bad-smelling fluid coming from your vagina. ?You have pain when you pee. ?You are exposed to a disease that spreads from person to person, such as chickenpox, measles, Zika virus, HIV, or hepatitis. ?Get help right away if: ?You have spotting or bleeding from your vagina. ?You have very bad belly cramping or pain. ?You have shortness of breath or chest pain. ?You have any kind of injury, such as from a fall or a car crash. ?You have new or increased pain, swelling, or redness in an arm or leg. ?Summary ?The first trimester of pregnancy starts on the first day of your last menstrual period until the end of week 12 (months 1 through 3). ?Eat 4 or 5 small meals a day instead of 3 large meals. ?Do not smoke or use any products that contain nicotine or tobacco. If you need help quitting, ask your doctor. ?Keep all follow-up visits. ?This information is not intended to replace advice given to you by your health care provider. Make sure you discuss any questions you have with your health care provider. ?Document Revised: 02/16/2020 Document Reviewed: 12/23/2019 ?Elsevier Patient Education ? [place]. ?Commonly Asked Questions During Pregnancy ? ?Cats: A parasite can be excreted in cat feces.  To avoid exposure you need to have another person empty the little box.  If you must empty the litter box you will need to wear gloves.  Wash your hands after handling your cat.  This parasite can also be found in raw or undercooked meat so this should also be  avoided. ? ?Colds, Sore Throats, Flu: Please check your medication sheet to see what you can take for symptoms.  If your symptoms are unrelieved by these medications please call the office. ? ?Dental Work: Most any dental work Investment banker, corporate recommends is permitted.  X-rays should only be taken during the first trimester if absolutely necessary.  Your abdomen should be shielded with a lead apron during all x-rays.  Please notify your provider prior to receiving any x-rays.  Novocaine is fine; gas is not recommended.  If your dentist requires a note from [place] prior to dental work please call the office and we will provide one for you. ? ?Exercise: Exercise is an important part of staying healthy during your pregnancy.  You may continue most exercises you were accustomed to prior to pregnancy.  Later in your pregnancy you will most likely notice you have difficulty with activities requiring balance like riding a bicycle.  It is important that you listen to your body and avoid activities that put you at a higher risk  of falling.  Adequate rest and staying well hydrated are a must!  If you have questions about the safety of specific activities ask your provider.   ? ?Exposure to Children with illness: Try to avoid obvious exposure; report any symptoms to [place] when noted,  If you have chicken pos, red measles or mumps, you should be immune to these diseases.   Please do not take any vaccines while pregnant unless you have checked with your OB provider. ? ?Fetal Movement: After 28 weeks we recommend you do "kick counts" twice daily.  Lie or sit down in a calm quiet environment and count your baby movements "kicks".  You should feel your baby at least 10 times per hour.  If you have not felt 10 kicks within the first hour get up, walk around and have something sweet to eat or drink then repeat for an additional hour.  If count remains less than 10 per hour notify your provider. ? ?Fumigating: Follow your pest control agent's  advice as to how long to stay out of your home.  Ventilate the area well before re-entering. ? ?Hemorrhoids:   Most over-the-counter preparations can be used during pregnancy.  Check your medication to see what is s

## 2021-12-17 NOTE — Progress Notes (Signed)
Miranda Villegas presents for NOB nurse interview visit. Pregnancy confirmation done 12/07/2021 by Dr. Marcelline Mates.  ([id number]) Pregnancy education material explained and given. No cats in the home. NOB labs ordered. TSH/HbgA1c due to Increased BMI. Body mass index is 44.67 kg/m?Marland Kitchen HIV labs and Drug screen were explained optional and she did decline. Drug screen declined. PNV encouraged. Genetic screening options discussed. Genetic testing: Declined.  Pt may discuss with provider.  Financial policy not applicable, she has medicaid. FMLA form unsigned, she is not working. Patient to follow up with Dr. Marcelline Mates around 5 weeks for NOB physical.  All questions answered. ? ? ?

## 2021-12-18 LAB — URINALYSIS, ROUTINE W REFLEX MICROSCOPIC
Bilirubin, UA: NEGATIVE
Glucose, UA: NEGATIVE
Ketones, UA: NEGATIVE
Leukocytes,UA: NEGATIVE
Nitrite, UA: NEGATIVE
Protein,UA: NEGATIVE
RBC, UA: NEGATIVE
Specific Gravity, UA: 1.026 (ref 1.005–1.030)
Urobilinogen, Ur: 0.2 mg/dL (ref 0.2–1.0)
pH, UA: 6 (ref 5.0–7.5)

## 2021-12-18 LAB — GC/CHLAMYDIA PROBE AMP
Chlamydia trachomatis, NAA: NEGATIVE
Neisseria Gonorrhoeae by PCR: NEGATIVE

## 2021-12-19 LAB — CULTURE, OB URINE

## 2021-12-19 LAB — URINE CULTURE, OB REFLEX

## 2021-12-23 ENCOUNTER — Other Ambulatory Visit: Payer: Self-pay | Admitting: Obstetrics and Gynecology

## 2021-12-23 DIAGNOSIS — O3680X1 Pregnancy with inconclusive fetal viability, fetus 1: Secondary | ICD-10-CM

## 2021-12-23 DIAGNOSIS — O30041 Twin pregnancy, dichorionic/diamniotic, first trimester: Secondary | ICD-10-CM

## 2022-01-07 LAB — TSH: TSH: 3.02 u[IU]/mL (ref 0.450–4.500)

## 2022-01-07 LAB — ABO AND RH: Rh Factor: POSITIVE

## 2022-01-07 LAB — HEMOGLOBIN A1C
Est. average glucose Bld gHb Est-mCnc: 114 mg/dL
Hgb A1c MFr Bld: 5.6 % (ref 4.8–5.6)

## 2022-01-07 LAB — VIRAL HEPATITIS HBV, HCV
HCV Ab: NONREACTIVE
Hep B Core Total Ab: NEGATIVE
Hep B Surface Ab, Qual: NONREACTIVE

## 2022-01-07 LAB — RPR: RPR Ser Ql: NONREACTIVE

## 2022-01-07 LAB — PARVOVIRUS B19 ANTIBODY, IGG AND IGM
Parvovirus B19 IgG: 5.6 index — ABNORMAL HIGH (ref 0.0–0.8)
Parvovirus B19 IgM: 0.4 index (ref 0.0–0.8)

## 2022-01-07 LAB — HCV INTERPRETATION

## 2022-01-07 LAB — RUBELLA SCREEN: Rubella Antibodies, IGG: <0.9 index — ABNORMAL LOW (ref >0.99)

## 2022-01-07 LAB — ANTIBODY SCREEN: Antibody Screen: NEGATIVE

## 2022-01-07 LAB — HIV ANTIBODY (ROUTINE TESTING W REFLEX)

## 2022-01-07 LAB — VARICELLA ZOSTER ANTIBODY, IGG: Varicella zoster IgG: 262 index (ref >165)

## 2022-01-09 ENCOUNTER — Ambulatory Visit (INDEPENDENT_AMBULATORY_CARE_PROVIDER_SITE_OTHER): Payer: Medicaid Other | Admitting: Obstetrics and Gynecology

## 2022-01-09 ENCOUNTER — Other Ambulatory Visit: Payer: Medicaid Other

## 2022-01-09 ENCOUNTER — Encounter: Payer: Medicaid Other | Admitting: Obstetrics and Gynecology

## 2022-01-09 ENCOUNTER — Encounter: Payer: Self-pay | Admitting: Obstetrics and Gynecology

## 2022-01-09 VITALS — BP 143/85 | HR 112 | Wt 301.1 lb

## 2022-01-09 DIAGNOSIS — Z8571 Personal history of Hodgkin lymphoma: Secondary | ICD-10-CM | POA: Diagnosis not present

## 2022-01-09 DIAGNOSIS — O09891 Supervision of other high risk pregnancies, first trimester: Secondary | ICD-10-CM

## 2022-01-09 DIAGNOSIS — O0991 Supervision of high risk pregnancy, unspecified, first trimester: Secondary | ICD-10-CM

## 2022-01-09 DIAGNOSIS — Z3A10 10 weeks gestation of pregnancy: Secondary | ICD-10-CM | POA: Diagnosis not present

## 2022-01-09 DIAGNOSIS — O161 Unspecified maternal hypertension, first trimester: Secondary | ICD-10-CM | POA: Diagnosis not present

## 2022-01-09 DIAGNOSIS — O99210 Obesity complicating pregnancy, unspecified trimester: Secondary | ICD-10-CM

## 2022-01-09 DIAGNOSIS — O30041 Twin pregnancy, dichorionic/diamniotic, first trimester: Secondary | ICD-10-CM

## 2022-01-09 DIAGNOSIS — Z3481 Encounter for supervision of other normal pregnancy, first trimester: Secondary | ICD-10-CM

## 2022-01-09 LAB — POCT URINALYSIS DIPSTICK OB
Bilirubin, UA: NEGATIVE
Blood, UA: NEGATIVE
Glucose, UA: NEGATIVE
Ketones, UA: POSITIVE
Leukocytes, UA: NEGATIVE
Nitrite, UA: NEGATIVE
Spec Grav, UA: 1.025 (ref 1.010–1.025)
Urobilinogen, UA: 0.2 E.U./dL
pH, UA: 6 (ref 5.0–8.0)

## 2022-01-09 MED ORDER — ASPIRIN EC 81 MG PO TBEC: 81.0000 mg | DELAYED_RELEASE_TABLET | Freq: Every day | ORAL | 2 refills | Status: DC

## 2022-01-09 NOTE — Patient Instructions (Signed)
Multiple Pregnancy ?Multiple pregnancy means that a woman is carrying more than one baby at a time. She may be pregnant with twins, triplets, or more. The majority of multiple pregnancies are twins. Naturally conceiving triplets or more (higher-order multiples) is rare. ?Multiple pregnancies are riskier than single pregnancies. A woman with a multiple pregnancy is more likely to have certain problems during her pregnancy. ?How does a multiple pregnancy happen? ?A multiple pregnancy happens when: ?The woman's body releases more than one egg at a time, and then each egg gets fertilized by a different sperm. ?This is the most common type of multiple pregnancy. ?Twins or other multiples produced this way are called fraternal. They are no more alike than non-multiple siblings are. ?One sperm fertilizes one egg, which then divides into more than one embryo. ?Twins or other multiples produced this way are called identical. Identical multiples are always the same gender, and they look very much alike. ?Who is most likely to have a multiple pregnancy? ?A multiple pregnancy is more likely to develop in women who: ?Have had fertility treatment, especially if the treatment included fertility medicines. ?Are older than 29 years of age. ?Have already had four or more children. ?Have a family history of multiple pregnancy. ?How is a multiple pregnancy diagnosed? ?A multiple pregnancy may be diagnosed based on: ?Symptoms such as: ?Rapid weight gain in the first 3 months of pregnancy (first trimester). ?More severe nausea and breast tenderness than what is typical of a single pregnancy. ?A larger uterus than what is normal for the stage of the pregnancy. ?Blood tests that detect a higher-than-normal level of human chorionic gonadotropin (hCG). This is a hormone that your body produces in early pregnancy. ?An ultrasound exam. This is used to confirm that you are carrying multiples. ?What risks come with multiple pregnancy? ?A  multiple pregnancy puts you at a higher risk for certain problems during or after your pregnancy. These include: ?Delivering your babies before your due date (preterm birth). A full-term pregnancy lasts for at least 37 weeks. ?Babies born before 72 weeks may have a higher risk for breathing problems, feeding difficulties, cerebral palsy, and learning disabilities. ?Diabetes. ?Preeclampsia. This is a serious condition that causes high blood pressure and headaches during pregnancy. ?Too much blood loss after childbirth (postpartum hemorrhage). ?Postpartum depression. ?Low birth weight of the babies. ?How will having a multiple pregnancy affect my care? ?Your health care team will monitor you more closely. You may need more frequent prenatal visits. This will ensure that you are healthy and that your babies are growing normally. ?Follow these instructions at home: ?Eating and drinking ?Increase your nutrition. ?Follow your health care provider's recommendations for weight gain. You may need to gain a little extra weight when you are pregnant with multiples. ?Eat healthy snacks often throughout the day. This will add calories and reduce nausea. ?Drink enough fluid to keep your urine pale yellow. ?Take prenatal vitamins. Ask your health care provider what vitamins are right for you. ?Activity ?Limit your activities by 20-24 weeks of pregnancy. ?Rest often. ?Avoid activities, exercise, and work that take a lot of effort. ?Ask your health care provider when you should stop having sex. ?General instructions ?Do not use any products that contain nicotine or tobacco, such as cigarettes, e-cigarettes, and chewing tobacco. If you need help quitting, ask your health care provider. ?Do not drink alcohol or use illegal drugs. ?Take over-the-counter and prescription medicines only as told by your health care provider. ?Arrange for extra help around  the house. ?Keep all follow-up visits and all prenatal visits as told by your health  care provider. This is important. ?Where to find more information ?American College of Obstetricians and Gynecology: www.acog.org ?Contact a health care provider if: ?You have dizziness. ?You have nausea, vomiting, or diarrhea that does not go away. ?You have depression or other emotions that are interfering with your normal activities. ?You have a fever. ?You have pain with urination. ?You have a bad-smelling vaginal discharge. ?You notice increased swelling in your face, hands, legs, or ankles. ?Get help right away if: ?You have fluid leaking from your vagina. ?You have bleeding from your vagina. ?You have pelvic cramps, pelvic pressure, or nagging pain in your abdomen or lower back. ?You are having regular contractions. ?You have a severe headache, with or without changes in how you see. ?You have chest pain or shortness of breath. ?You notice that your babies move less often, or do not move at all. ?Summary ?Having a multiple pregnancy means that a woman is carrying more than one baby at a time. ?A multiple pregnancy puts you at a higher risk for delivering your babies before your due date, having diabetes, preeclampsia, too much blood loss after childbirth, or low birth weight of the babies. ?Your health care provider will monitor you more closely during your pregnancy. ?You may need to make some lifestyle changes during pregnancy. This includes eating more, limiting your activities after 20-24 weeks of pregnancy, and arranging for extra help around the house. ?Follow up with your health care provider as instructed if you experience any complications. ?This information is not intended to replace advice given to you by your health care provider. Make sure you discuss any questions you have with your health care provider. ?Document Revised: 04/30/2019 Document Reviewed: 05/03/2019 ?Elsevier Patient Education ? [place]. ? ?

## 2022-01-09 NOTE — Progress Notes (Addendum)
? ?OBSTETRIC INITIAL PRENATAL VISIT ? ?Subjective:  ? ? Miranda Villegas is being seen today for her first obstetrical visit.  This is a planned pregnancy. She is a 31 y.o. G2P1001 female at 74w1dgestation with dichorionic diamniotic pregnancy, Estimated Date of Delivery: 08/06/22 with Patient's last menstrual period was 10/30/2021 (exact date).,  consistent with 6 week sono. Her obstetrical history is significant for obesity and history of Hodgkin's lymphoma .  Remote history of anxiety and depression. Also with short interval pregnancy. Relationship with FOB: spouse, living together. Patient does intend to breast feed. Pregnancy history fully reviewed. ? ? ? ?OB History  ?Gravida Para Term Preterm AB Living  ?'2 1 1 '$ 0 0 1  ?SAB IAB Ectopic Multiple Live Births  ?0 0 0 0 1  ?  ?# Outcome Date GA Lbr Len/2nd Weight Sex Delivery Anes PTL Lv  ?2 Current           ?1 Term 11/14/20 [id number]:28 / 00:10 7 lb 10.1 oz (3.46 kg) F Vag-Spont EPI  LIV  ?   Name: Schweigert,GIRL Lanell  ?   Apgar1: 7  Apgar5: 9  ? ? ?Gynecologic History:  ?Last pap smear was 07/07/2021.  Results were Normal. Denies h/o abnormal pap smears in the past.  ?Denies history of STIs.  ?Contraception prior to conception: None ? ? ?Past Medical History:  ?Diagnosis Date  ? Anxiety   ? COVID-19   ? Depression   ? as a [age]ear old  ? Fungal skin infection 09/01/2014  ? hodgkins lymphoma 08/12/13  ? lymphoma recently dx  ? Mass in neck   ? right side   ? ? ?Family History  ?Problem Relation Age of Onset  ? Alzheimer's disease Other   ? Osteoarthritis Mother   ? Migraines Mother   ? Heart disease Other   ? Hearing loss Other   ? Parkinsonism Other   ? Breast cancer Other   ? Transient ischemic attack Other   ? Autoimmune disease Other   ? Hypertension Father   ? Colon cancer Neg Hx   ? Stomach cancer Neg Hx   ? ? ?Past Surgical History:  ?Procedure Laterality Date  ? LYMPH NODE BIOPSY  08/09/13  ? medial neck area - lymphoma dx.  ? MEDIASTINOSCOPY N/A  08/09/2013  ? Procedure: MEDIASTINOSCOPY;  Surgeon: StMelrose NakayamaMD;  [facility]: Thor[facility];  Laterality: N/A;  ? TOOTH EXTRACTION    ? ? ?Social History  ? ?Socioeconomic History  ? Marital status: Married  ?  Spouse name: Not on file  ? Number of children: Not on file  ? Years of education: Not on file  ? Highest education level: Not on file  ?Occupational History  ? Not on file  ?Tobacco Use  ? Smoking status: Never  ?  Passive exposure: Never  ? Smokeless tobacco: Never  ?Vaping Use  ? Vaping Use: Never used  ?Substance and Sexual Activity  ? Alcohol use: No  ? Drug use: No  ? Sexual activity: Yes  ?  Birth control/protection: None  ?Other Topics Concern  ? Not on file  ?Social History Narrative  ? Not on file  ? ?Social Determinants of Health  ? ?Financial Resource Strain: Not on file  ?Food Insecurity: Not on file  ?Transportation Needs: Not on file  ?Physical Activity: Not on file  ?Stress: Not on file  ?Social Connections: Not on file  ?Intimate Partner Violence: Not on  file  ? ? ?Current Outpatient Medications on File Prior to Visit  ?Medication Sig Dispense Refill  ? Ascorbic Acid (VITAMIN C PO) Take 125 mg by mouth. Take 2 tablets daily    ? cholecalciferol (VITAMIN D) 1000 units tablet Take 2,000 Units by mouth daily.    ? Ferrous Sulfate (IRON) 325 (65 Fe) MG TABS Take 325 mg by mouth daily. Take 1/2 tablets once daily    ? loratadine (CLARITIN) 10 MG tablet Take 10 mg by mouth daily.    ? Methylcellulose, Laxative, (CITRUCEL) 500 MG TABS Take by mouth.    ? Prenatal Vit-Fe Fumarate-FA (MULTIVITAMIN-PRENATAL) 27-0.8 MG TABS tablet Take 1 tablet by mouth daily at 12 noon.    ? ?No current facility-administered medications on file prior to visit.  ? ? ?Allergies  ?Allergen Reactions  ? Adhesive [Tape] Rash  ? ? ? ?Review of Systems ?General: Not Present- Fever, Weight Loss and Weight Gain. ?Skin: Not Present- Rash. ?HEENT: Not Present- Blurred Vision, Headache and Bleeding  Gums. ?Respiratory: Not Present- Difficulty Breathing. ?Breast: Not Present- Breast Mass. ?Cardiovascular: Not Present- Chest Pain, Elevated Blood Pressure, Fainting / Blacking Out and Shortness of Breath. ?Gastrointestinal: Not Present- Abdominal Pain, Constipation. Positive for Nausea and Vomiting (mild, managing with OTC meds).   ?Female Genitourinary: Not Present- Frequency, Painful Urination, Pelvic Pain, Vaginal Bleeding, Vaginal Discharge, Contractions, regular, Fetal Movements Decreased, Urinary Complaints and Vaginal Fluid. ?Musculoskeletal: Not Present- Back Pain and Leg Cramps. ?Neurological: Not Present- Dizziness. ?Psychiatric: Not Present- Depression.  ? ?  ?Objective:  ? ?Blood pressure (!) 143/85, pulse (!) 112, weight (!) 301 lb 1.6 oz (136.6 kg), last menstrual period 10/30/2021, not currently breastfeeding.  Body mass index is 43.2 kg/m?. ? ?BP Readings from Last 3 Encounters:  ?01/09/22 (!) 143/85  ?12/17/21 140/83  ?12/07/21 (!) 135/92  ? ? ?Physical Exam as per me from 06/28/2021 Wellness Visit ?General Appearance:    Alert, cooperative, no distress, appears stated age, obese  ?Head:    Normocephalic, without obvious abnormality, atraumatic  ?Eyes:    PERRL, conjunctiva/corneas clear, EOM's intact, both eyes  ?Ears:    Normal external ear canals, both ears  ?Nose:   Nares normal, septum midline, mucosa normal, no drainage or sinus tenderness  ?Throat:   Lips, mucosa, and tongue normal; teeth and gums normal  ?Neck:   Supple, symmetrical, trachea midline, no adenopathy; thyroid: no enlargement/tenderness/nodules; no carotid bruit or JVD  ?Back:     Symmetric, no curvature, ROM normal, no CVA tenderness  ?Lungs:     Clear to auscultation bilaterally, respirations unlabored  ?Chest Wall:    No tenderness or deformity  ? Heart:    Regular rate and rhythm, S1 and S2 normal, no murmur, rub or gallop  ?Breast Exam:    No tenderness, masses, or nipple abnormality  ?Abdomen:     Soft, non-tender, bowel  sounds active all four quadrants, no masses, no organomegaly.  FHT 182/175 bpm (performed on unofficial sono).  ?Genitalia:    Pelvic:deferred today  ?Rectal:   deferred  ?Extremities:   Extremities normal, atraumatic, no cyanosis or edema  ?Pulses:   2+ and symmetric all extremities  ?Skin:   Skin color, texture, turgor normal, no rashes or lesions  ?Lymph nodes:   Cervical, supraclavicular, and axillary nodes normal  ?Neurologic:   CNII-XII intact, normal strength, sensation and reflexes throughout  ? ?  ?Assessment:  ? ?1. Pregnancy, supervision, high-risk, first trimester   ?2. [redacted] weeks gestation of pregnancy   ?  3. Elevated blood pressure affecting pregnancy in first trimester, antepartum   ?4. Dichorionic diamniotic twin pregnancy in first trimester   ?5. Short interval between pregnancies affecting pregnancy in first trimester, antepartum   ?6. Obesity in pregnancy   ?7. History of Hodgkin's lymphoma   ? ? ?Plan:  ? ?Supervision of other high risk pregnancy  ?- Initial labs reviewed.  CBC ordered. Moderate ketones noted on UA today, advised on increasing hydration.  ?- Prenatal vitamins encouraged. ?- Problem list reviewed and updated. ?- New OB counseling:  The patient has been given an overview regarding routine prenatal care.  Recommendations regarding diet, weight gain, and exercise in pregnancy were given. ?- Prenatal testing, optional genetic testing, and ultrasound use in pregnancy were reviewed.  Traditional genetic screening vs cell-fee DNA genetic screening discussed, including risks and benefits. Testing declined. ?- Benefits of Breast Feeding were discussed. The patient is encouraged to consider nursing her baby post partum. ?- The patient has Medicaid.  [facility] Medicaid Risk Screening Form completed today ? ? ?2. Elevated blood pressure affecting pregnancy in first trimester, antepartum ?- Review of last several visits notes mildly elevated blood pressures.  Concerning for possible diagnosis of  chronic hypertension. ?-Advised to start ASA 81 mg at [redacted] weeks gestation. ? ?3. Dichorionic diamniotic twin pregnancy in first trimester ?-Discussed nature of di/di twin pregnancy.  Advised on increased surveillance du

## 2022-01-09 NOTE — Progress Notes (Signed)
NOB Physical: She is doing well. No new concerns. Declines genetic screening. PHM form completed. ?

## 2022-01-10 ENCOUNTER — Encounter: Payer: Self-pay | Admitting: Obstetrics and Gynecology

## 2022-01-10 DIAGNOSIS — O0991 Supervision of high risk pregnancy, unspecified, first trimester: Secondary | ICD-10-CM | POA: Insufficient documentation

## 2022-01-10 DIAGNOSIS — O30041 Twin pregnancy, dichorionic/diamniotic, first trimester: Secondary | ICD-10-CM | POA: Insufficient documentation

## 2022-01-10 DIAGNOSIS — O161 Unspecified maternal hypertension, first trimester: Secondary | ICD-10-CM | POA: Insufficient documentation

## 2022-01-10 DIAGNOSIS — O09891 Supervision of other high risk pregnancies, first trimester: Secondary | ICD-10-CM | POA: Insufficient documentation

## 2022-01-10 LAB — COMPREHENSIVE METABOLIC PANEL
ALT: 13 IU/L (ref 0–32)
AST: 11 IU/L (ref 0–40)
Albumin/Globulin Ratio: 1.5 (ref 1.2–2.2)
Albumin: 4.1 g/dL (ref 3.9–5.0)
Alkaline Phosphatase: 91 IU/L (ref 44–121)
BUN/Creatinine Ratio: 18 (ref 9–23)
BUN: 12 mg/dL (ref 6–20)
Bilirubin Total: <0.2 mg/dL (ref 0.0–1.2)
CO2: 19 mmol/L — ABNORMAL LOW (ref 20–29)
Calcium: 9.9 mg/dL (ref 8.7–10.2)
Chloride: 101 mmol/L (ref 96–106)
Creatinine, Ser: 0.66 mg/dL (ref 0.57–1.00)
Globulin, Total: 2.7 g/dL (ref 1.5–4.5)
Glucose: 84 mg/dL (ref 70–99)
Potassium: 4.4 mmol/L (ref 3.5–5.2)
Sodium: 138 mmol/L (ref 134–144)
Total Protein: 6.8 g/dL (ref 6.0–8.5)
eGFR: 122 mL/min/{1.73_m2} (ref >59)

## 2022-01-10 LAB — PROTEIN / CREATININE RATIO, URINE
Creatinine, Urine: 222 mg/dL
Protein, Ur: 13.6 mg/dL
Protein/Creat Ratio: 61 mg/g creat (ref 0–200)

## 2022-01-10 LAB — CBC
Hematocrit: 38.4 % (ref 34.0–46.6)
Hemoglobin: 12.5 g/dL (ref 11.1–15.9)
MCH: 25.8 pg — ABNORMAL LOW (ref 26.6–33.0)
MCHC: 32.6 g/dL (ref 31.5–35.7)
MCV: 79 fL (ref 79–97)
Platelets: 368 10*3/uL (ref 150–450)
RBC: 4.84 x10E6/uL (ref 3.77–5.28)
RDW: 15.2 % (ref 11.7–15.4)
WBC: 11.7 10*3/uL — ABNORMAL HIGH (ref 3.4–10.8)

## 2022-01-10 MED ORDER — FOLIC ACID 1 MG PO TABS: 1.0000 mg | ORAL_TABLET | Freq: Every day | ORAL | 10 refills | Status: DC

## 2022-01-10 NOTE — Addendum Note (Signed)
Addended by: Augusto Gamble on: 01/10/2022 02:31 PM ? ? Modules accepted: Orders ? ?

## 2022-01-11 ENCOUNTER — Other Ambulatory Visit: Payer: Self-pay | Admitting: Obstetrics and Gynecology

## 2022-01-11 ENCOUNTER — Ambulatory Visit (INDEPENDENT_AMBULATORY_CARE_PROVIDER_SITE_OTHER): Payer: Medicaid Other

## 2022-01-11 DIAGNOSIS — O30041 Twin pregnancy, dichorionic/diamniotic, first trimester: Secondary | ICD-10-CM

## 2022-01-11 DIAGNOSIS — Z3A09 9 weeks gestation of pregnancy: Secondary | ICD-10-CM | POA: Diagnosis not present

## 2022-01-11 DIAGNOSIS — O3680X1 Pregnancy with inconclusive fetal viability, fetus 1: Secondary | ICD-10-CM

## 2022-01-11 DIAGNOSIS — O0991 Supervision of high risk pregnancy, unspecified, first trimester: Secondary | ICD-10-CM

## 2022-01-15 ENCOUNTER — Encounter: Payer: Self-pay | Admitting: Obstetrics and Gynecology

## 2022-01-17 ENCOUNTER — Other Ambulatory Visit: Payer: Medicaid Other

## 2022-02-06 ENCOUNTER — Encounter: Payer: Medicaid Other | Admitting: Obstetrics and Gynecology

## 2022-02-06 DIAGNOSIS — Z3A14 14 weeks gestation of pregnancy: Secondary | ICD-10-CM

## 2022-02-06 DIAGNOSIS — Z3482 Encounter for supervision of other normal pregnancy, second trimester: Secondary | ICD-10-CM

## 2022-02-06 DIAGNOSIS — Z1379 Encounter for other screening for genetic and chromosomal anomalies: Secondary | ICD-10-CM

## 2022-02-12 ENCOUNTER — Encounter: Payer: Self-pay | Admitting: Obstetrics and Gynecology

## 2022-02-12 ENCOUNTER — Ambulatory Visit (INDEPENDENT_AMBULATORY_CARE_PROVIDER_SITE_OTHER): Payer: Medicaid Other | Admitting: Obstetrics and Gynecology

## 2022-02-12 VITALS — BP 126/59 | HR 110 | Wt 309.1 lb

## 2022-02-12 DIAGNOSIS — O99210 Obesity complicating pregnancy, unspecified trimester: Secondary | ICD-10-CM

## 2022-02-12 DIAGNOSIS — O0992 Supervision of high risk pregnancy, unspecified, second trimester: Secondary | ICD-10-CM

## 2022-02-12 DIAGNOSIS — O10912 Unspecified pre-existing hypertension complicating pregnancy, second trimester: Secondary | ICD-10-CM

## 2022-02-12 DIAGNOSIS — O30042 Twin pregnancy, dichorionic/diamniotic, second trimester: Secondary | ICD-10-CM

## 2022-02-12 DIAGNOSIS — O0991 Supervision of high risk pregnancy, unspecified, first trimester: Secondary | ICD-10-CM

## 2022-02-12 DIAGNOSIS — Z3A15 15 weeks gestation of pregnancy: Secondary | ICD-10-CM

## 2022-02-12 LAB — POCT URINALYSIS DIPSTICK OB
Bilirubin, UA: NEGATIVE
Blood, UA: NEGATIVE
Clarity, UA: NEGATIVE
Glucose, UA: NEGATIVE
Ketones, UA: NEGATIVE
Leukocytes, UA: NEGATIVE
Nitrite, UA: NEGATIVE
POC,PROTEIN,UA: NEGATIVE
Spec Grav, UA: 1.02 (ref 1.010–1.025)
Urobilinogen, UA: 0.2 E.U./dL
pH, UA: 6 (ref 5.0–8.0)

## 2022-02-12 NOTE — Progress Notes (Signed)
ROB: She is doing well. Has had some round ligament pain.Declines genetic screening.

## 2022-02-12 NOTE — Progress Notes (Signed)
ROB: Notes some round ligament pain.  Patient otherwise well. Declines AFP today. BPs elevated again today, likely with pre-existing HTN.   RTC in 4 weeks, for anatomy scan then. Will need referral to Anesthesia if BMI >45. The patient has Medicaid.  [facility] Medicaid Risk Screening Form completed today.

## 2022-02-12 NOTE — Patient Instructions (Signed)
Second Trimester of Pregnancy  The second trimester of pregnancy is from week 13 through week 27. This is also called months 4 through 6 of pregnancy. This is often the time when you feel your best. During the second trimester: Morning sickness is less or has stopped. You may have more energy. You may feel hungry more often. At this time, your unborn baby (fetus) is growing very fast. At the end of the sixth month, the unborn baby may be up to 12 inches long and weigh about 1 pounds. You will likely start to feel the baby move between 16 and 20 weeks of pregnancy. Body changes during your second trimester Your body continues to go through many changes during this time. The changes vary and generally return to normal after the baby is born. Physical changes You will gain more weight. You may start to get stretch marks on your hips, belly (abdomen), and breasts. Your breasts will grow and may hurt. Dark spots or blotches may develop on your face. A dark line from your belly button to the pubic area (linea nigra) may appear. You may have changes in your hair. Health changes You may have headaches. You may have heartburn. You may have trouble pooping (constipation). You may have hemorrhoids or swollen, bulging veins (varicose veins). Your gums may bleed. You may pee (urinate) more often. You may have back pain. Follow these instructions at home: Medicines Take over-the-counter and prescription medicines only as told by your doctor. Some medicines are not safe during pregnancy. Take a prenatal vitamin that contains at least 600 micrograms (mcg) of folic acid. Eating and drinking Eat healthy meals that include: Fresh fruits and vegetables. Whole grains. Good sources of protein, such as meat, eggs, or tofu. Low-fat dairy products. Avoid raw meat and unpasteurized juice, milk, and cheese. You may need to take these actions to prevent or treat trouble pooping: Drink enough fluids to keep  your pee (urine) pale yellow. Eat foods that are high in fiber. These include beans, whole grains, and fresh fruits and vegetables. Limit foods that are high in fat and sugar. These include fried or sweet foods. Activity Exercise only as told by your doctor. Most people can do their usual exercise during pregnancy. Try to exercise for 30 minutes at least 5 days a week. Stop exercising if you have pain or cramps in your belly or lower back. Do not exercise if it is too hot or too humid, or if you are in a place of great height (high altitude). Avoid heavy lifting. If you choose to, you may have sex unless your doctor tells you not to. Relieving pain and discomfort Wear a good support bra if your breasts are sore. Take warm water baths (sitz baths) to soothe pain or discomfort caused by hemorrhoids. Use hemorrhoid cream if your doctor approves. Rest with your legs raised (elevated) if you have leg cramps or low back pain. If you develop bulging veins in your legs: Wear support hose as told by your doctor. Raise your feet for 15 minutes, 3-4 times a day. Limit salt in your food. Safety Wear your seat belt at all times when you are in a car. Talk with your doctor if someone is hurting you or yelling at you a lot. Lifestyle Do not use hot tubs, steam rooms, or saunas. Do not douche. Do not use tampons or scented sanitary pads. Avoid cat litter boxes and soil used by cats. These carry germs that can harm your baby  and can cause a loss of your baby by miscarriage or stillbirth. Do not use herbal medicines, illegal drugs, or medicines that are not approved by your doctor. Do not drink alcohol. Do not smoke or use any products that contain nicotine or tobacco. If you need help quitting, ask your doctor. General instructions Keep all follow-up visits. This is important. Ask your doctor about local prenatal classes. Ask your doctor about the right foods to eat or for help finding a  counselor. Where to find more information American Pregnancy Association: americanpregnancy.org [employer]: www.acog.org Office on [employer]: KeywordPortfolios.com.br Contact a doctor if: You have a headache that does not go away when you take medicine. You have changes in how you see, or you see spots in front of your eyes. You have mild cramps, pressure, or pain in your lower belly. You continue to feel like you may vomit (nauseous), you vomit, or you have watery poop (diarrhea). You have bad-smelling fluid coming from your vagina. You have pain when you pee or your pee smells bad. You have very bad swelling of your face, hands, ankles, feet, or legs. You have a fever. Get help right away if: You are leaking fluid from your vagina. You have spotting or bleeding from your vagina. You have very bad belly cramping or pain. You have trouble breathing. You have chest pain. You faint. You have not felt your baby move for the time period told by your doctor. You have new or increased pain, swelling, or redness in an arm or leg. Summary The second trimester of pregnancy is from week 13 through week 27 (months 4 through 6). Eat healthy meals. Exercise as told by your doctor. Most people can do their usual exercise during pregnancy. Do not use herbal medicines, illegal drugs, or medicines that are not approved by your doctor. Do not drink alcohol. Call your doctor if you get sick or if you notice anything unusual about your pregnancy. This information is not intended to replace advice given to you by your health care provider. Make sure you discuss any questions you have with your health care provider. Document Revised: 02/16/2020 Document Reviewed: 12/23/2019 Elsevier Patient Education  [place].

## 2022-02-26 ENCOUNTER — Encounter: Payer: Medicaid Other | Admitting: Obstetrics and Gynecology

## 2022-02-26 ENCOUNTER — Other Ambulatory Visit: Payer: Medicaid Other

## 2022-03-06 ENCOUNTER — Encounter: Payer: Self-pay | Admitting: Obstetrics and Gynecology

## 2022-03-06 ENCOUNTER — Ambulatory Visit (INDEPENDENT_AMBULATORY_CARE_PROVIDER_SITE_OTHER): Payer: Medicaid Other

## 2022-03-06 ENCOUNTER — Other Ambulatory Visit: Payer: Self-pay | Admitting: Obstetrics and Gynecology

## 2022-03-06 ENCOUNTER — Ambulatory Visit (INDEPENDENT_AMBULATORY_CARE_PROVIDER_SITE_OTHER): Payer: Medicaid Other | Admitting: Obstetrics and Gynecology

## 2022-03-06 VITALS — BP 125/70 | HR 106 | Wt 312.7 lb

## 2022-03-06 DIAGNOSIS — Z8571 Personal history of Hodgkin lymphoma: Secondary | ICD-10-CM

## 2022-03-06 DIAGNOSIS — O30042 Twin pregnancy, dichorionic/diamniotic, second trimester: Secondary | ICD-10-CM | POA: Diagnosis not present

## 2022-03-06 DIAGNOSIS — Z3A18 18 weeks gestation of pregnancy: Secondary | ICD-10-CM

## 2022-03-06 DIAGNOSIS — O0992 Supervision of high risk pregnancy, unspecified, second trimester: Secondary | ICD-10-CM

## 2022-03-06 DIAGNOSIS — Z3A15 15 weeks gestation of pregnancy: Secondary | ICD-10-CM

## 2022-03-06 DIAGNOSIS — O10912 Unspecified pre-existing hypertension complicating pregnancy, second trimester: Secondary | ICD-10-CM

## 2022-03-06 DIAGNOSIS — O99210 Obesity complicating pregnancy, unspecified trimester: Secondary | ICD-10-CM

## 2022-03-06 DIAGNOSIS — O09891 Supervision of other high risk pregnancies, first trimester: Secondary | ICD-10-CM

## 2022-03-06 LAB — POCT URINALYSIS DIPSTICK OB
Bilirubin, UA: NEGATIVE
Blood, UA: NEGATIVE
Glucose, UA: NEGATIVE
Ketones, UA: NEGATIVE
Leukocytes, UA: NEGATIVE
Nitrite, UA: NEGATIVE
POC,PROTEIN,UA: NEGATIVE
Spec Grav, UA: 1.025 (ref 1.010–1.025)
Urobilinogen, UA: 0.2 E.U./dL
pH, UA: 6 (ref 5.0–8.0)

## 2022-03-06 NOTE — Progress Notes (Signed)
ROB: She is doing well. Two weeks ago had one episode of feeling dizzy saw" flashes of light" resolved on its own and only lasted 1 day. No other concern or issue at this time.

## 2022-03-06 NOTE — Progress Notes (Signed)
ROB: Notes issues with an episode of "flashing lights" and dizziness several weeks ago. Occurred one time. Not associated with a headache or blurred vision. Advisd on eye exam as she is overdue.  Continue to monitor symptoms as patient also with newly dx cHTN this pregnancy (currently no meds). S/p anatomy scan, incomplete (also difficulty due to body habitus), will refer to MFM. Discussed practice changes with merger, patient ok to meet midwifery service. Will schedule. RTC in 4 weeks.

## 2022-03-07 ENCOUNTER — Encounter: Payer: Self-pay | Admitting: Obstetrics and Gynecology

## 2022-03-12 ENCOUNTER — Other Ambulatory Visit: Payer: Self-pay

## 2022-03-20 ENCOUNTER — Other Ambulatory Visit: Payer: Medicaid Other

## 2022-04-05 ENCOUNTER — Ambulatory Visit (INDEPENDENT_AMBULATORY_CARE_PROVIDER_SITE_OTHER): Payer: Medicaid Other | Admitting: Obstetrics

## 2022-04-05 ENCOUNTER — Encounter: Payer: Self-pay | Admitting: Obstetrics

## 2022-04-05 VITALS — BP 126/79 | HR 108 | Wt 316.0 lb

## 2022-04-05 DIAGNOSIS — Z3A22 22 weeks gestation of pregnancy: Secondary | ICD-10-CM

## 2022-04-05 LAB — POCT URINALYSIS DIPSTICK OB
Appearance: NEGATIVE
Bilirubin, UA: NEGATIVE
Blood, UA: NEGATIVE
Glucose, UA: NEGATIVE
Ketones, UA: NEGATIVE
Leukocytes, UA: NEGATIVE
Nitrite, UA: NEGATIVE
POC,PROTEIN,UA: NEGATIVE
Spec Grav, UA: 1.01 (ref 1.010–1.025)
Urobilinogen, UA: 0.2 E.U./dL
pH, UA: 6.5 (ref 5.0–8.0)

## 2022-04-05 NOTE — Progress Notes (Signed)
ROB at [redacted]w[redacted]d Both babies are active. Having some BH. Denies LOF and vaginal bleeding. Questions answered regarding delayed cord clamping and alternate drink for 1-hour glucose. Growth [place]ore[place]cheduled for 04/10/22. RTC in 4 weeks.   MLloyd Huger CNM

## 2022-04-10 ENCOUNTER — Encounter: Payer: Self-pay | Admitting: *Deleted

## 2022-04-10 ENCOUNTER — Other Ambulatory Visit: Payer: Self-pay | Admitting: *Deleted

## 2022-04-10 ENCOUNTER — Ambulatory Visit: Payer: Medicaid Other | Attending: Obstetrics and Gynecology

## 2022-04-10 ENCOUNTER — Ambulatory Visit: Payer: Medicaid Other | Admitting: *Deleted

## 2022-04-10 ENCOUNTER — Ambulatory Visit: Payer: Medicaid Other | Attending: Obstetrics and Gynecology | Admitting: Obstetrics and Gynecology

## 2022-04-10 DIAGNOSIS — O30042 Twin pregnancy, dichorionic/diamniotic, second trimester: Secondary | ICD-10-CM | POA: Diagnosis not present

## 2022-04-10 DIAGNOSIS — Z3A23 23 weeks gestation of pregnancy: Secondary | ICD-10-CM

## 2022-04-10 DIAGNOSIS — O09892 Supervision of other high risk pregnancies, second trimester: Secondary | ICD-10-CM

## 2022-04-10 DIAGNOSIS — O10912 Unspecified pre-existing hypertension complicating pregnancy, second trimester: Secondary | ICD-10-CM | POA: Diagnosis present

## 2022-04-10 DIAGNOSIS — Z3A18 18 weeks gestation of pregnancy: Secondary | ICD-10-CM | POA: Insufficient documentation

## 2022-04-10 DIAGNOSIS — O99212 Obesity complicating pregnancy, second trimester: Secondary | ICD-10-CM

## 2022-04-10 DIAGNOSIS — O99210 Obesity complicating pregnancy, unspecified trimester: Secondary | ICD-10-CM | POA: Diagnosis not present

## 2022-04-10 DIAGNOSIS — O10012 Pre-existing essential hypertension complicating pregnancy, second trimester: Secondary | ICD-10-CM

## 2022-04-10 DIAGNOSIS — E669 Obesity, unspecified: Secondary | ICD-10-CM

## 2022-04-10 DIAGNOSIS — Z362 Encounter for other antenatal screening follow-up: Secondary | ICD-10-CM

## 2022-04-10 NOTE — Progress Notes (Signed)
Maternal-Fetal Medicine  Name: Miranda Villegas DOB: 1990/12/09 MRN: [id number] Referring Provider: Rubie Maid, MD  I had the pleasure of seeing Miranda Villegas today at the [facility]. She is G2 P1001 at 23w 1d gestation with twin pregnancy and is here for fetal anatomy scan. This is a natural conception.  Dichorionic-diamniotic twin pregnancy was confirmed by first trimester ultrasound performed at [redacted] weeks gestation.  Fetal anatomical survey was performed at your office at [redacted] weeks gestation.  Patient had opted not to screen for fetal aneuploidies.  Past medical history significant for Hodgkin's lymphoma and she is in remission. Patient has a diagnosis of chronic hypertension but her blood pressures have been within normal range.  She does not take antihypertensives.  She takes low-dose aspirin prophylaxis. She has class 3 obesity.  Obstetric history significant for a term vaginal delivery in 2022 of a female infant weighing 7 pounds and 10 ounces at birth.  Ultrasound Dichorionic-diamniotic twin pregnancy.  Twin A: Maternal left, cephalic presentation, posterior placenta, female fetus.  Amniotic fluid is normal and good fetal activity seen.  Fetal biometry is consistent with the previously established dates.  Single umbilical artery is seen.  No other markers of aneuploidies or fetal structural defects are seen.  Twin B: Maternal right, transverse lie and head to maternal right, posterior placenta, female fetus.  Amniotic fluid is normal and good fetal activity seen.  Fetal biometry is consistent with the previously established dates.  No markers of aneuploidies or fetal structural defects are seen.  Growth discordancy: 9% (normal).  Fetal anatomical survey could not be completed because of maternal body habitus and fetal position.  As maternal obesity imposes limitations on the resolution of images fetal anomalies may be missed.  Twin pregnancy I explained the significance  of chorionicity. I explained the possible complications associated with twin pregnancy that include preterm labor/delivery (most common)and  fetal growth restriction of one or both twins.  Gestational diabetes and gestational hypertension/preeclampsia are more common.  I discussed the benefit of low-dose aspirin prophylaxis and encouraged her to continue aspirin till delivery.  Mode of delivery is based on the presentations.  If both have Vx/Vx or Vx/non-vertex presentations, vaginal delivery may be considered. In Vx/non-vx, vaginal delivery followed by internal podalic version of second twin will achieve vaginal delivery. In non-vx first twin presentation, cesarean delivery will be performed.  I discussed ultrasound protocol of monitoring twin pregnancy Single umbilical artery (SUA) SUA is seen in about 0.25% to 1% of fetuses. In the absence of other anomalies, the risk for aneuploidies is not increased. However, ultrasound has limitations in detecting fetal anomalies that may be missed on target at anatomical survey.  SUA can be associated with fetal growth restriction. The association of SUA with cardiac anomalies is not conclusively proven.    I discussed the significance and limitations of cell free fetal DNA screening in detecting aneuploidies.  Patient opted not to screen for fetal aneuploidies.   Recommendations -An appointment was made for her to return in 4 weeks for completion of fetal anatomy if feasible. -Serial fetal growth assessments every 4 weeks. -Weekly antenatal testing at 26 and 37 weeks. -Delivery at 38 weeks.   Thank you for consultation.  If you have any questions or concerns, please contact me the [facility].  Consultation including face-to-face (more than 50%) counseling 30 minutes.

## 2022-04-30 ENCOUNTER — Ambulatory Visit: Payer: Medicaid Other

## 2022-05-07 ENCOUNTER — Ambulatory Visit (INDEPENDENT_AMBULATORY_CARE_PROVIDER_SITE_OTHER): Payer: Medicaid Other | Admitting: Certified Nurse Midwife

## 2022-05-07 ENCOUNTER — Encounter: Payer: Self-pay | Admitting: Obstetrics and Gynecology

## 2022-05-07 VITALS — BP 140/91 | HR 120 | Wt 322.3 lb

## 2022-05-07 DIAGNOSIS — Z3A27 27 weeks gestation of pregnancy: Secondary | ICD-10-CM

## 2022-05-07 DIAGNOSIS — O30042 Twin pregnancy, dichorionic/diamniotic, second trimester: Secondary | ICD-10-CM

## 2022-05-07 NOTE — Progress Notes (Signed)
ROB doing well, feeling lots of movement. Pt has u/s with MFM on the 18ths . Discussed glucose screening 1 wk . Information sheet given on eating prior to testing. She verbalizes and agrees. Follow up 1 wk with Missy for glucose screen and ROB.   Philip Aspen, CNM

## 2022-05-10 ENCOUNTER — Other Ambulatory Visit: Payer: Self-pay | Admitting: *Deleted

## 2022-05-10 ENCOUNTER — Ambulatory Visit: Payer: Medicaid Other | Attending: Obstetrics and Gynecology

## 2022-05-10 ENCOUNTER — Ambulatory Visit: Payer: Medicaid Other | Admitting: *Deleted

## 2022-05-10 VITALS — BP 122/72 | HR 119

## 2022-05-10 DIAGNOSIS — E669 Obesity, unspecified: Secondary | ICD-10-CM | POA: Diagnosis not present

## 2022-05-10 DIAGNOSIS — O30042 Twin pregnancy, dichorionic/diamniotic, second trimester: Secondary | ICD-10-CM | POA: Insufficient documentation

## 2022-05-10 DIAGNOSIS — Z362 Encounter for other antenatal screening follow-up: Secondary | ICD-10-CM

## 2022-05-10 DIAGNOSIS — Z3A27 27 weeks gestation of pregnancy: Secondary | ICD-10-CM | POA: Diagnosis not present

## 2022-05-10 DIAGNOSIS — O10012 Pre-existing essential hypertension complicating pregnancy, second trimester: Secondary | ICD-10-CM | POA: Diagnosis not present

## 2022-05-10 DIAGNOSIS — O358XX1 Maternal care for other (suspected) fetal abnormality and damage, fetus 1: Secondary | ICD-10-CM

## 2022-05-10 DIAGNOSIS — O10912 Unspecified pre-existing hypertension complicating pregnancy, second trimester: Secondary | ICD-10-CM | POA: Insufficient documentation

## 2022-05-10 DIAGNOSIS — O99212 Obesity complicating pregnancy, second trimester: Secondary | ICD-10-CM | POA: Diagnosis not present

## 2022-05-13 ENCOUNTER — Other Ambulatory Visit: Payer: Medicaid Other

## 2022-05-13 ENCOUNTER — Ambulatory Visit (INDEPENDENT_AMBULATORY_CARE_PROVIDER_SITE_OTHER): Payer: Medicaid Other | Admitting: Obstetrics

## 2022-05-13 ENCOUNTER — Encounter: Payer: Self-pay | Admitting: Obstetrics

## 2022-05-13 VITALS — BP 137/84 | HR 111 | Wt 326.0 lb

## 2022-05-13 DIAGNOSIS — Z3A27 27 weeks gestation of pregnancy: Secondary | ICD-10-CM | POA: Diagnosis not present

## 2022-05-13 DIAGNOSIS — Z113 Encounter for screening for infections with a predominantly sexual mode of transmission: Secondary | ICD-10-CM

## 2022-05-13 DIAGNOSIS — Z131 Encounter for screening for diabetes mellitus: Secondary | ICD-10-CM

## 2022-05-13 DIAGNOSIS — Z130 Encounter for screening for diseases of the blood and blood-forming organs and certain disorders involving the immune mechanism: Secondary | ICD-10-CM

## 2022-05-13 DIAGNOSIS — Z3482 Encounter for supervision of other normal pregnancy, second trimester: Secondary | ICD-10-CM

## 2022-05-13 DIAGNOSIS — R399 Unspecified symptoms and signs involving the genitourinary system: Secondary | ICD-10-CM | POA: Diagnosis not present

## 2022-05-13 LAB — POCT URINALYSIS DIPSTICK OB
Bilirubin, UA: NEGATIVE
Blood, UA: NEGATIVE
Glucose, UA: NEGATIVE
Ketones, UA: NEGATIVE
Leukocytes, UA: NEGATIVE
POC,PROTEIN,UA: NEGATIVE
Spec Grav, UA: 1.015 (ref 1.010–1.025)
Urobilinogen, UA: 0.2 E.U./dL
pH, UA: 6.5 (ref 5.0–8.0)

## 2022-05-13 NOTE — Progress Notes (Signed)
ROB at [redacted]w[redacted]d Active babies. Shekira has occasional BH ctx that resolve with hydration. Discussed making a birth plan and also recommendation for c/s if twin A remains breech. Plans to breastfeed. Had some difficulty with milk production d/t breast surgery for CA, but will supplement if needed. Husband will likely get a vasectomy. Will review other birth control options. +blood in urine, no other UTI symptoms. UC sent. RSB, BTC today. 1-hour glucose, RPR, CBC today. Future MFM visits already scheduled. RTC in 2 weeks.  MLloyd Huger CNM

## 2022-05-14 ENCOUNTER — Encounter: Payer: Self-pay | Admitting: Obstetrics and Gynecology

## 2022-05-14 ENCOUNTER — Encounter: Payer: Medicaid Other | Admitting: Obstetrics and Gynecology

## 2022-05-14 ENCOUNTER — Other Ambulatory Visit: Payer: Medicaid Other

## 2022-05-14 LAB — CBC
Hematocrit: 34.4 % (ref 34.0–46.6)
Hemoglobin: 11.2 g/dL (ref 11.1–15.9)
MCH: 27.7 pg (ref 26.6–33.0)
MCHC: 32.6 g/dL (ref 31.5–35.7)
MCV: 85 fL (ref 79–97)
Platelets: 245 10*3/uL (ref 150–450)
RBC: 4.05 x10E6/uL (ref 3.77–5.28)
RDW: 14.3 % (ref 11.7–15.4)
WBC: 9.2 10*3/uL (ref 3.4–10.8)

## 2022-05-14 LAB — GLUCOSE, 1 HOUR GESTATIONAL: Gestational Diabetes Screen: 102 mg/dL (ref 70–139)

## 2022-05-14 LAB — RPR: RPR Ser Ql: NONREACTIVE

## 2022-05-15 LAB — URINE CULTURE

## 2022-05-16 ENCOUNTER — Ambulatory Visit (INDEPENDENT_AMBULATORY_CARE_PROVIDER_SITE_OTHER): Payer: Medicaid Other | Admitting: Obstetrics and Gynecology

## 2022-05-16 ENCOUNTER — Telehealth: Payer: Self-pay | Admitting: Obstetrics

## 2022-05-16 ENCOUNTER — Encounter: Payer: Self-pay | Admitting: Obstetrics and Gynecology

## 2022-05-16 ENCOUNTER — Other Ambulatory Visit (HOSPITAL_COMMUNITY)
Admission: RE | Admit: 2022-05-16 | Discharge: 2022-05-16 | Disposition: A | Discharge status: Home / Self Care | Payer: Medicaid Other | Source: Ambulatory Visit | Attending: Obstetrics and Gynecology | Admitting: Obstetrics and Gynecology

## 2022-05-16 VITALS — BP 111/75 | HR 134 | Wt 325.5 lb

## 2022-05-16 DIAGNOSIS — N898 Other specified noninflammatory disorders of vagina: Secondary | ICD-10-CM

## 2022-05-16 DIAGNOSIS — Z3A28 28 weeks gestation of pregnancy: Secondary | ICD-10-CM

## 2022-05-16 DIAGNOSIS — O30043 Twin pregnancy, dichorionic/diamniotic, third trimester: Secondary | ICD-10-CM

## 2022-05-16 LAB — POCT URINALYSIS DIPSTICK OB
Bilirubin, UA: NEGATIVE
Glucose, UA: NEGATIVE
Ketones, UA: NEGATIVE
Leukocytes, UA: NEGATIVE
Nitrite, UA: NEGATIVE
Spec Grav, UA: 1.015 (ref 1.010–1.025)
Urobilinogen, UA: 0.2 E.U./dL
pH, UA: 7 (ref 5.0–8.0)

## 2022-05-16 NOTE — Progress Notes (Signed)
ROB: She is here today for evaluation of blood in her urine. She reports that when she urinates it is red/pink, when she wipes it is bright red blood. She has some discomfort or dull pain.

## 2022-05-16 NOTE — Telephone Encounter (Signed)
Patient called and is concerned. Pt is [redacted] weeks pregnant with twins and states she is bleeding bright red blood. Pt was transferred to [place].

## 2022-05-16 NOTE — Telephone Encounter (Signed)
Patient called with concerns about bright red bleeding when she urinates or wipes. She denies heavy bleeding or cramping.  Spoke with Dr. Marcelline Mates and she advised that patient come into be seen, because she has twin pregnancy. Called patient to inform her. She verbalized understanding.

## 2022-05-16 NOTE — Progress Notes (Signed)
   Obstetric Prenatal Visit  Miranda Villegas is a 31 y.o. G2P1001 at 10w2dwith di/di twin pregnancy here for problem visit.  She continues to reports backache, bleeding and Some pelvic cramping.  She also continues to endorse UTI symptoms although last week her urine dipstick and culture returned negative.  Continues to see blood with wiping.  She reports fetal movement x 2. She denies contractions, or loss of fluid. See flow sheet for details.   Review of Systems - Negative except what is noted in HPI.    Physical Exam:  BP 111/75   Pulse (!) 134   Wt (!) 325 lb 8 oz (147.6 kg)   LMP 10/30/2021 (Exact Date)   BMI 46.70 kg/m  General appearance: alert and no distress Abdomen: soft, non-tender; bowel sounds normal; no masses,  no organomegaly, gravid. FH 32 cm. FHT 167/172 Pelvic: External genitalia normal.  Vagina with scant thin white discharge, no visible blood.  Cervix appears multiparous but closed. Extremities: extremities normal, atraumatic, no cyanosis or edema    Labs:  Results for orders placed or performed in visit on 05/16/22  POC Urinalysis Dipstick OB  Result Value Ref Range   Color, UA     Clarity, UA     Glucose, UA Negative Negative   Bilirubin, UA Negative    Ketones, UA Negative    Spec Grav, UA 1.015 1.010 - 1.025   Blood, UA Large    pH, UA 7.0 5.0 - 8.0   POC,PROTEIN,UA Small (1+) Negative, Trace, Small (1+), Moderate (2+), Large (3+), 4+   Urobilinogen, UA 0.2 0.2 or 1.0 E.U./dL   Nitrite, UA Negative    Leukocytes, UA Negative Negative   Appearance     Odor       A/P: Pregnancy at 231w2d Doing well.   1.  Routine prenatal care: Currently overall doing well with di/di twin pregnancy.  Chronic hypertension stable on no meds. 2.  Hematuria: Unclear cause, last urine culture was negative.  I discussed that it is possible that it may be pregnancy related due to fetal positioning.  Patient denies any significant pelvic pain indicative of urinary  stones.  Is currently hydrating adequately, drinking 1 gallon of water a day.  No blood noted in vaginal vault today, so ruled out as possible source.  Scant vaginal discharge in vault, NuSwab collected.  If blood in the urine continues, may need to consider referral to urology. 3.  Maternal tachycardia: Patient's tachycardia has been noted to be increasing over the past several visits.  Patient currently asymptomatic.  If remains persistent above 130s, will likely need referral to cardiology or at very least EKG. TSH performed earlier in the pregnancy was normal.  RTC for routine scheduled OB visit.   ChRubie MaidMD [facility]

## 2022-05-16 NOTE — Telephone Encounter (Signed)
Unable to lvm to vm about labs

## 2022-05-17 ENCOUNTER — Encounter: Payer: Self-pay | Admitting: Obstetrics and Gynecology

## 2022-05-17 LAB — CERVICOVAGINAL ANCILLARY ONLY
Bacterial Vaginitis (gardnerella): NEGATIVE
Candida Glabrata: NEGATIVE
Candida Vaginitis: POSITIVE — AB
Chlamydia: NEGATIVE
Comment: NEGATIVE
Comment: NEGATIVE
Comment: NEGATIVE
Comment: NEGATIVE
Comment: NEGATIVE
Comment: NORMAL
Neisseria Gonorrhea: NEGATIVE
Trichomonas: NEGATIVE

## 2022-05-28 ENCOUNTER — Ambulatory Visit (INDEPENDENT_AMBULATORY_CARE_PROVIDER_SITE_OTHER): Payer: Medicaid Other | Admitting: Obstetrics

## 2022-05-28 ENCOUNTER — Encounter: Payer: Self-pay | Admitting: Obstetrics

## 2022-05-28 VITALS — BP 147/77 | HR 115 | Wt 331.1 lb

## 2022-05-28 DIAGNOSIS — O163 Unspecified maternal hypertension, third trimester: Secondary | ICD-10-CM

## 2022-05-28 DIAGNOSIS — Z3A30 30 weeks gestation of pregnancy: Secondary | ICD-10-CM

## 2022-05-28 NOTE — Progress Notes (Signed)
ROB at [redacted]w[redacted]d Active babies. Tired and having some BH ctx but feeling well overall. Denies LOF and vaginal bleeding. MFM UKoreascheduled for next week. Discussed POC if baby A remains breech. Reviewed adequate hydration and protein intake. Elevated initial BP reading. Having occasional headaches that resolve with Tylenol. Preeclampsia labs today. Reviewed s/s of PTL and when to go to the hospital. RTC in 2 weeks.  MLloyd Huger CNM

## 2022-05-29 LAB — COMPREHENSIVE METABOLIC PANEL
ALT: 11 IU/L (ref 0–32)
AST: 9 IU/L (ref 0–40)
Albumin/Globulin Ratio: 1.6 (ref 1.2–2.2)
Albumin: 3.6 g/dL — ABNORMAL LOW (ref 4.0–5.0)
Alkaline Phosphatase: 96 IU/L (ref 44–121)
BUN/Creatinine Ratio: 14 (ref 9–23)
BUN: 8 mg/dL (ref 6–20)
Bilirubin Total: <0.2 mg/dL (ref 0.0–1.2)
CO2: 19 mmol/L — ABNORMAL LOW (ref 20–29)
Calcium: 9.2 mg/dL (ref 8.7–10.2)
Chloride: 102 mmol/L (ref 96–106)
Creatinine, Ser: 0.58 mg/dL (ref 0.57–1.00)
Globulin, Total: 2.3 g/dL (ref 1.5–4.5)
Glucose: 149 mg/dL — ABNORMAL HIGH (ref 70–99)
Potassium: 4 mmol/L (ref 3.5–5.2)
Sodium: 137 mmol/L (ref 134–144)
Total Protein: 5.9 g/dL — ABNORMAL LOW (ref 6.0–8.5)
eGFR: 126 mL/min/{1.73_m2} (ref >59)

## 2022-05-29 LAB — PROTEIN / CREATININE RATIO, URINE
Creatinine, Urine: 125.9 mg/dL
Protein, Ur: 13.6 mg/dL
Protein/Creat Ratio: 108 mg/g creat (ref 0–200)

## 2022-05-29 LAB — CBC
Hematocrit: 33.9 % — ABNORMAL LOW (ref 34.0–46.6)
Hemoglobin: 10.8 g/dL — ABNORMAL LOW (ref 11.1–15.9)
MCH: 27.2 pg (ref 26.6–33.0)
MCHC: 31.9 g/dL (ref 31.5–35.7)
MCV: 85 fL (ref 79–97)
Platelets: 230 10*3/uL (ref 150–450)
RBC: 3.97 x10E6/uL (ref 3.77–5.28)
RDW: 14.3 % (ref 11.7–15.4)
WBC: 8.3 10*3/uL (ref 3.4–10.8)

## 2022-05-30 ENCOUNTER — Encounter: Payer: Self-pay | Admitting: Obstetrics

## 2022-06-07 ENCOUNTER — Other Ambulatory Visit: Payer: Self-pay | Admitting: *Deleted

## 2022-06-07 ENCOUNTER — Ambulatory Visit: Payer: Medicaid Other | Admitting: *Deleted

## 2022-06-07 ENCOUNTER — Ambulatory Visit: Payer: Medicaid Other | Attending: Obstetrics and Gynecology

## 2022-06-07 VITALS — BP 135/68 | HR 109

## 2022-06-07 DIAGNOSIS — O30043 Twin pregnancy, dichorionic/diamniotic, third trimester: Secondary | ICD-10-CM | POA: Diagnosis not present

## 2022-06-07 DIAGNOSIS — E669 Obesity, unspecified: Secondary | ICD-10-CM

## 2022-06-07 DIAGNOSIS — Z3689 Encounter for other specified antenatal screening: Secondary | ICD-10-CM

## 2022-06-07 DIAGNOSIS — O99213 Obesity complicating pregnancy, third trimester: Secondary | ICD-10-CM

## 2022-06-07 DIAGNOSIS — O10913 Unspecified pre-existing hypertension complicating pregnancy, third trimester: Secondary | ICD-10-CM

## 2022-06-07 DIAGNOSIS — O30042 Twin pregnancy, dichorionic/diamniotic, second trimester: Secondary | ICD-10-CM | POA: Diagnosis present

## 2022-06-07 DIAGNOSIS — Z3A31 31 weeks gestation of pregnancy: Secondary | ICD-10-CM | POA: Diagnosis not present

## 2022-06-11 ENCOUNTER — Ambulatory Visit (INDEPENDENT_AMBULATORY_CARE_PROVIDER_SITE_OTHER): Payer: Medicaid Other | Admitting: Certified Nurse Midwife

## 2022-06-11 VITALS — BP 142/88 | HR 114 | Wt 332.9 lb

## 2022-06-11 DIAGNOSIS — O30043 Twin pregnancy, dichorionic/diamniotic, third trimester: Secondary | ICD-10-CM

## 2022-06-11 NOTE — Progress Notes (Signed)
ROB doing well, feeling good movement. Has return MFM appointment scheduled in 2 wks. Reviewed 2 questions regarding birth plan. No other concerns. Follow up 2 wks for ROB.   Philip Aspen, CNM

## 2022-06-26 ENCOUNTER — Encounter: Payer: Self-pay | Admitting: Obstetrics and Gynecology

## 2022-06-26 ENCOUNTER — Ambulatory Visit (INDEPENDENT_AMBULATORY_CARE_PROVIDER_SITE_OTHER): Payer: Medicaid Other | Admitting: Obstetrics and Gynecology

## 2022-06-26 VITALS — BP 131/88 | HR 103 | Wt 338.3 lb

## 2022-06-26 DIAGNOSIS — O0993 Supervision of high risk pregnancy, unspecified, third trimester: Secondary | ICD-10-CM

## 2022-06-26 DIAGNOSIS — E669 Obesity, unspecified: Secondary | ICD-10-CM

## 2022-06-26 DIAGNOSIS — O10919 Unspecified pre-existing hypertension complicating pregnancy, unspecified trimester: Secondary | ICD-10-CM

## 2022-06-26 DIAGNOSIS — Z3A34 34 weeks gestation of pregnancy: Secondary | ICD-10-CM

## 2022-06-26 DIAGNOSIS — O30041 Twin pregnancy, dichorionic/diamniotic, first trimester: Secondary | ICD-10-CM

## 2022-06-26 DIAGNOSIS — O99210 Obesity complicating pregnancy, unspecified trimester: Secondary | ICD-10-CM

## 2022-06-26 LAB — POCT URINALYSIS DIPSTICK OB
Bilirubin, UA: NEGATIVE
Blood, UA: NEGATIVE
Glucose, UA: NEGATIVE
Ketones, UA: NEGATIVE
Leukocytes, UA: NEGATIVE
Nitrite, UA: NEGATIVE
Spec Grav, UA: 1.015 (ref 1.010–1.025)
Urobilinogen, UA: 0.2 E.U./dL
pH, UA: 6.5 (ref 5.0–8.0)

## 2022-06-26 NOTE — Progress Notes (Signed)
ROB: Doing well today, she has good fetal movement. No new concerns today.

## 2022-06-26 NOTE — Progress Notes (Signed)
ROB: Patient without major complaints today. Notes good fetal movement x 2.  Has questions about circumcision for female infants. Also has further questions regarding mode of delivery based on fetal positioning.  All questions answered. . Initial BP more elevated today but repeat normal. Still only small amount of protein today.  No other symptoms. Reviewed warning signs of pre-eclampsia. Discussed timing of delivery, MFM recommends 38 weeks however if BPs continue to increase, would consider earlier (37 weeks).  Has next [place] tomorrow with MFM (likley with BPP). For NST next week. Consider 3rd trimester cultures next visit.

## 2022-06-27 ENCOUNTER — Ambulatory Visit: Payer: Medicaid Other | Attending: Obstetrics and Gynecology

## 2022-06-27 ENCOUNTER — Ambulatory Visit: Payer: Medicaid Other | Admitting: *Deleted

## 2022-06-27 VITALS — BP 150/82 | HR 107

## 2022-06-27 DIAGNOSIS — Z3A34 34 weeks gestation of pregnancy: Secondary | ICD-10-CM

## 2022-06-27 DIAGNOSIS — O10013 Pre-existing essential hypertension complicating pregnancy, third trimester: Secondary | ICD-10-CM

## 2022-06-27 DIAGNOSIS — O30043 Twin pregnancy, dichorionic/diamniotic, third trimester: Secondary | ICD-10-CM | POA: Diagnosis not present

## 2022-06-27 DIAGNOSIS — O10913 Unspecified pre-existing hypertension complicating pregnancy, third trimester: Secondary | ICD-10-CM

## 2022-06-27 DIAGNOSIS — E669 Obesity, unspecified: Secondary | ICD-10-CM

## 2022-06-27 DIAGNOSIS — O99213 Obesity complicating pregnancy, third trimester: Secondary | ICD-10-CM | POA: Diagnosis not present

## 2022-06-28 ENCOUNTER — Other Ambulatory Visit: Payer: Self-pay

## 2022-06-28 ENCOUNTER — Observation Stay
Admission: EM | Admit: 2022-06-28 | Discharge: 2022-06-28 | Disposition: A | Discharge status: Home / Self Care | Payer: Medicaid Other | Attending: Certified Nurse Midwife | Admitting: Certified Nurse Midwife

## 2022-06-28 ENCOUNTER — Encounter: Payer: Self-pay | Admitting: Obstetrics and Gynecology

## 2022-06-28 DIAGNOSIS — O163 Unspecified maternal hypertension, third trimester: Secondary | ICD-10-CM | POA: Diagnosis not present

## 2022-06-28 DIAGNOSIS — R102 Pelvic and perineal pain: Secondary | ICD-10-CM | POA: Diagnosis not present

## 2022-06-28 DIAGNOSIS — Z79899 Other long term (current) drug therapy: Secondary | ICD-10-CM | POA: Diagnosis not present

## 2022-06-28 DIAGNOSIS — Z7982 Long term (current) use of aspirin: Secondary | ICD-10-CM | POA: Diagnosis not present

## 2022-06-28 DIAGNOSIS — Z3A34 34 weeks gestation of pregnancy: Secondary | ICD-10-CM

## 2022-06-28 DIAGNOSIS — O4703 False labor before 37 completed weeks of gestation, third trimester: Principal | ICD-10-CM | POA: Insufficient documentation

## 2022-06-28 DIAGNOSIS — O26893 Other specified pregnancy related conditions, third trimester: Secondary | ICD-10-CM | POA: Diagnosis not present

## 2022-06-28 LAB — URINALYSIS, COMPLETE (UACMP) WITH MICROSCOPIC
Bilirubin Urine: NEGATIVE
Glucose, UA: NEGATIVE mg/dL
Hgb urine dipstick: NEGATIVE
Ketones, ur: 80 mg/dL — AB
Leukocytes,Ua: NEGATIVE
Nitrite: NEGATIVE
Protein, ur: 30 mg/dL — AB
Specific Gravity, Urine: 1.019 (ref 1.005–1.030)
pH: 6 (ref 5.0–8.0)

## 2022-06-28 MED ORDER — ACETAMINOPHEN 325 MG PO TABS
650.0000 mg | ORAL_TABLET | Freq: Four times a day (QID) | ORAL | Status: DC | PRN
  Administered 2022-06-28: 650 mg via ORAL
  Filled 2022-06-28: qty 2

## 2022-06-28 MED ORDER — LACTATED RINGERS IV SOLN: 500.0000 mL | INTRAVENOUS | Status: DC | PRN

## 2022-06-28 MED ORDER — LACTATED RINGERS IV BOLUS
1000.0000 mL | Freq: Once | INTRAVENOUS | Status: AC
  Administered 2022-06-28: 1000 mL via INTRAVENOUS

## 2022-06-28 NOTE — OB Triage Note (Signed)
L&D OB Triage Note  SUBJECTIVE Miranda Villegas is a 31 y.o. G2P1001 female at [redacted]w[redacted]d EDD Estimated Date of Delivery: 08/06/22 who presented to triage with complaints of vaginal pelvic pressure and pain. PT also notes that she took her BP at home and it was elevated. She denies headache, visual changes, epigastric pain .   OB History  Gravida Para Term Preterm AB Living  '2 1 1 '$ 0 0 1  SAB IAB Ectopic Multiple Live Births  0 0 0 0 1    # Outcome Date GA Lbr Len/2nd Weight Sex Delivery Anes PTL Lv  2 Current           1 Term 11/14/20 4101w0d3:28 / 00:10 3460 g F Vag-Spont EPI  LIV     Name: Asper,GIRL Sherrilyn     Apgar1: 7  Apgar5: 9    Medications Prior to Admission  Medication Sig Dispense Refill Last Dose   Ascorbic Acid (VITAMIN C PO) Take 125 mg by mouth. Take 2 tablets daily   06/28/2022 at 0900   aspirin EC 81 MG tablet Take 1 tablet (81 mg total) by mouth daily. Take after 12 weeks for prevention of preeclampssia later in pregnancy 300 tablet 2 06/28/2022 at 0900   cholecalciferol (VITAMIN D) 1000 units tablet Take 2,000 Units by mouth daily.   06/28/2022 at 0900   Ferrous Sulfate (IRON) 325 (65 Fe) MG TABS Take 325 mg by mouth daily. Take 1/2 tablets once daily   1083/3/8250t [id number] folic acid (FOLVITE) 1 MG tablet Take 1 tablet (1 mg total) by mouth daily. 30 tablet 10 06/28/2022 at 0900   loratadine (CLARITIN) 10 MG tablet Take 10 mg by mouth daily.   06/28/2022 at 0900   Methylcellulose, Laxative, (CITRUCEL) 500 MG TABS Take by mouth.   06/28/2022 at 0900   Prenatal Vit-Fe Fumarate-FA (MULTIVITAMIN-PRENATAL) 27-0.8 MG TABS tablet Take 1 tablet by mouth daily at 12 noon.   06/28/2022 at 0900     OBJECTIVE  Nursing Evaluation:   BP (!) 145/79   Pulse (!) 101   Temp 98.6 F (37 C) (Oral)   Resp 20   Ht '5\' 10"'$  (1.778 m)   Wt (!) 152 kg   LMP 10/30/2021 (Exact Date)   BMI 48.07 kg/m    Findings:        BP constant to her normal from the office     Irregular mild  contractions that decreased with IV hydration     Pelvic pain with twin pregnancy       NST was performed and has been reviewed by me.  NST INTERPRETATION: Category I  Mode: (P) External Baseline Rate (A): 140 bpm B 150 Variability: Moderate Accelerations: 10 x 10 Decelerations: None     Contraction Frequency (min): ctx x 2  ASSESSMENT Impression:  1.  Pregnancy:  G2P1001 at 3431w3dEDD Estimated Date of Delivery: 08/06/22 2.  Reassuring fetal status x 2 and maternal status 3.  Irregular mild contractions, that improved with IV hydration 4.  Bps are normal for patient , she denies symptoms of pre eclampsia   PLAN 1. Current condition and above findings reviewed.  Reassuring fetal (x2) and maternal condition. Discussed normal plevic pressure, normal musculoskeletal discomfort and self help measures. Offered SVE , pt declines state pressure is mild.  2. Discharge home with standard labor precautions given to return to L&D or call the office for problems. 3. Continue routine prenatal care.  I was present and evaluated this patient in person.   Philip Aspen, CNM

## 2022-07-01 ENCOUNTER — Telehealth: Payer: Self-pay

## 2022-07-01 NOTE — Telephone Encounter (Signed)
Miranda Villegas called triage line about her having high blood pressures, she's currently [redacted] weeks pregnant with twins.

## 2022-07-01 NOTE — Telephone Encounter (Signed)
Called Miranda Villegas and left a voicemail letting her know she needed to come in for a nurse visit so we can see how her blood pressure is.

## 2022-07-02 ENCOUNTER — Inpatient Hospital Stay: Payer: Medicaid Other | Admitting: Anesthesiology

## 2022-07-02 ENCOUNTER — Encounter
Admission: EM | Disposition: A | Discharge status: Home / Self Care | Payer: Self-pay | Source: Home / Self Care | Attending: Obstetrics and Gynecology

## 2022-07-02 ENCOUNTER — Encounter: Payer: Self-pay | Admitting: Obstetrics and Gynecology

## 2022-07-02 ENCOUNTER — Ambulatory Visit: Payer: Medicaid Other

## 2022-07-02 ENCOUNTER — Inpatient Hospital Stay
Admission: EM | Admit: 2022-07-02 | Discharge: 2022-07-07 | DRG: 787 | Disposition: A | Discharge status: Home / Self Care | Payer: Medicaid Other | Attending: Obstetrics and Gynecology | Admitting: Obstetrics and Gynecology

## 2022-07-02 ENCOUNTER — Ambulatory Visit: Payer: Medicaid Other | Attending: Obstetrics

## 2022-07-02 ENCOUNTER — Other Ambulatory Visit: Payer: Self-pay

## 2022-07-02 ENCOUNTER — Ambulatory Visit: Payer: Medicaid Other | Attending: Obstetrics and Gynecology | Admitting: Obstetrics and Gynecology

## 2022-07-02 VITALS — BP 163/89 | HR 98

## 2022-07-02 DIAGNOSIS — O9902 Anemia complicating childbirth: Secondary | ICD-10-CM | POA: Diagnosis not present

## 2022-07-02 DIAGNOSIS — O30043 Twin pregnancy, dichorionic/diamniotic, third trimester: Secondary | ICD-10-CM | POA: Diagnosis present

## 2022-07-02 DIAGNOSIS — O114 Pre-existing hypertension with pre-eclampsia, complicating childbirth: Secondary | ICD-10-CM | POA: Diagnosis not present

## 2022-07-02 DIAGNOSIS — O99214 Obesity complicating childbirth: Secondary | ICD-10-CM | POA: Diagnosis not present

## 2022-07-02 DIAGNOSIS — O1002 Pre-existing essential hypertension complicating childbirth: Secondary | ICD-10-CM | POA: Diagnosis present

## 2022-07-02 DIAGNOSIS — D62 Acute posthemorrhagic anemia: Secondary | ICD-10-CM | POA: Diagnosis not present

## 2022-07-02 DIAGNOSIS — Z3A35 35 weeks gestation of pregnancy: Secondary | ICD-10-CM | POA: Diagnosis not present

## 2022-07-02 DIAGNOSIS — O328XX2 Maternal care for other malpresentation of fetus, fetus 2: Secondary | ICD-10-CM | POA: Diagnosis not present

## 2022-07-02 DIAGNOSIS — O322XX0 Maternal care for transverse and oblique lie, not applicable or unspecified: Secondary | ICD-10-CM | POA: Diagnosis not present

## 2022-07-02 DIAGNOSIS — O113 Pre-existing hypertension with pre-eclampsia, third trimester: Secondary | ICD-10-CM

## 2022-07-02 DIAGNOSIS — O99213 Obesity complicating pregnancy, third trimester: Secondary | ICD-10-CM

## 2022-07-02 DIAGNOSIS — O322XX1 Maternal care for transverse and oblique lie, fetus 1: Secondary | ICD-10-CM | POA: Diagnosis not present

## 2022-07-02 DIAGNOSIS — E669 Obesity, unspecified: Secondary | ICD-10-CM | POA: Diagnosis not present

## 2022-07-02 DIAGNOSIS — Z8571 Personal history of Hodgkin lymphoma: Secondary | ICD-10-CM | POA: Diagnosis not present

## 2022-07-02 DIAGNOSIS — O321XX1 Maternal care for breech presentation, fetus 1: Secondary | ICD-10-CM | POA: Diagnosis present

## 2022-07-02 DIAGNOSIS — O9081 Anemia of the puerperium: Secondary | ICD-10-CM | POA: Diagnosis not present

## 2022-07-02 DIAGNOSIS — E668 Other obesity: Secondary | ICD-10-CM | POA: Diagnosis not present

## 2022-07-02 DIAGNOSIS — O119 Pre-existing hypertension with pre-eclampsia, unspecified trimester: Secondary | ICD-10-CM

## 2022-07-02 DIAGNOSIS — O10913 Unspecified pre-existing hypertension complicating pregnancy, third trimester: Secondary | ICD-10-CM | POA: Insufficient documentation

## 2022-07-02 DIAGNOSIS — O10013 Pre-existing essential hypertension complicating pregnancy, third trimester: Secondary | ICD-10-CM | POA: Diagnosis not present

## 2022-07-02 LAB — CBC
HCT: 35.2 % — ABNORMAL LOW (ref 36.0–46.0)
Hemoglobin: 11.4 g/dL — ABNORMAL LOW (ref 12.0–15.0)
MCH: 27.3 pg (ref 26.0–34.0)
MCHC: 32.4 g/dL (ref 30.0–36.0)
MCV: 84.2 fL (ref 80.0–100.0)
Platelets: 204 10*3/uL (ref 150–400)
RBC: 4.18 MIL/uL (ref 3.87–5.11)
RDW: 14.8 % (ref 11.5–15.5)
WBC: 8.8 10*3/uL (ref 4.0–10.5)
nRBC: 0 % (ref 0.0–0.2)

## 2022-07-02 LAB — TYPE AND SCREEN
ABO/RH(D): O POS
Antibody Screen: NEGATIVE

## 2022-07-02 LAB — PROTEIN / CREATININE RATIO, URINE
Creatinine, Urine: 113 mg/dL
Protein Creatinine Ratio: 0.36 mg/mg{Cre} — ABNORMAL HIGH (ref 0.00–0.15)
Total Protein, Urine: 41 mg/dL

## 2022-07-02 LAB — COMPREHENSIVE METABOLIC PANEL
ALT: 12 U/L (ref 0–44)
AST: 17 U/L (ref 15–41)
Albumin: 3.1 g/dL — ABNORMAL LOW (ref 3.5–5.0)
Alkaline Phosphatase: 110 U/L (ref 38–126)
Anion gap: 8 (ref 5–15)
BUN: 14 mg/dL (ref 6–20)
CO2: 23 mmol/L (ref 22–32)
Calcium: 9.7 mg/dL (ref 8.9–10.3)
Chloride: 106 mmol/L (ref 98–111)
Creatinine, Ser: 0.72 mg/dL (ref 0.44–1.00)
GFR, Estimated: >60 mL/min (ref >60)
Glucose, Bld: 107 mg/dL — ABNORMAL HIGH (ref 70–99)
Potassium: 4.6 mmol/L (ref 3.5–5.1)
Sodium: 137 mmol/L (ref 135–145)
Total Bilirubin: 0.4 mg/dL (ref 0.3–1.2)
Total Protein: 6.5 g/dL (ref 6.5–8.1)

## 2022-07-02 SURGERY — Surgical Case
Anesthesia: Spinal

## 2022-07-02 MED ORDER — FENTANYL CITRATE (PF) 100 MCG/2ML IJ SOLN
INTRAMUSCULAR | Status: DC | PRN
  Administered 2022-07-02: 10 ug via INTRATHECAL

## 2022-07-02 MED ORDER — MORPHINE SULFATE (PF) 0.5 MG/ML IJ SOLN
INTRAMUSCULAR | Status: AC
  Filled 2022-07-02: qty 10

## 2022-07-02 MED ORDER — BUPIVACAINE IN DEXTROSE 0.75-8.25 % IT SOLN
INTRATHECAL | Status: DC | PRN
  Administered 2022-07-02: 1.6 mL via INTRATHECAL

## 2022-07-02 MED ORDER — EPHEDRINE SULFATE (PRESSORS) 50 MG/ML IJ SOLN
INTRAMUSCULAR | Status: DC | PRN
  Administered 2022-07-02: 10 mg via INTRAVENOUS

## 2022-07-02 MED ORDER — PROPOFOL 10 MG/ML IV BOLUS
INTRAVENOUS | Status: AC
  Filled 2022-07-02: qty 20

## 2022-07-02 MED ORDER — SODIUM CHLORIDE FLUSH 0.9 % IV SOLN
INTRAVENOUS | Status: AC
  Filled 2022-07-02: qty 30

## 2022-07-02 MED ORDER — CEFAZOLIN SODIUM-DEXTROSE 2-4 GM/100ML-% IV SOLN
INTRAVENOUS | Status: AC
  Filled 2022-07-02: qty 100

## 2022-07-02 MED ORDER — LACTATED RINGERS IV SOLN: 500.0000 mL | INTRAVENOUS | Status: DC | PRN

## 2022-07-02 MED ORDER — PHENYLEPHRINE HCL-NACL 20-0.9 MG/250ML-% IV SOLN
INTRAVENOUS | Status: DC | PRN
  Administered 2022-07-02: 50 ug/min via INTRAVENOUS

## 2022-07-02 MED ORDER — PHENYLEPHRINE HCL-NACL 20-0.9 MG/250ML-% IV SOLN
INTRAVENOUS | Status: AC
  Filled 2022-07-02: qty 250

## 2022-07-02 MED ORDER — CHLORHEXIDINE GLUCONATE 0.12 % MT SOLN
OROMUCOSAL | Status: AC
  Administered 2022-07-02: 15 mL
  Filled 2022-07-02: qty 15

## 2022-07-02 MED ORDER — MORPHINE SULFATE (PF) 0.5 MG/ML IJ SOLN
INTRAMUSCULAR | Status: DC | PRN
  Administered 2022-07-02: 100 ug via INTRATHECAL

## 2022-07-02 MED ORDER — ACETAMINOPHEN 500 MG PO TABS
1000.0000 mg | ORAL_TABLET | Freq: Once | ORAL | Status: AC
  Administered 2022-07-02: 1000 mg via ORAL
  Filled 2022-07-02: qty 2

## 2022-07-02 MED ORDER — LIDOCAINE 5 % EX PTCH
MEDICATED_PATCH | CUTANEOUS | Status: AC
  Filled 2022-07-02: qty 1

## 2022-07-02 MED ORDER — LACTATED RINGERS IV SOLN: INTRAVENOUS | Status: DC

## 2022-07-02 MED ORDER — OXYTOCIN-SODIUM CHLORIDE 30-0.9 UT/500ML-% IV SOLN
INTRAVENOUS | Status: AC
  Filled 2022-07-02: qty 500

## 2022-07-02 MED ORDER — OXYTOCIN BOLUS FROM INFUSION: 333.0000 mL | Freq: Once | INTRAVENOUS | Status: DC

## 2022-07-02 MED ORDER — CEFAZOLIN SODIUM-DEXTROSE 2-3 GM-%(50ML) IV SOLR
INTRAVENOUS | Status: DC | PRN
  Administered 2022-07-02: 2 g via INTRAVENOUS

## 2022-07-02 MED ORDER — FENTANYL CITRATE (PF) 100 MCG/2ML IJ SOLN
INTRAMUSCULAR | Status: AC
  Filled 2022-07-02: qty 2

## 2022-07-02 MED ORDER — OXYTOCIN-SODIUM CHLORIDE 30-0.9 UT/500ML-% IV SOLN: 2.5000 [IU]/h | INTRAVENOUS | Status: DC

## 2022-07-02 SURGICAL SUPPLY — 33 items
ADH LQ OCL WTPRF AMP STRL LF (MISCELLANEOUS) ×1
ADHESIVE MASTISOL STRL (MISCELLANEOUS) ×1 IMPLANT
APL PRP STRL LF DISP 70% ISPRP (MISCELLANEOUS) ×2
BAG COUNTER SPONGE SURGICOUNT (BAG) ×1 IMPLANT
BAG SPNG CNTER NS LX DISP (BAG) ×1
BLADE BOVIE TIP EXT 4 (BLADE) IMPLANT
CHLORAPREP W/TINT 26 (MISCELLANEOUS) ×2 IMPLANT
DRSG PAD ABDOMINAL 8X10 ST (GAUZE/BANDAGES/DRESSINGS) IMPLANT
DRSG TELFA 3X8 NADH (GAUZE/BANDAGES/DRESSINGS) ×1 IMPLANT
DRSG TELFA 3X8 NADH STRL (GAUZE/BANDAGES/DRESSINGS) ×1 IMPLANT
GAUZE SPONGE 4X4 12PLY STRL (GAUZE/BANDAGES/DRESSINGS) ×1 IMPLANT
GLOVE PI ORTHO PRO STRL 7.5 (GLOVE) ×1 IMPLANT
GOWN STRL REUS W/ TWL LRG LVL3 (GOWN DISPOSABLE) ×2 IMPLANT
GOWN STRL REUS W/TWL LRG LVL3 (GOWN DISPOSABLE) ×2
KIT TURNOVER KIT A (KITS) ×1 IMPLANT
MANIFOLD NEPTUNE II (INSTRUMENTS) ×1 IMPLANT
MAT PREVALON FULL STRYKER (MISCELLANEOUS) ×1 IMPLANT
NS IRRIG 1000ML POUR BTL (IV SOLUTION) ×1 IMPLANT
PACK C SECTION AR (MISCELLANEOUS) ×1 IMPLANT
PAD DRESSING TELFA 3X8 NADH (GAUZE/BANDAGES/DRESSINGS) IMPLANT
PAD OB MATERNITY 4.3X12.25 (PERSONAL CARE ITEMS) ×1 IMPLANT
PAD PREP 24X41 OB/GYN DISP (PERSONAL CARE ITEMS) ×1 IMPLANT
RETRACTOR WND ALEXIS-O 25 LRG (MISCELLANEOUS) ×1 IMPLANT
RTRCTR WOUND ALEXIS O 25CM LRG (MISCELLANEOUS) ×2
SCRUB CHG 4% DYNA-HEX 4OZ (MISCELLANEOUS) ×1 IMPLANT
SPONGE T-LAP 18X18 ~~LOC~~+RFID (SPONGE) ×1 IMPLANT
SUT VIC AB 0 CTX 36 (SUTURE) ×2
SUT VIC AB 0 CTX36XBRD ANBCTRL (SUTURE) ×2 IMPLANT
SUT VIC AB 1 CT1 36 (SUTURE) ×2 IMPLANT
SUT VICRYL+ 3-0 36IN CT-1 (SUTURE) ×2 IMPLANT
TAPE STRIPS DRAPE STRL (GAUZE/BANDAGES/DRESSINGS) IMPLANT
TRAP FLUID SMOKE EVACUATOR (MISCELLANEOUS) ×1 IMPLANT
WATER STERILE IRR 500ML POUR (IV SOLUTION) ×1 IMPLANT

## 2022-07-02 NOTE — OB Triage Note (Signed)
Pt is G2P1 with [employer] twins  transverse/transverse at 107 weeks. Seen at MFM today and sent here for BP evaluation. Pt states her BP has been elevated all of her pregnancy. Reports good fetal movement. Reports migaine HA 7/10. No visual changes. Mild epigastric pain. 2+ relexes. No clonus. No edema noted.

## 2022-07-02 NOTE — Anesthesia Preprocedure Evaluation (Signed)
Anesthesia Evaluation  Patient identified by MRN, date of birth, ID band Patient awake    Reviewed: Allergy & Precautions, NPO status , Patient's Chart, lab work & pertinent test results  History of Anesthesia Complications Negative for: history of anesthetic complications  Airway Mallampati: III   Neck ROM: Full    Dental no notable dental hx.    Pulmonary neg pulmonary ROS,    Pulmonary exam normal breath sounds clear to auscultation       Cardiovascular hypertension (chronic HTN with superimposed preeclampsia), Normal cardiovascular exam Rhythm:Regular Rate:Normal     Neuro/Psych PSYCHIATRIC DISORDERS Anxiety Depression negative neurological ROS     GI/Hepatic GERD  ,  Endo/Other  Class 3 obesity  Renal/GU negative Renal ROS     Musculoskeletal   Abdominal   Peds  Hematology Hodgkin lymphoma 2014   Anesthesia Other Findings 31 yo G2P1001 at 39 0/7 with Kathi Der twins presenting for c-section for severe preeclampsia.  Reproductive/Obstetrics                            Anesthesia Physical Anesthesia Plan  ASA: 3  Anesthesia Plan: Spinal   Post-op Pain Management:    Induction:   PONV Risk Score and Plan: 2 and Ondansetron and Treatment may vary due to age or medical condition  Airway Management Planned: Natural Airway and Nasal Cannula  Additional Equipment:   Intra-op Plan:   Post-operative Plan:   Informed Consent: I have reviewed the patients History and Physical, chart, labs and discussed the procedure including the risks, benefits and alternatives for the proposed anesthesia with the patient or authorized representative who has indicated his/her understanding and acceptance.     Dental Advisory Given  Plan Discussed with: Anesthesiologist, CRNA and Surgeon  Anesthesia Plan Comments: (Patient reports no bleeding problems and no anticoagulant use.  Plan for spinal with  backup GA.  Patient consented for risks of anesthesia including but not limited to:  - adverse reactions to medications - damage to eyes, teeth, lips or other oral mucosa - nerve damage due to positioning  - risk of bleeding, infection and or nerve damage from spinal that could lead to paralysis - risk of headache or failed spinal - damage to teeth, lips or other oral mucosa - sore throat or hoarseness - damage to heart, brain, nerves, lungs, other parts of body or loss of life  Patient voiced understanding.)        Anesthesia Quick Evaluation

## 2022-07-02 NOTE — Progress Notes (Signed)
Maternal-Fetal Medicine  Dichorionic-diamniotic twin pregnancy.  Patient returned for fetal growth assessment and antenatal testing. She has had increased blood pressure readings at home and was advised by your office to come for a nurses visit.  But the patient could not make the visit.  Today, her blood pressures are 163/96 and 163/89 mmHg.  She complains of headaches and epigastric pain.  No blurry vision or vaginal bleeding.  Ultrasound Twin A: Lower fetus, transverse lie and head to maternal left, posterior placenta.  Amniotic fluid is normal and good fetal activity seen.  Fetal growth is appropriate for gestational age.  Antenatal testing is reassuring.  BPP 8/8.  Twin B: Upper fetus, transverse lie and head to maternal right, fundal placenta. Amniotic fluid is normal and good fetal activity seen.  Fetal growth is appropriate for gestational age.  Antenatal testing is reassuring.  BPP 8/8.  Growth discordancy: 12% (normal).  Chronic hypertension with superimposed preeclampsia This is a possible diagnosis.  Given that patient has twin pregnancy complicated by severe range blood pressure, I recommended delivery now.  I explained that severe hypertension can lead to maternal complications including eclampsia, placental abruption and endorgan damage.  I advised her to go to [facility] L&D to get evaluated.  If her blood pressures are completely normal and her symptoms are not resolved, delivery may be delayed to but not beyond [redacted] weeks gestation.  Inpatient management is preferable.  Alternatively, she can be delivered now.  Prevention of maternal complications at [redacted] weeks gestation outweighs neonatal benefits.    Patient understands that she will undergo cesarean delivery.  Patient will be going over to [facility] L&D.  I called the charge nurse and gave the patient details.  A secure chat was sent to her Ob providers.  Thank you for consultation.  If you have any questions or concerns, please  contact me the [facility].  Consultation including face-to-face (more than 50%) counseling 30 minutes.

## 2022-07-02 NOTE — Anesthesia Procedure Notes (Signed)
Spinal  Patient location during procedure: OR Start time: 07/02/2022 11:20 PM End time: 07/02/2022 11:26 PM Reason for block: surgical anesthesia Staffing Performed: anesthesiologist  Anesthesiologist: Darrin Nipper, MD Performed by: Lendon Colonel, CRNA Authorized by: Darrin Nipper, MD   Preanesthetic Checklist Completed: patient identified, IV checked, risks and benefits discussed, surgical consent, monitors and equipment checked, pre-op evaluation and timeout performed Spinal Block Patient position: sitting Prep: ChloraPrep Patient monitoring: cardiac monitor, continuous pulse ox and blood pressure Approach: midline Location: L3-4 Injection technique: single-shot Needle Needle type: Whitacre  Needle gauge: 22 G Needle length: 9 cm Assessment Sensory level: T4 Events: CSF return Additional Notes Three attempts due to os encountered at L2-3 both midline and paramedian.  Successfully placed midline at L3-4.

## 2022-07-02 NOTE — H&P (Signed)
History and Physical  Assessment/Plan:  Miranda Villegas is a 31 y.o. G2P1001 at 23w0d11/14/2023, by Last Menstrual Period who is being admitted for hypertensive disease of pregnancy, with concern for preeclampsia. Problems include: Principal Problem:   Labor and delivery, indication for care   1. Hypertension: chronic , not on medications   - Serial BP evaluation - Plan cesarean delivery given indication of Twins transverse position  at 35 weeks  2. Fetal well-being: FHT Category 1. - The patient was counseled on expected outcomes for her infant at this gestational age and NICU team was made aware of the patient's admission and status. - Betamethasone will not be administered  - Neuroprotective magnesium not indicated   3. GBS: unknown   History of Present Illness:   Chief Complaint - [place] It was noted pt had 2 elevated Bps along with headache and epigastric pain.   Patient complains of headache that has improved with tylenol. Notes that she is not sure if the pain if from the baby being in her ribs or if it is truly epigastric pain.    Fetal Movement: normal  Allergies  Adhesive tape    Medications    Aspirin 81 mg  Folic acid  PNV   OB History  OB History  Gravida Para Term Preterm AB Living  '2 1 1     1  '$ SAB IAB Ectopic Multiple Live Births        0 1    # Outcome Date GA Lbr Len/2nd Weight Sex Delivery Anes PTL Lv  2 Current           1 Term 11/14/20 436w0d3:28 / 00:10 3460 g F Vag-Spont EPI  LIV     Past Medical History  Chronic Hypertension Hodgkin's Lymphoma Obesity Anxiety and Depression Anemia   Past Surgical History  LYMPH NODE BIOPSY   08/09/13     medial neck area - lymphoma dx.   MEDIASTINOSCOPY N/A 08/09/2013    Procedure: MEDIASTINOSCOPY;  Surgeon: StMelrose NakayamaMD;  [facility];  Service: Thoracic;  Laterality: N/A;   TOOTH EXTRACTION      Past  Social History:  Social History   Socioeconomic History   Marital status: Married    Spouse name: HeMallie Mussel Number of children: Not on file   Years of education: Not on file   Highest education level: Not on file  Occupational History   Not on file  Tobacco Use   Smoking status: Never    Passive exposure: Never   Smokeless tobacco: Never  Vaping Use   Vaping Use: Never used  Substance and Sexual Activity   Alcohol use: No   Drug use: No   Sexual activity: Yes    Birth control/protection: None  Other Topics Concern   Not on file  Social History Narrative   Not on file   Social Determinants of Health   Financial Resource Strain: Not on file  Food Insecurity: No Food Insecurity (07/02/2022)   Hunger Vital Sign    Worried About Running Out of Food in the Last Year: Never true    Ran Out of Food in the Last Year: Never true  Transportation Needs: Unknown (07/02/2022)   PRAPARE - Tr[facility]ydrologistMedical): No    Lack of Transportation (Non-Medical): Not on file  Physical Activity: Not on file  Stress: Not on file  Social Connections:  Not on file     Family History  family history includes Alzheimer's disease in an other family member; Autoimmune disease in an other family member; Breast cancer in an other family member; Diabetes in her paternal grandfather; Hearing loss in an other family member; Heart disease in an other family member; Hypertension in her father; Migraines in her mother; Osteoarthritis in her mother; Parkinsonism in an other family member; Stroke in her paternal grandfather; Transient ischemic attack in an other family member.   Review of Systems   Review of Systems  Constitutional: Negative.   HENT: Negative.    Eyes: Negative.   Respiratory: Negative.    Cardiovascular: Negative.   Gastrointestinal: Negative.   Genitourinary: Negative.   Musculoskeletal: Negative.   Skin: Negative.   Neurological:  Positive for  headaches.  Endo/Heme/Allergies: Negative.   Psychiatric/Behavioral: Negative.       Vital Signs  Patient Vitals for the past 8 hrs:  BP Temp Temp src Pulse Height Weight  07/02/22 2149 (!) 149/90 -- -- (!) 104 -- --  07/02/22 2134 (!) 150/76 -- -- (!) 113 -- --  07/02/22 2119 (!) 153/86 -- -- (!) 102 -- --  07/02/22 2033 (!) 151/89 -- -- (!) 104 -- --  07/02/22 2026 (!) 159/103 98.6 F (37 C) Oral -- '5\' 10"'$  (1.778 m) (!) 151.9 kg   No intake/output data recorded.   Physical Exam  General Appearance No acute distress, well appearing and well nourished, obese.  Pulmonary Easy work of breathing.  Cardiovascular Pulse normal rate, regularity and rhythm.   Abdomen Abdomen soft, gravid,obese, no fundal tenderness, no rebound or guarding.  Genitourinary Vagina and cervix without lesions, no pooling, no bleeding.     SVE  ,  ,  Not indicated   SSE   Extremities No rash, lesions or petechiae. No bilateral cyanosis, clubbing.  Edema 1+, negative clonus, 2+ reflexes bilaterally   Neurologic Grossly intact  Psychiatric Appropriate affect    Test Results   CBC    Component Value Date/Time   WBC 8.8 07/02/2022 2059   RBC 4.18 07/02/2022 2059   HGB 11.4 (L) 07/02/2022 2059   HGB 10.8 (L) 05/28/2022 1624   HGB 13.0 02/05/2017 0913   HCT 35.2 (L) 07/02/2022 2059   HCT 33.9 (L) 05/28/2022 1624   HCT 39.9 02/05/2017 0913   PLT 204 07/02/2022 2059   PLT 230 05/28/2022 1624   MCV 84.2 07/02/2022 2059   MCV 85 05/28/2022 1624   MCV 83.2 02/05/2017 0913   MCH 27.3 07/02/2022 2059   MCHC 32.4 07/02/2022 2059   RDW 14.8 07/02/2022 2059   RDW 14.3 05/28/2022 1624   RDW 13.9 02/05/2017 0913   LYMPHSABS 1.6 05/18/2020 1416   LYMPHSABS 1.5 02/05/2017 0913   MONOABS 0.4 06/03/2019 1144   MONOABS 0.2 02/05/2017 0913   EOSABS 0.0 05/18/2020 1416   BASOSABS 0.0 05/18/2020 1416   BASOSABS 0.0 02/05/2017 0913      Latest Ref Rng & Units 07/02/2022    8:59 PM 05/28/2022    4:24 PM  01/09/2022    2:27 PM  CMP  Glucose 70 - 99 mg/dL 107  149  84   BUN 6 - 20 mg/dL '14  8  12   '$ Creatinine 0.44 - 1.00 mg/dL 0.72  0.58  0.66   Sodium 135 - 145 mmol/L 137  137  138   Potassium 3.5 - 5.1 mmol/L 4.6  4.0  4.4   Chloride 98 - 111  mmol/L 106  102  101   CO2 22 - 32 mmol/L '23  19  19   '$ Calcium 8.9 - 10.3 mg/dL 9.7  9.2  9.9   Total Protein 6.5 - 8.1 g/dL 6.5  5.9  6.8   Total Bilirubin 0.3 - 1.2 mg/dL 0.4  <0.2  <0.2   Alkaline Phos 38 - 126 U/L 110  96  91   AST 15 - 41 U/L '17  9  11   '$ ALT 0 - 44 U/L '12  11  13     '$ Component Ref Range & Units 21:00  Creatinine, Urine mg/dL 113   Total Protein, Urine mg/dL 41   Comment: NO NORMAL RANGE ESTABLISHED FOR THIS TEST  Protein Creatinine Ratio 0.00 - 0.15 mg/mg 0.36 High    Comment: Performed at [facility], [address] [place], [place] [address]   Imaging:    OBSTETRICS REPORT                       (Signed Final 07/02/2022 05:11 pm) ---------------------------------------------------------------------- Patient Info  ID #:       [id number]                          D.O.B.:  [birth year] (31 yrs)  Name:       Lurlean Horns ARDIS KAY                Visit Date: 07/02/2022 03:27 pm              [place] ---------------------------------------------------------------------- Performed By  Attending:        Tama High MD        Ref. Address:     [facility]                                                             [facility]                                                             [place]  [place]                                                             [place]  Performed By:     Nathen May       Location[facility]         [facility]  at                                                             [place] [facility]                                                             [facility]  Referred By:      Rubie Maid                    MD ---------------------------------------------------------------------- Orders  #  Description                           Code        Ordered By  1  [place] MFM OB FOLLOW UP                   60109.32    YU FANG  2  [place] MFM OB FOLLOW UP ADDL              35573.22    YU FANG     GEST  3  [place] MFM FETAL BPP WO NON               76819.01    YU FANG     STRESS  4  [place] MFM FETAL BPP WO NST               02542.7     YU FANG     ADDL GESTATION ----------------------------------------------------------------------  #  Order #                     Accession #                Episode #  1  [id number]                   [id number]                 [id number]  2  [id number]                   [id number]  [id number]  3  [id number]                   [id number]                 [id number]  4  [id number]                   [id number]                 [id number] ---------------------------------------------------------------------- Indications  Twin pregnancy, di/di, third trimester         Z76.734  Obesity complicating pregnancy, third          O99.213  trimester  Hypertension - Chronic with superimposed       O11.9 O10.919  preeclampsia  [redacted] weeks gestation of pregnancy                Z3A.35  Short interval between pregancies, 3rd         O09.893  trimester  Encounter for other antenatal screening        Z36.2  follow-up ---------------------------------------------------------------------- Vital Signs  BP:          163/96 ---------------------------------------------------------------------- Fetal Evaluation (Fetus A)  Num Of Fetuses:         2  Fetal Heart Rate(bpm):  137  Cardiac Activity:       Observed  Fetal Lie:              Lower Fetus  Presentation:           Transverse, head to maternal left   Placenta:               Posterior  P. Cord Insertion:      Not well visualized  Membrane Desc:      Dividing Membrane seen - Dichorionic.  Amniotic Fluid  AFI FV:      Within normal limits                              Largest Pocket(cm)                              5. ---------------------------------------------------------------------- Biophysical Evaluation (Fetus A)  Amniotic F.V:   Pocket => 2 cm             F. Tone:        Observed  F. Movement:    Observed                   Score:          8/8  F. Breathing:   Observed ---------------------------------------------------------------------- Biometry (Fetus A)  BPD:      90.5  mm     G. Age:  36w 5d         91  %    CI:        73.61   %    70 - 86                                                          FL/HC:      18.6   %    20.1 - 22.3  HC:  335.1  mm     G. Age:  38w 2d         91  %    HC/AC:      1.05        0.93 - 1.11  AC:      318.9  mm     G. Age:  35w 6d         79  %    FL/BPD:     68.8   %    71 - 87  FL:       62.3  mm     G. Age:  32w 2d        1.6  %    FL/AC:      19.5   %    20 - 24  Est. FW:    2627  gm    5 lb 13 oz      54  %     FW Discordancy        12  % ---------------------------------------------------------------------- OB History  Gravidity:    2         Term:   1  Living:       1 ---------------------------------------------------------------------- Gestational Age (Fetus A)  LMP:           35w 0d        Date:  10/30/21                   EDD:   08/06/22  U/S Today:     35w 6d                                        EDD:   07/31/22  Best:          35w 0d     Det. By:  LMP  (10/30/21)          EDD:   08/06/22 ---------------------------------------------------------------------- Anatomy (Fetus A)  Cranium:               Appears normal         LVOT:                   Previously seen  Cavum:                 Previously seen        Aortic Arch:            Previously seen  Ventricles:            Previously  seen        Ductal Arch:            Not well visualized  Choroid Plexus:        Previously seen        Diaphragm:              Previously seen  Cerebellum:            Previously seen        Stomach:                Appears normal, left  sided  Posterior Fossa:       Previously seen        Abdomen:                Appears normal  Nuchal Fold:           Previously seen        Abdominal Wall:         Previously seen  Face:                  Orbits and profile     Cord Vessels:           2 vessel cord,                         previously seen                                                                        absent lt UA Prev.  Lips:                  Previously seen        Kidneys:                Appear normal  Palate:                Not well visualized    Bladder:                Appears normal  Thoracic:              Previously seen        Spine:                  Previously seen  Heart:                 Previously seen        Upper Extremities:      Previously seen  RVOT:                  Previously seen        Lower Extremities:      Previously seen  Other:  Previously fetus appears to be a female. Heels/feet and open hands/5th          digits prev. visualized. Nasal bone, lenses prev. seen.   Technically          difficult due to maternal habitus and fetal position. ---------------------------------------------------------------------- Fetal Evaluation (Fetus B)  Num Of Fetuses:         2  Fetal Heart Rate(bpm):  151  Cardiac Activity:       Observed  Fetal Lie:              Upper Fetus  Presentation:           Transverse, head to maternal right  Placenta:               Fundal Left  P. Cord Insertion:      Previously Visualized  Membrane Desc:      Dividing Membrane seen - Dichorionic.  Amniotic Fluid  AFI FV:      Within normal limits  Largest Pocket(cm)                               8.2 ---------------------------------------------------------------------- Biophysical Evaluation (Fetus B)  Amniotic F.V:   Pocket => 2 cm             F. Tone:        Observed  F. Movement:    Observed                   Score:          8/8  F. Breathing:   Observed ---------------------------------------------------------------------- Biometry (Fetus B)  BPD:      86.3  mm     G. Age:  34w 6d         47  %    CI:        74.56   %    70 - 86                                                          FL/HC:      20.5   %    20.1 - 22.3  HC:      317.2  mm     G. Age:  35w 5d         32  %    HC/AC:      0.92        0.93 - 1.11  AC:      345.4  mm     G. Age:  38w 3d       > 99  %    FL/BPD:     75.4   %    71 - 87  FL:       65.1  mm     G. Age:  33w 4d         12  %    FL/AC:      18.8   %    20 - 24  Est. FW:    2977  gm      6 lb 9 oz     88  %     FW Discordancy     0 \ 12 % ---------------------------------------------------------------------- Gestational Age (Fetus B)  LMP:           35w 0d        Date:  10/30/21                   EDD:   08/06/22  U/S Today:     35w 5d                                        EDD:   08/01/22  Best:          35w 0d     Det. By:  LMP  (10/30/21)          EDD:   08/06/22 ---------------------------------------------------------------------- Anatomy (Fetus B)  Cranium:               Appears normal         LVOT:  visualized prev  Cavum:                 Previously seen        Aortic Arch:            Not well visualized  Ventricles:            Previously seen        Ductal Arch:            Previously seen  Choroid Plexus:        Previously seen        Diaphragm:              Appears normal  Cerebellum:            Previously seen        Stomach:                Appears normal, left                                                                        sided  Posterior Fossa:       Previously seen        Abdomen:                Appears normal  Nuchal  Fold:           Previously seen        Abdominal Wall:         Previously seen  Face:                  Orbits and profile     Cord Vessels:           Previously seen                         previously seen  Lips:                  Previously seen        Kidneys:                Appear normal  Palate:                Not well visualized    Bladder:                Appears normal  Thoracic:              Appears normal         Spine:                  Previously seen  Heart:                 Previously seen        Upper Extremities:      Previously seen  RVOT:                  visualized prev        Lower Extremities:      Previously seen  Other:  Previously fetus appears to be a female. Heels/feet and open hands/5th          digits prev. visualized. Nasal bone,  lenses, maxilla, mandible and falx          visualized 3VTV prev. visualized. ---------------------------------------------------------------------- Impression  Dichorionic-diamniotic twin pregnancy.  Patient returned for fetal growth assessment and antenatal  testing.  She has had increased blood pressure readings at home and  was advised by your office to come for a nurses visit.  But the  patient could not make the visit.  Today, her blood pressures are 163/96 and 163/89 mmHg.  She complains of headaches and epigastric pain.  No blurry  vision or vaginal bleeding.  Ultrasound  Twin A: Lower fetus, transverse lie and head to maternal left,  posterior placenta.  Amniotic fluid is normal and good fetal  activity seen.  Fetal growth is appropriate for gestational age.  Antenatal testing is reassuring.  BPP 8/8.  Twin B: Upper fetus, transverse lie and head to maternal  right, fundal placenta. Amniotic fluid is normal and good fetal  activity seen.  Fetal growth is appropriate for gestational age.  Antenatal testing is reassuring.  BPP 8/8.  Growth discordancy: 12% (normal).  Chronic hypertension with superimposed preeclampsia  This is a  possible diagnosis.  Given that patient has twin  pregnancy complicated by severe range blood pressure, I  recommended delivery now.  I explained that severe  hypertension can lead to maternal complications including  eclampsia, placental abruption and endorgan damage.  I advised her to go to [facility] L&D to get evaluated.  If her  blood pressures are completely normal and her symptoms  are not resolved, delivery may be delayed to but not beyond  [redacted] weeks gestation.  Inpatient management is preferable.  Alternatively, she can be delivered now.  Prevention of  maternal complications at [redacted] weeks gestation outweighs  neonatal benefits.  Patient understands that she will undergo cesarean delivery.  Patient will be going over to [facility] L&D.  I called the charge nurse and gave the patient details.  A  secure chat was sent to her Ob providers. ----------------------------------------------------------------------                  Tama High, MD Electronically Signed Final Report   07/02/2022 05:11 pm  Discussed plan of care , MFM recommendations, and lab results with patient and her partner. They are in agreement to proceed with primary cesarean section. Reviewed consent , consent signed. . All questions answered.   Philip Aspen, CNM  07/02/2022 10:50 PM

## 2022-07-03 ENCOUNTER — Encounter: Payer: Self-pay | Admitting: Obstetrics and Gynecology

## 2022-07-03 ENCOUNTER — Other Ambulatory Visit: Payer: Self-pay

## 2022-07-03 DIAGNOSIS — O114 Pre-existing hypertension with pre-eclampsia, complicating childbirth: Secondary | ICD-10-CM

## 2022-07-03 DIAGNOSIS — O30043 Twin pregnancy, dichorionic/diamniotic, third trimester: Secondary | ICD-10-CM | POA: Diagnosis not present

## 2022-07-03 DIAGNOSIS — O322XX1 Maternal care for transverse and oblique lie, fetus 1: Secondary | ICD-10-CM | POA: Diagnosis not present

## 2022-07-03 DIAGNOSIS — D62 Acute posthemorrhagic anemia: Secondary | ICD-10-CM

## 2022-07-03 DIAGNOSIS — Z3A35 35 weeks gestation of pregnancy: Secondary | ICD-10-CM

## 2022-07-03 DIAGNOSIS — O163 Unspecified maternal hypertension, third trimester: Secondary | ICD-10-CM | POA: Diagnosis not present

## 2022-07-03 DIAGNOSIS — O322XX0 Maternal care for transverse and oblique lie, not applicable or unspecified: Secondary | ICD-10-CM

## 2022-07-03 DIAGNOSIS — O9902 Anemia complicating childbirth: Secondary | ICD-10-CM

## 2022-07-03 LAB — CBC
HCT: 34.1 % — ABNORMAL LOW (ref 36.0–46.0)
Hemoglobin: 11 g/dL — ABNORMAL LOW (ref 12.0–15.0)
MCH: 27.7 pg (ref 26.0–34.0)
MCHC: 32.3 g/dL (ref 30.0–36.0)
MCV: 85.9 fL (ref 80.0–100.0)
Platelets: 189 10*3/uL (ref 150–400)
RBC: 3.97 MIL/uL (ref 3.87–5.11)
RDW: 14.6 % (ref 11.5–15.5)
WBC: 13.6 10*3/uL — ABNORMAL HIGH (ref 4.0–10.5)
nRBC: 0 % (ref 0.0–0.2)

## 2022-07-03 LAB — RPR: RPR Ser Ql: NONREACTIVE

## 2022-07-03 LAB — RAPID HIV SCREEN (HIV 1/2 AB+AG)
HIV 1/2 Antibodies: NONREACTIVE
HIV-1 P24 Antigen - HIV24: NONREACTIVE

## 2022-07-03 MED ORDER — KETOROLAC TROMETHAMINE 30 MG/ML IJ SOLN: 30.0000 mg | Freq: Four times a day (QID) | INTRAMUSCULAR | Status: AC | PRN

## 2022-07-03 MED ORDER — DIPHENHYDRAMINE HCL 50 MG/ML IJ SOLN: 12.5000 mg | INTRAMUSCULAR | Status: DC | PRN

## 2022-07-03 MED ORDER — ACETAMINOPHEN 500 MG PO TABS
1000.0000 mg | ORAL_TABLET | Freq: Four times a day (QID) | ORAL | Status: AC
  Administered 2022-07-03 – 2022-07-04 (×4): 1000 mg via ORAL
  Filled 2022-07-03 (×4): qty 2

## 2022-07-03 MED ORDER — OXYCODONE-ACETAMINOPHEN 5-325 MG PO TABS: 1.0000 | ORAL_TABLET | ORAL | Status: DC | PRN

## 2022-07-03 MED ORDER — ZOLPIDEM TARTRATE 5 MG PO TABS: 5.0000 mg | ORAL_TABLET | Freq: Every evening | ORAL | Status: DC | PRN

## 2022-07-03 MED ORDER — NALOXONE HCL 4 MG/10ML IJ SOLN: 1.0000 ug/kg/h | INTRAVENOUS | Status: DC | PRN

## 2022-07-03 MED ORDER — SCOPOLAMINE 1 MG/3DAYS TD PT72
1.0000 | MEDICATED_PATCH | Freq: Once | TRANSDERMAL | Status: AC
  Administered 2022-07-03: 1.5 mg via TRANSDERMAL
  Filled 2022-07-03: qty 1

## 2022-07-03 MED ORDER — POVIDONE-IODINE 10 % EX SWAB: 2.0000 | Freq: Once | CUTANEOUS | Status: DC

## 2022-07-03 MED ORDER — MEPERIDINE HCL 25 MG/ML IJ SOLN: 6.2500 mg | INTRAMUSCULAR | Status: DC | PRN

## 2022-07-03 MED ORDER — LABETALOL HCL 100 MG PO TABS
100.0000 mg | ORAL_TABLET | Freq: Two times a day (BID) | ORAL | Status: DC
  Administered 2022-07-03 – 2022-07-04 (×4): 100 mg via ORAL
  Filled 2022-07-03 (×3): qty 1

## 2022-07-03 MED ORDER — PROMETHAZINE HCL 25 MG PO TABS: 25.0000 mg | ORAL_TABLET | Freq: Four times a day (QID) | ORAL | Status: DC | PRN

## 2022-07-03 MED ORDER — LACTATED RINGERS IV SOLN: INTRAVENOUS | Status: DC

## 2022-07-03 MED ORDER — SENNOSIDES-DOCUSATE SODIUM 8.6-50 MG PO TABS
2.0000 | ORAL_TABLET | Freq: Every day | ORAL | Status: DC
  Administered 2022-07-03 – 2022-07-07 (×5): 2 via ORAL
  Filled 2022-07-03 (×5): qty 2

## 2022-07-03 MED ORDER — NALOXONE HCL 0.4 MG/ML IJ SOLN: 0.4000 mg | INTRAMUSCULAR | Status: DC | PRN

## 2022-07-03 MED ORDER — PRENATAL MULTIVITAMIN CH
1.0000 | ORAL_TABLET | Freq: Every day | ORAL | Status: DC
  Administered 2022-07-03 – 2022-07-07 (×5): 1 via ORAL
  Filled 2022-07-03 (×6): qty 1

## 2022-07-03 MED ORDER — DIPHENHYDRAMINE HCL 25 MG PO CAPS: 25.0000 mg | ORAL_CAPSULE | Freq: Four times a day (QID) | ORAL | Status: DC | PRN

## 2022-07-03 MED ORDER — DIPHENHYDRAMINE HCL 25 MG PO CAPS: 25.0000 mg | ORAL_CAPSULE | ORAL | Status: DC | PRN

## 2022-07-03 MED ORDER — SODIUM CHLORIDE 0.9% FLUSH: 3.0000 mL | INTRAVENOUS | Status: DC | PRN

## 2022-07-03 MED ORDER — OXYTOCIN-SODIUM CHLORIDE 30-0.9 UT/500ML-% IV SOLN
INTRAVENOUS | Status: DC | PRN
  Administered 2022-07-03: 1 mL via INTRAVENOUS
  Administered 2022-07-03: 41.7 [IU] via INTRAVENOUS

## 2022-07-03 MED ORDER — MENTHOL 3 MG MT LOZG: 1.0000 | LOZENGE | OROMUCOSAL | Status: DC | PRN

## 2022-07-03 MED ORDER — LABETALOL HCL 100 MG PO TABS
ORAL_TABLET | ORAL | Status: AC
  Filled 2022-07-03: qty 1

## 2022-07-03 MED ORDER — ONDANSETRON HCL 4 MG/2ML IJ SOLN: 4.0000 mg | Freq: Three times a day (TID) | INTRAMUSCULAR | Status: DC | PRN

## 2022-07-03 MED ORDER — IBUPROFEN 600 MG PO TABS
600.0000 mg | ORAL_TABLET | Freq: Four times a day (QID) | ORAL | Status: AC
  Administered 2022-07-03 – 2022-07-05 (×11): 600 mg via ORAL
  Filled 2022-07-03 (×12): qty 1

## 2022-07-03 MED ORDER — LACTATED RINGERS IV SOLN: INTRAVENOUS | Status: DC | PRN

## 2022-07-03 MED ORDER — OXYTOCIN-SODIUM CHLORIDE 30-0.9 UT/500ML-% IV SOLN
2.5000 [IU]/h | INTRAVENOUS | Status: AC
  Filled 2022-07-03: qty 500

## 2022-07-03 MED ORDER — SODIUM CHLORIDE 0.9 % IV SOLN
2.0000 g | INTRAVENOUS | Status: DC
  Filled 2022-07-03: qty 2

## 2022-07-03 MED ORDER — SIMETHICONE 80 MG PO CHEW
80.0000 mg | CHEWABLE_TABLET | Freq: Four times a day (QID) | ORAL | Status: DC
  Administered 2022-07-03 – 2022-07-07 (×16): 80 mg via ORAL
  Filled 2022-07-03 (×16): qty 1

## 2022-07-03 NOTE — Lactation Note (Signed)
This note was copied from a baby's chart. Lactation Consultation Note  Patient Name: Miranda Villegas [id number] Date: 07/03/2022 Reason for consult: Initial assessment;Late-preterm 34-36.6wks;Infant < 6lbs;Multiple gestation;NICU baby Age:29 hours  Maternal Data Has patient been taught Hand Expression?: Yes Does the patient have breastfeeding experience prior to this delivery?: Yes  Mom has a hx of cancer/cancer treatments for Hodgkins Lymphoma. She had some limited milk supply with first baby; born in 2022.  She is pumping for her Di-Di- twin boys now, no output as of yet, but voices understanding of the process and importance of consistency.  Feeding Mother's Current Feeding Choice: Breast Milk and Formula  LATCH Score    Lactation Tools Discussed/Used Tools: Pump Breast pump type: Double-Electric Breast Pump Pump Education: Setup, frequency, and cleaning;Milk Storage Reason for Pumping: SCN; preterm Pumping frequency: q 3 hours  Interventions Interventions: Breast massage;Hand express;Breast compression;DEBP;Education  Discharge Pump: Personal  Consult Status Consult Status: PRN  Encouraged to call out for pumping assistance if needed.  Lavonia Drafts 07/03/2022, 10:30 AM

## 2022-07-03 NOTE — Transfer of Care (Signed)
Immediate Anesthesia Transfer of Care Note  Patient: Miranda Villegas  Procedure(s) Performed: CESAREAN SECTION  Patient Location: PACU  Anesthesia Type:Spinal  Level of Consciousness: awake, alert  and oriented  Airway & Oxygen Therapy: Patient Spontanous Breathing  Post-op Assessment: Report given to RN and Post -op Vital signs reviewed and stable  Post vital signs: Reviewed  Last Vitals:  Vitals Value Taken Time  BP    Temp    Pulse    Resp    SpO2      Last Pain:  Vitals:   07/02/22 2027  TempSrc:   PainSc: 8          Complications: No notable events documented.

## 2022-07-03 NOTE — Progress Notes (Signed)
Progress Note - Cesarean Delivery  Miranda Villegas is a 31 y.o. [phone] now PP day 1 s/p    Miranda Villegas, Miranda Villegas [[id number]]  C-Section, Low Transverse    Miranda Villegas, Miranda Villegas [[id number]]  C-Section, Low Transverse .   Subjective:  Patient reports no problems with eating, bowel movements, voiding, or their wound  Babies in NICU on C-pap  Objective:  Vital signs in last 24 hours: Temp:  [98.2 F (36.8 C)-99.2 F (37.3 C)] 98.2 F (36.8 C) (10/11 0801) Pulse Rate:  [72-113] 87 (10/11 0801) Resp:  [16-27] 18 (10/11 0801) BP: (130-176)/(71-103) 153/91 (10/11 0801) SpO2:  [92 %-99 %] 98 % (10/11 0801) Weight:  [151.9 kg] 151.9 kg (10/10 2026)  Physical Exam:  General: alert, cooperative, and no distress Lochia: appropriate  Incision: dressing intact    Data Review Recent Labs    07/02/22 2059 07/03/22 0545  HGB 11.4* 11.0*  HCT 35.2* 34.1*    Assessment:  Principal Problem:   Labor and delivery, indication for care   Status post Cesarean section. Doing well postoperatively.     Plan:       Continue current care.  OOB, may shower  Finis Bud, M.D. 07/03/2022 9:09 AM

## 2022-07-03 NOTE — Progress Notes (Signed)
Mom set up for pumping. Instructions given and encouraged mom to pump every 3hrs to meet demands. Handout given for milk storage.

## 2022-07-03 NOTE — Op Note (Addendum)
OP NOTE  Date: 07/03/2022   12:48 AM Name Miranda Villegas MR# [id number]  Preoperative Diagnosis: 1. Intrauterine pregnancy at 36w1dPrincipal Problem:   Labor and delivery, indication for care  2.  Chronic HTN with superimposed pre-eclapsia  Delivery recommended by MFM 3.  Transverse lie x 2  Postoperative Diagnosis: 1. Intrauterine pregnancy at 397w1ddelivered 2. Viable infants  female 3. Remainder same as pre-op   Procedure: 1. Primary Low-Transverse Cesarean Section  Surgeon: DaFinis BudMD  Assistant:  Thompson CNM     No other capable assistant was available for this surgery which requires an experienced, high level assistant.   Anesthesia:    SpTeshia, Mahone0[[id number]]Spinal    SpDemaris Callanderberli [[id number]]Spinal    EBL:    SpCheryn, Lundquist0[[id number]]0    SpTacia, Hindley0[[id number]]0  ml     Findings: 1) female infant, Apgar scores of    SpJessicca, Stitzer0[[id number]]    SpHalle, Davlin0[[id number]]   at 1 minute and    SpJohnna, Bollier0[[id number]]    SpHenritta, Mutz0[[id number]]   at 5 minutes and a birthweight of    SpIshika, Chesterfield0[[id number]]    SpToba, Claudioberli [[id number]]  ounces.    2) Normal uterus, tubes and ovaries.    Procedure:  The patient was prepped and draped in the supine position and placed under spinal anesthesia.  A transverse incision was made across the abdomen in a Pfannenstiel manner. If indicated the old scar was systematically removed with sharp dissection.  We carried the dissection down to the level of the fascia.  The fascia was incised in a curvilinear manner.  The fascia was then elevated from the rectus muscles with blunt and sharp dissection.  The rectus muscles were separated laterally exposing the peritoneum.  The peritoneum was carefully entered with care being taken to avoid bowel and bladder.  A self-retaining retractor was placed.  The visceral  peritoneum was incised in a curvilinear fashion across the lower uterine segment creating a bladder flap. A transverse incision was made across the lower uterine segment and extended laterally and superiorly using the bandage scissors.  Artificial rupture membranes was performed and Clear fluid was noted.  Baby A was delivered from the complete breech presentation and baby B was delivered from the footling position.  After an appropriate time interval, the cord was doubly clamped and cut. Cord blood was obtained if required.  The infants were handed to the pediatric personnel  who then placed the them under heat lamps where they were cleaned dried and suctioned as needed. The placentas were delivered. The hysterotomy incision was then identified on ring forceps.  The uterine cavity was cleaned with a moist lap sponge.  The hysterotomy incision was closed with a running interlocking suture of Vicryl.  Hemostasis was excellent.  Pitocin was run in the IV and the uterus was found to be firm. The posterior cul-de-sac and gutters were cleaned and inspected.  Hemostasis was noted.  The fascia was then closed with a running suture of #1 Vicryl.  Hemostasis of the subcutaneous tissues was obtained using the Bovie.  The subcutaneous tissues were closed with a running suture of 000 Vicryl.  A subcuticular suture was placed.  Steri-strips were applied in the usual manner.  A Lidoderm patch  was applied.  A pressure dressing was placed.  The patient went to the recovery room in stable condition. Grandville Silos CNM provided exposure, dissection, suctioning, retraction, and general support and assistance during the procedure.   Finis Bud, M.D. 07/03/2022 12:48 AM

## 2022-07-03 NOTE — Anesthesia Postprocedure Evaluation (Signed)
Anesthesia Post Note  Patient: Miranda Villegas kay Grosvenor  Procedure(s) Performed: [place]  Patient location during evaluation: Mother Baby Anesthesia Type: Spinal Level of consciousness: awake and alert, oriented and patient cooperative Pain management: pain level controlled Vital Signs Assessment: post-procedure vital signs reviewed and stable Respiratory status: spontaneous breathing, nonlabored ventilation and respiratory function stable Cardiovascular status: blood pressure returned to baseline and stable Postop Assessment: adequate PO intake, no headache and spinal receding Anesthetic complications: no   No notable events documented.   Last Vitals:  Vitals:   07/03/22 0500 07/03/22 0600  BP:    Pulse:    Resp:    Temp:    SpO2: 97% 95%    Last Pain:  Vitals:   07/03/22 0430  TempSrc:   PainSc: 0-No pain                 Darrin Nipper

## 2022-07-03 NOTE — Lactation Note (Signed)
This note was copied from a baby's chart. Lactation Consultation Note  Patient Name: Miranda Villegas [id number] Date: 07/03/2022 Reason for consult: Follow-up assessment Age:29 hours  Maternal Data Does the patient have breastfeeding experience prior to this delivery?: Yes  Feeding    LATCH Score                    Lactation Tools Discussed/Used Breast pump type: Double-Electric Breast Pump Reason for Pumping: twins in SCN  Interventions    Discharge Pump: DEBP  Consult Status Consult Status: PRN    Lenore Cordia 07/03/2022, 7:48 PM

## 2022-07-03 NOTE — Lactation Note (Signed)
This note was copied from a baby's chart. Lactation Consultation Note  Patient Name: Miranda Villegas [id number] Date: 07/03/2022 Reason for consult: Follow-up assessment Age:29 hours  Maternal Data Does the patient have breastfeeding experience prior to this delivery?: Yes  Feeding    LATCH Score                    Lactation Tools Discussed/Used Breast pump type: Double-Electric Breast Pump Reason for Pumping: twins in SCN  Interventions  Mother had questions about timeline to expect mature milk at volume. Encouraged to use DEBP q 2-3 hr day/q 4hr night for 10-15 minutes at comfortable suction strength. Mother states 71m flanges were used with previous child but she had low supply due to some prior "breast surgery". Regular timeline of 2-5 days to come to volume discussed. Self-care with rest and fluids encouraged. Support person at bedside. No current needs at this time.  Discharge Pump: DEBP  Consult Status Consult Status: PRN    LLenore Cordia10/07/2022, 7:40 PM

## 2022-07-04 ENCOUNTER — Ambulatory Visit: Payer: Medicaid Other

## 2022-07-04 ENCOUNTER — Encounter: Payer: Self-pay | Admitting: Obstetrics and Gynecology

## 2022-07-04 ENCOUNTER — Encounter: Payer: No Typology Code available for payment source | Admitting: Certified Nurse Midwife

## 2022-07-04 ENCOUNTER — Encounter: Payer: No Typology Code available for payment source | Admitting: Obstetrics and Gynecology

## 2022-07-04 ENCOUNTER — Other Ambulatory Visit: Payer: No Typology Code available for payment source

## 2022-07-04 MED ORDER — LABETALOL HCL 200 MG PO TABS
200.0000 mg | ORAL_TABLET | Freq: Two times a day (BID) | ORAL | Status: DC
  Administered 2022-07-04 – 2022-07-06 (×4): 200 mg via ORAL
  Filled 2022-07-04 (×4): qty 1

## 2022-07-04 NOTE — Progress Notes (Signed)
Obstetric Postpartum/PostOperative Daily Progress Note Subjective:  31 y.o. [id number] post-operative day # 1 status post primary cesarean delivery (She delivered at 0001 and 0002 on 07/03/22).  She is ambulating, is tolerating po, is voiding spontaneously.  Her pain is well controlled on PO pain medications. Her lochia is less than menses. She is currently pumping. Her newborns are in the Special Care Nursery.   Medications SCHEDULED MEDICATIONS   ibuprofen  600 mg Oral Q6H   labetalol  200 mg Oral BID   prenatal multivitamin  1 tablet Oral Q1200   scopolamine  1 patch Transdermal Once   senna-docusate  2 tablet Oral Daily   simethicone  80 mg Oral QID    MEDICATION INFUSIONS   lactated ringers 150 mL/hr at 07/03/22 0605   naloxone HCl (NARCAN) 2 mg in dextrose 5 % 250 mL infusion      PRN MEDICATIONS  diphenhydrAMINE **OR** diphenhydrAMINE, diphenhydrAMINE, menthol-cetylpyridinium, naloxone **AND** sodium chloride flush, naloxone HCl (NARCAN) 2 mg in dextrose 5 % 250 mL infusion, ondansetron (ZOFRAN) IV, oxyCODONE-acetaminophen, promethazine, zolpidem    Objective:   Vitals:   07/03/22 2301 07/04/22 0305 07/04/22 1003 07/04/22 1005  BP:  (!) 149/77 (!) 142/88 (!) 142/88  Pulse: 99 94 (!) 106 (!) 106  Resp: '18 20  17  '$ Temp: 98.3 F (36.8 C) 98 F (36.7 C)  98.5 F (36.9 C)  TempSrc: Oral Oral  Oral  SpO2: 99% 98%  98%  Weight:      Height:        Current Vital Signs 24h Vital Sign Ranges  T 98.5 F (36.9 C) Temp  Avg: 98 F (36.7 C)  Min: 97.5 F (36.4 C)  Max: 98.5 F (36.9 C)  BP (!) 142/88 BP  Min: 137/74  Max: 153/91  HR (!) 106 Pulse  Avg: 94.3  Min: 79  Max: 106  RR 17 Resp  Avg: 18.2  Min: 17  Max: 20  SaO2 98 % Room Air SpO2  Avg: 99 %  Min: 98 %  Max: 100 %       24 Hour I/O Current Shift I/O  Time Ins Outs 10/11 0701 - 10/12 0700 In: -  Out: 1800 [Urine:1800] No intake/output data recorded.   General: NAD Pulmonary: no increased work of  breathing Abdomen: non-distended, non-tender, fundus firm at level of umbilicus Inc: Clean/dry/intact Extremities: no edema, no erythema, no tenderness  Labs:  Recent Labs  Lab 07/02/22 2059 07/03/22 0545  WBC 8.8 13.6*  HGB 11.4* 11.0*  HCT 35.2* 34.1*  PLT 204 189     Assessment:   31 y.o. G2P1103 postoperative day # 1 status post primary cesarean section, lactating  Plan:  1) Acute blood loss anemia - hemodynamically stable and asymptomatic - po ferrous sulfate  2) O POS / Rubella <0.90 (03/27 0943)/ Varicella Not immune  3) TDAP status declined  4) plans to breastfeed/Contraception =  considering vasectomy  5) Disposition: continue current care   Rod Can, CNM 07/04/2022 10:12 AM

## 2022-07-05 LAB — SURGICAL PATHOLOGY

## 2022-07-05 MED ORDER — ACETAMINOPHEN 325 MG PO TABS
650.0000 mg | ORAL_TABLET | Freq: Four times a day (QID) | ORAL | Status: DC | PRN
  Administered 2022-07-05 – 2022-07-07 (×4): 650 mg via ORAL
  Filled 2022-07-05 (×3): qty 2

## 2022-07-05 NOTE — Progress Notes (Signed)
Obstetric Postpartum/PostOperative Daily Progress Note Subjective:  31 y.o. [id number] post-operative day # 2 status post primary cesarean delivery for transverse di/di twins.  She is ambulating, is tolerating po, is voiding spontaneously.  Her pain is well controlled on PO pain medications, currently only taking ibuprofen and acetaminophen. Her lochia is consistent with a heavy period. Denies cramping pain. Has had three Bms. Has been pumping with medela symphony pump, already had pump at home. Babies Shanon Brow and Francoise Ceo are in special care nursery, now on RA and normal cribs although not taking feeds by mouth yet. Has been doing lick/learn at the breast.    Medications SCHEDULED MEDICATIONS   ibuprofen  600 mg Oral Q6H   labetalol  200 mg Oral BID   prenatal multivitamin  1 tablet Oral Q1200   scopolamine  1 patch Transdermal Once   senna-docusate  2 tablet Oral Daily   simethicone  80 mg Oral QID    MEDICATION INFUSIONS   lactated ringers 150 mL/hr at 07/03/22 0605   naloxone HCl (NARCAN) 2 mg in dextrose 5 % 250 mL infusion      PRN MEDICATIONS  diphenhydrAMINE **OR** diphenhydrAMINE, diphenhydrAMINE, menthol-cetylpyridinium, naloxone **AND** sodium chloride flush, naloxone HCl (NARCAN) 2 mg in dextrose 5 % 250 mL infusion, ondansetron (ZOFRAN) IV, oxyCODONE-acetaminophen, promethazine, zolpidem    Objective:   Vitals:   07/04/22 2300 07/05/22 0320 07/05/22 0757 07/05/22 1240  BP: 139/72 132/67 (!) 149/87 137/88  Pulse: (!) 102 (!) 115 95 94  Resp: '18 18 20 16  '$ Temp: 98.5 F (36.9 C) 99.2 F (37.3 C) 98.7 F (37.1 C) 98.1 F (36.7 C)  TempSrc: Axillary Oral Oral Oral  SpO2: 99% 97% 96% 99%  Weight:      Height:        Current Vital Signs 24h Vital Sign Ranges  T 98.1 F (36.7 C) Temp  Avg: 98.4 F (36.9 C)  Min: 97.9 F (36.6 C)  Max: 99.2 F (37.3 C)  BP 137/88 BP  Min: 132/67  Max: 149/87  HR 94 Pulse  Avg: 101.8  Min: 94  Max: 115  RR 16 Resp  Avg: 18  Min: 16  Max: 20   SaO2 99 %  (room air) SpO2  Avg: 98.5 %  Min: 96 %  Max: 100 %       24 Hour I/O Current Shift I/O  Time Ins Outs No intake/output data recorded. No intake/output data recorded.  General: NAD Pulmonary: no increased work of breathing Abdomen: non-distended, non-tender, fundus firm at level of umbilicus Inc: dressing intact Extremities: no edema, no erythema, no tenderness Light lochia rubra  Labs:  Recent Labs  Lab 07/02/22 2059 07/03/22 0545  WBC 8.8 13.6*  HGB 11.4* 11.0*  HCT 35.2* 34.1*  PLT 204 189     Assessment:   31 y.o. G2P1103 postoperative day # 2 status post primary cesarean section Chronic HTN with superimposed preeclampsia Pumping RNI/VNI- declines immunizations  Plan:  1) Acute blood loss anemia - hemodynamically stable and asymptomatic - po ferrous sulfate  2) O POS / Rubella <0.90 (03/27 0943) non immune/ Varicella immune : immunizations declined  3) TDAP status : declined  4) breast /Contraception = unsure of contraception but considering vasectomy, states she may think about it around four weeks postpartum to consider. Is currently pumping due to babies prematurity   5) chronic HTN with superimposed preeclampsia. Has had mild range Bps. Labetalol dosage increased yesterday from '100mg'$  BID to '200mg'$  BID. Will continue  to monitor.   6) anticipate d/c home tomorrow  [place], [place] 07/05/2022 2:17 PM   2

## 2022-07-06 MED ORDER — IBUPROFEN 600 MG PO TABS
600.0000 mg | ORAL_TABLET | Freq: Four times a day (QID) | ORAL | Status: DC | PRN
  Administered 2022-07-06 – 2022-07-07 (×3): 600 mg via ORAL
  Filled 2022-07-06 (×3): qty 1

## 2022-07-06 MED ORDER — LABETALOL HCL 100 MG PO TABS
100.0000 mg | ORAL_TABLET | ORAL | Status: AC
  Administered 2022-07-06: 100 mg via ORAL
  Filled 2022-07-06: qty 1

## 2022-07-06 MED ORDER — LABETALOL HCL 200 MG PO TABS
300.0000 mg | ORAL_TABLET | Freq: Two times a day (BID) | ORAL | Status: DC
  Administered 2022-07-06 – 2022-07-07 (×2): 300 mg via ORAL
  Filled 2022-07-06 (×2): qty 1

## 2022-07-06 NOTE — Discharge Summary (Signed)
Postpartum Discharge Summary  Date of Service updated 07/07/2022     Patient Name: Miranda Villegas DOB: 1991/06/11 MRN: [id number]  Date of admission: 07/02/2022 Delivery date: 07/03/2022   Kimble, Delaurentis [[id number]]  07/03/2022    Brodie, Scovell [[id number]]  07/03/2022  Delivering provider:    Reinaldo Berber [[id number]]  EVANS, DAVID Collins [place], BoyB Lurlean Horns [[id number]]  Harlin Heys  Date of discharge: 07/07/2022  Admitting diagnosis: Labor and delivery, indication for care [O75.9] Intrauterine pregnancy: [redacted]w[redacted]d    Secondary diagnosis:  Principal Problem:   Labor and delivery, indication for care Active Problems:   Postpartum care following cesarean delivery  Additional problems: Preeclampsia  Discharge diagnosis: Preterm Pregnancy Delivered and Preeclampsia (mild)                                              Post partum procedures: None Augmentation: N/A Complications: None  Hospital course: Sceduled C/S   31y.o. yo G2P1103 at 372w1das admitted to the hospital 07/02/2022 for scheduled cesarean section with the following indication:Malpresentation, Multifetal Gestation, and preeclampsia .Delivery details are as follows:  Membrane Rupture Time/Date:    SpKylieann, Eagles0[[id number]]12:01 AM    SpStephani Police0[[id number]]12:02 AM ,   SpShealyn, Sean0[[id number]]07/03/2022    SpZuriel, Yeaman0[[id number]]07/03/2022   Delivery Method:   SpReinaldo Berber0[[id number]]C-Section, Low Transverse    SpCassidi, Modesitt0[[id number]]C-Section, Low Transverse  Details of operation can be found in separate operative note.  Patient had a postpartum course complicated by elevated blood pressures.  She is ambulating, tolerating a regular diet, passing flatus, and urinating well. Patient is discharged home in stable condition on          Newborn Data: Birth date:   SpZarra, Geffert0[[id number]]07/03/2022    SpAnyae, Griffith0[[id number]]07/03/2022  Birth time:   SpEmmalia, Heyboer0[[id number]]12:01 AM    SpStephani Police0[[id number]]12:02 AM  Gender:   SpNica, Friske0[[id number]]Female    SpDioselina, Brumbaugh0[[id number]]Female  Living status:   SpDilia, Alemany0[[id number]]Living    SpCheryle, Dark0[[id number]]Living  Apgars:   SpOtha, Rickles0[[id number]]    SpMansi, Tokar0[[id number]] ,   SpSomer, Trotter0[[id number]]    SpAbuk, Selleck0[[id number]]  Weight:   SpLadoris, Lythgoe0[[id number]]    SpCorra, Kaine0[[id number]]     Magnesium Sulfate received: No BMZ received: No Rhophylac:N/A MMR:No T-DaP:Given prenatally Flu: No Transfusion:No  Physical exam  Vitals:   07/06/22 2004 07/07/22 0051 07/07/22 0402 07/07/22 0743  BP: (!) 148/81 (!) 143/86 129/61 132/77  Pulse: (!) 103 95 100 98  Resp: 20 20 20 20   Temp: 98.5 F (36.9 C) 98.9 F (37.2 C) 98.4 F (36.9 C) 98.5 F (36.9 C)  TempSrc: Oral Oral Oral Oral  SpO2: 100% 97% 96% 98%  Weight:      Height:       General: alert, cooperative, and no distress Lochia: appropriate Uterine Fundus: firm Incision: Healing well with no significant drainage, No significant erythema, Dressing  is clean, dry, and intact DVT Evaluation: No evidence of DVT seen on physical exam. Labs: Lab Results  Component Value Date   WBC 13.6 (H) 07/03/2022   HGB 11.0 (L) 07/03/2022   HCT 34.1 (L) 07/03/2022   MCV 85.9 07/03/2022   PLT 189 07/03/2022      Latest Ref Rng & Units 07/02/2022    8:59 PM  CMP  Glucose 70 - 99 mg/dL 107   BUN 6 - 20 mg/dL 14   Creatinine 0.44 - 1.00 mg/dL 0.72   Sodium 135 - 145 mmol/L 137   Potassium 3.5 - 5.1 mmol/L 4.6   Chloride 98 - 111 mmol/L 106   CO2 22 - 32 mmol/L 23   Calcium 8.9 - 10.3 mg/dL 9.7   Total Protein 6.5 - 8.1 g/dL 6.5   Total Bilirubin 0.3 - 1.2 mg/dL 0.4   Alkaline Phos 38 - 126 U/L 110   AST 15 - 41 U/L 17   ALT 0 - 44 U/L  12    Edinburgh Score:    07/04/2022    7:20 AM  Edinburgh Postnatal Depression Scale Screening Tool  I have been able to laugh and see the funny side of things. 0  I have looked forward with enjoyment to things. 0  I have blamed myself unnecessarily when things went wrong. 0  I have been anxious or worried for no good reason. 0  I have felt scared or panicky for no good reason. 0  Things have been getting on top of me. 0  I have been so unhappy that I have had difficulty sleeping. 0  I have felt sad or miserable. 0  I have been so unhappy that I have been crying. 0  The thought of harming myself has occurred to me. 0  Edinburgh Postnatal Depression Scale Total 0      After visit meds:  Allergies as of 07/07/2022       Reactions   Adhesive [tape] Rash        Medication List     STOP taking these medications    aspirin EC 81 MG tablet   Citrucel 500 MG Tabs Generic drug: Methylcellulose (Laxative)   folic acid 1 MG tablet Commonly known as: FOLVITE   loratadine 10 MG tablet Commonly known as: CLARITIN       TAKE these medications    cholecalciferol 1000 units tablet Commonly known as: VITAMIN D Take 2,000 Units by mouth daily.   Iron 325 (65 Fe) MG Tabs Take 325 mg by mouth daily. Take 1/2 tablets once daily   multivitamin-prenatal 27-0.8 MG Tabs tablet Take 1 tablet by mouth daily at 12 noon.   VITAMIN C PO Take 125 mg by mouth. Take 2 tablets daily               Discharge Care Instructions  (From admission, onward)           Start     Ordered   07/07/22 0000  Leave dressing on - Keep it clean, dry, and intact until clinic visit        07/07/22 1014             Discharge home in stable condition Infant Feeding:  expressed breast milk Infant Disposition:NICU Discharge instruction: per After Visit Summary and Postpartum booklet. Activity: Advance as tolerated. Pelvic rest for 6 weeks.  Diet: routine diet Anticipated Birth  Control: Unsure Postpartum Appointment: 2 weeks and 6 weeks Additional Postpartum F/U:  Incision check 1 week and BP check 1 week Future Appointments:No future appointments. Follow up Visit:  Follow-up Information     [facility]. Schedule an appointment as soon as possible for a visit.   Specialty: Obstetrics and Gynecology Why: Incision check/BP check in one week Video visit in 2 weeks  Postpartum office visit in 6 weeks Contact information: [address] [place] [address] [phone]                    07/07/2022 Imagene Riches, CNM

## 2022-07-06 NOTE — Progress Notes (Addendum)
Subjective: Postpartum Day 3: Cesarean Delivery Miranda Villegas is feeling well. She has been ambulating, voiding, and tolerating POs without difficulty. Her pain is well-controlled with ibuprofen and Tylenol. She has had a bowel movement. Her bleeding is WNL. She is pumping breastmilk for the twins. Her mood is stable. BPs have been 130s-150s/60s-80s on 200 mg Bid of labetalol.  Objective: Vital signs in last 24 hours: Temp:  [98.1 F (36.7 C)-99.1 F (37.3 C)] 99.1 F (37.3 C) (10/14 0902) Pulse Rate:  [93-111] 93 (10/14 0902) Resp:  [16-20] 20 (10/14 0902) BP: (132-158)/(69-88) 151/84 (10/14 0902) SpO2:  [98 %-100 %] 100 % (10/14 0902)  Physical Exam:  General: alert, cooperative, and appears stated age Cardiac: RRR Lungs:  CTAB Abdomen: soft, non-tender, normal BS Lochia: appropriate Uterine Fundus: firm Incision: healing well, no significant drainage, honeycomb dressing intact DVT Evaluation: No evidence of DVT seen on physical exam.  No results for input(s): "HGB", "HCT" in the last 72 hours.  Assessment/Plan: Status post Cesarean section. Doing well postoperatively.  Labetalol increased to 300 mg BID. Will continue to monitor BPs through the afternoon. Plan to d/c home if WNL or stay overnight if they remain elevated. Discharge instructions reviewed  Lurlean Horns, CNM 07/06/2022, 9:50 AM

## 2022-07-07 MED ORDER — LABETALOL HCL 300 MG PO TABS: 300.0000 mg | ORAL_TABLET | Freq: Two times a day (BID) | ORAL | 1 refills | Status: DC

## 2022-07-07 NOTE — Progress Notes (Signed)
Patient discharged home with family.  Discharge instructions, when to follow up, and prescriptions reviewed with patient.  Patient verbalized understanding. Patient will be escorted out by auxiliary.   

## 2022-07-08 LAB — HEPATITIS B SURFACE ANTIGEN

## 2022-07-09 ENCOUNTER — Ambulatory Visit: Payer: No Typology Code available for payment source

## 2022-07-09 ENCOUNTER — Other Ambulatory Visit: Payer: No Typology Code available for payment source

## 2022-07-09 ENCOUNTER — Ambulatory Visit (INDEPENDENT_AMBULATORY_CARE_PROVIDER_SITE_OTHER): Payer: Medicaid Other | Admitting: Obstetrics and Gynecology

## 2022-07-09 ENCOUNTER — Encounter: Payer: Self-pay | Admitting: Obstetrics and Gynecology

## 2022-07-09 VITALS — BP 119/77 | HR 90 | Resp 16 | Ht 70.0 in | Wt 315.1 lb

## 2022-07-09 DIAGNOSIS — O119 Pre-existing hypertension with pre-eclampsia, unspecified trimester: Secondary | ICD-10-CM

## 2022-07-09 DIAGNOSIS — Z4889 Encounter for other specified surgical aftercare: Secondary | ICD-10-CM

## 2022-07-09 DIAGNOSIS — Z98891 History of uterine scar from previous surgery: Secondary | ICD-10-CM

## 2022-07-09 DIAGNOSIS — Z09 Encounter for follow-up examination after completed treatment for conditions other than malignant neoplasm: Secondary | ICD-10-CM

## 2022-07-09 NOTE — Patient Instructions (Signed)

## 2022-07-09 NOTE — Progress Notes (Signed)
    OBSTETRICS/GYNECOLOGY POST-OPERATIVE CLINIC VISIT  Subjective:     Miranda Villegas is a 31 y.o. female who presents to the clinic 1 weeks status post CESAREAN SECTION for pre-eclampsia at [redacted] weeks gestation, di-di twins with malpresentation (transverse x 2). Eating a regular diet without difficulty. Bowel movements are normal. Pain is controlled with current analgesics. Medications being used: ibuprofen (OTC).   She does desire to breast-feed.  Reports normal mood, depression screening negative.  Vaginal bleeding is normal to light flow, no clots.   The following portions of the patient's history were reviewed and updated as appropriate: allergies, current medications, past family history, past medical history, past social history, past surgical history, and problem list.  Review of Systems Pertinent items are noted in HPI.   Objective:   BP 119/77   Pulse 90   Resp 16   Ht '5\' 10"'$  (1.778 m)   Wt (!) 315 lb 1.6 oz (142.9 kg)   BMI 45.21 kg/m  Body mass index is 45.21 kg/m.  General:  alert and no distress  Abdomen: soft, bowel sounds active, non-tender  Incision:   healing well, no drainage, no erythema, no hernia, no seroma, no swelling, no dehiscence, incision well approximated.     Pathology:  None  Assessment:   Patient s/p CESAREAN SECTION (primary) Doing well postoperatively. Pre-eclampsia in third trimester   Plan:   1. Continue any current medications as instructed by provider. 2. Wound care discussed.  3. Activity restrictions: no bending, stooping, or squatting, no lifting more than 10-15 pounds, and pelvic rest 4. Anticipated return to work: not applicable. 5. Blood pressures well controlled on Labetalol. Continue until postpartum visit.  6. Follow up: 2 weeks for PPV    Rubie Maid, MD [facility]

## 2022-07-11 ENCOUNTER — Ambulatory Visit: Payer: Medicaid Other

## 2022-07-13 ENCOUNTER — Encounter: Payer: Self-pay | Admitting: Obstetrics and Gynecology

## 2022-07-15 ENCOUNTER — Other Ambulatory Visit: Payer: No Typology Code available for payment source

## 2022-07-15 ENCOUNTER — Ambulatory Visit: Payer: No Typology Code available for payment source

## 2022-07-15 ENCOUNTER — Ambulatory Visit: Payer: No Typology Code available for payment source | Admitting: Obstetrics

## 2022-07-18 ENCOUNTER — Ambulatory Visit: Payer: Medicaid Other

## 2022-07-18 ENCOUNTER — Encounter: Payer: Self-pay | Admitting: Obstetrics and Gynecology

## 2022-07-23 ENCOUNTER — Encounter: Payer: Self-pay | Admitting: Obstetrics and Gynecology

## 2022-08-01 ENCOUNTER — Encounter: Payer: Self-pay | Admitting: Obstetrics and Gynecology

## 2022-08-01 NOTE — Telephone Encounter (Signed)
I think it is ok. Advise to place a small band-aid over the areas for 1-2 days and it should heal just fine.

## 2022-08-12 NOTE — Progress Notes (Unsigned)
   OBSTETRICS POSTPARTUM CLINIC PROGRESS NOTE  Subjective:     Miranda Villegas is a 31 y.o. [phone] female who presents for a postpartum visit. She is 6 weeks postpartum following a CESAREAN SECTION . I have fully reviewed the prenatal and intrapartum course. The delivery was at 64 gestational weeks.  Anesthesia: {anesthesia types:812}. Postpartum course has been ***. Baby's course has been ***. Baby is feeding by {breast/bottle:69}. Bleeding: patient {HAS HAS JWJ:19147} not resumed menses, with No LMP recorded.. Bowel function is {normal:32111}. Bladder function is {normal:32111}. Patient {is/is not:9024} sexually active. Contraception method desired is {contraceptive method:5051}. Postpartum depression screening: {neg default:13464::"negative"}.  EDPS score is ***.    The following portions of the patient's history were reviewed and updated as appropriate: allergies, current medications, past family history, past medical history, past social history, past surgical history, and problem list.  Review of Systems {ros; complete:30496}   Objective:    There were no vitals taken for this visit.  General:  alert and no distress   Breasts:  inspection negative, no nipple discharge or bleeding, no masses or nodularity palpable  Lungs: clear to auscultation bilaterally  Heart:  regular rate and rhythm, S1, S2 normal, no murmur, click, rub or gallop  Abdomen: soft, non-tender; bowel sounds normal; no masses,  no organomegaly.  ***Well healed Pfannenstiel incision   Vulva:  normal  Vagina: normal vagina, no discharge, exudate, lesion, or erythema  Cervix:  no cervical motion tenderness and no lesions  Corpus: normal size, contour, position, consistency, mobility, non-tender  Adnexa:  normal adnexa and no mass, fullness, tenderness  Rectal Exam: Not performed.         Labs:  Lab Results  Component Value Date   HGB 11.0 (L) 07/03/2022     Assessment:   No diagnosis found.   Plan:     1. Contraception: IUD 2. Will check Hgb for h/o postpartum anemia of less than 10.  3. Follow up in:  4-6  weeks or as needed.         GYNECOLOGY OFFICE PROCEDURE NOTE  Miranda Villegas is a 31 y.o. [phone] here for *** IUD insertion. No GYN concerns.  Last pap smear was on *** and was normal.  IUD Insertion Procedure Note Patient identified, informed consent performed, consent signed.   Discussed risks of irregular bleeding, cramping, infection, malpositioning or misplacement of the IUD outside the uterus which may require further procedure such as laparoscopy. Also discussed >99% contraception efficacy, increased risk of ectopic pregnancy with failure of method.   Emphasized that this did not protect against STIs, condoms recommended during all sexual encounters. Time out was performed.  Urine pregnancy test negative.  Speculum placed in the vagina.  Cervix visualized.  Cleaned with Betadine x 2.  Grasped anteriorly with a single tooth tenaculum.  Uterus sounded to *** cm.  *** IUD placed per manufacturer's recommendations.  Strings trimmed to 3 cm. Tenaculum was removed, good hemostasis noted.  Patient tolerated procedure well.   Patient was given post-procedure instructions.  She was advised to have backup contraception for one week.  Patient was also asked to check IUD strings periodically and follow up in 4 weeks for IUD check.    Chilton Greathouse, CMA [facility], MD [facility]

## 2022-08-13 ENCOUNTER — Encounter: Payer: Self-pay | Admitting: Obstetrics and Gynecology

## 2022-08-13 ENCOUNTER — Ambulatory Visit (INDEPENDENT_AMBULATORY_CARE_PROVIDER_SITE_OTHER): Payer: Medicaid Other | Admitting: Obstetrics and Gynecology

## 2022-08-13 DIAGNOSIS — Z30430 Encounter for insertion of intrauterine contraceptive device: Secondary | ICD-10-CM | POA: Diagnosis not present

## 2022-08-13 DIAGNOSIS — Z8759 Personal history of other complications of pregnancy, childbirth and the puerperium: Secondary | ICD-10-CM | POA: Insufficient documentation

## 2022-08-13 DIAGNOSIS — O30009 Twin pregnancy, unspecified number of placenta and unspecified number of amniotic sacs, unspecified trimester: Secondary | ICD-10-CM

## 2022-08-13 HISTORY — DX: Personal history of other complications of pregnancy, childbirth and the puerperium: Z87.59

## 2022-08-13 MED ORDER — PARAGARD INTRAUTERINE COPPER IU IUD
1.0000 | INTRAUTERINE_SYSTEM | Freq: Once | INTRAUTERINE | Status: AC
  Administered 2022-08-13: 1 via INTRAUTERINE

## 2022-08-13 NOTE — Patient Instructions (Signed)

## 2022-08-14 ENCOUNTER — Ambulatory Visit: Payer: No Typology Code available for payment source | Admitting: Obstetrics and Gynecology

## 2022-08-19 ENCOUNTER — Encounter: Payer: Self-pay | Admitting: Obstetrics and Gynecology

## 2022-09-17 ENCOUNTER — Telehealth: Payer: Self-pay

## 2022-09-17 NOTE — Telephone Encounter (Signed)
Pt calling; has IUD ck appt tomorrow and has started period; okay to keep appt?  Adv to keep appt.

## 2022-09-18 ENCOUNTER — Ambulatory Visit (INDEPENDENT_AMBULATORY_CARE_PROVIDER_SITE_OTHER): Payer: Medicaid Other | Admitting: Obstetrics and Gynecology

## 2022-09-18 ENCOUNTER — Encounter: Payer: Self-pay | Admitting: Obstetrics and Gynecology

## 2022-09-18 VITALS — BP 132/63 | HR 98 | Ht 70.0 in | Wt 309.0 lb

## 2022-09-18 DIAGNOSIS — Z30431 Encounter for routine checking of intrauterine contraceptive device: Secondary | ICD-10-CM

## 2022-09-18 NOTE — Progress Notes (Signed)
     GYNECOLOGY OFFICE ENCOUNTER NOTE  History:  31 y.o. G2P1103 here today for today for IUD string check; Paragard  IUD was placed  08/13/2022. No complaints about the IUD, no concerning side effects.  Does note first period after insertion was heavier than normal.  Currently just started new cycle today.   The following portions of the patient's history were reviewed and updated as appropriate: allergies, current medications, past family history, past medical history, past social history, past surgical history and problem list. Last pap smear on 06/2021 was normal, negative HRHPV.  Review of Systems:  Pertinent items are noted in HPI.  Objective:  Physical Exam Blood pressure 132/63, pulse 98, height '5\' 10"'$  (1.778 m), weight (!) 309 lb (140.2 kg), not currently breastfeeding. CONSTITUTIONAL: Well-developed, well-nourished female in no acute distress.  NEUROLOGIC: Alert and oriented to person, place, and time. Normal reflexes, muscle tone coordination.  ABDOMEN: Soft, no distention noted.   PELVIC: Normal appearing external genitalia; normal appearing vaginal mucosa and cervix.  IUD strings visualized, about 3  cm in length outside cervix. Done in the presence of a chaperone.  EXTREMITIES: Non-tender, no edema or cyanosis  Assessment & Plan:  Patient to keep IUD in place for up to 10-12 years; can come in for removal if she desires pregnancy earlier or for any concerning side effects.    Rubie Maid, MD [facility] at [facility]

## 2022-09-27 DIAGNOSIS — H66003 Acute suppurative otitis media without spontaneous rupture of ear drum, bilateral: Secondary | ICD-10-CM | POA: Diagnosis not present

## 2022-12-05 ENCOUNTER — Encounter: Payer: Self-pay | Admitting: Obstetrics and Gynecology

## 2022-12-05 ENCOUNTER — Ambulatory Visit (INDEPENDENT_AMBULATORY_CARE_PROVIDER_SITE_OTHER): Payer: Medicaid Other | Admitting: Obstetrics and Gynecology

## 2022-12-05 ENCOUNTER — Other Ambulatory Visit (HOSPITAL_COMMUNITY)
Admission: RE | Admit: 2022-12-05 | Discharge: 2022-12-05 | Disposition: A | Discharge status: Home / Self Care | Payer: Medicaid Other | Source: Ambulatory Visit | Attending: Obstetrics and Gynecology | Admitting: Obstetrics and Gynecology

## 2022-12-05 VITALS — BP 137/73 | HR 102 | Resp 16 | Ht 70.0 in | Wt 308.0 lb

## 2022-12-05 DIAGNOSIS — B3731 Acute candidiasis of vulva and vagina: Secondary | ICD-10-CM

## 2022-12-05 DIAGNOSIS — K602 Anal fissure, unspecified: Secondary | ICD-10-CM | POA: Insufficient documentation

## 2022-12-05 MED ORDER — FLUCONAZOLE 150 MG PO TABS: 150.0000 mg | ORAL_TABLET | Freq: Once | ORAL | 3 refills | Status: AC

## 2022-12-05 NOTE — Progress Notes (Signed)
    GYNECOLOGY PROGRESS NOTE  Subjective:    Patient ID: Miranda Villegas, female    DOB: July 04, 1991, 32 y.o.   MRN: [id number]  HPI  Patient is a 32 y.o. G23P1103 female who presents for evaluation of possible yeast infection. She is unsure what is going on, denies discharge and odor but has been having some vaginal itching x 1 month. She also complains of fissures at her perineum and feels tearing when she uses the bathroom. She had them before and they resolved after treatment with 3-day Monistat and hydrocortisone cream but has now returned. She reports her husband has similar issues with cracked or breaking skin around the head of his penis.   The following portions of the patient's history were reviewed and updated as appropriate: allergies, current medications, past family history, past medical history, past social history, past surgical history, and problem list.  Review of Systems Pertinent items noted in HPI and remainder of comprehensive ROS otherwise negative.   Objective:   Blood pressure 137/73, pulse (!) 102, resp. rate 16, height 5\' 10"  (1.778 m), weight (!) 308 lb (139.7 kg), not currently breastfeeding. Body mass index is 44.19 kg/m. General appearance: alert, cooperative, and no distress Abdomen: soft, non-tender; bowel sounds normal; no masses,  no organomegaly Pelvic: external genitalia normal, scant thin white discharge present at introitus, rectovaginal septum normal.  Vagina scant thick white discharge.  Cervix normal appearing, no lesions and no motion tenderness.  Uterus mobile, nontender, normal shape and size.  Adnexae non-palpable, nontender bilaterally.     Assessment:   1. Yeast infection involving the vagina and surrounding area   2. Perineal fissure      Plan:   Vaginitis testing performed today.  Will prophylactically treat with Diflucan tablets 2.  Can continue to use hydrocortisone cream for perineal fissures, also can use coconut oil for  irritation effect. 3.  Return if symptoms worsen or fail to improve.   Rubie Maid, MD [place]

## 2022-12-09 LAB — CERVICOVAGINAL ANCILLARY ONLY
Bacterial Vaginitis (gardnerella): NEGATIVE
Candida Glabrata: NEGATIVE
Candida Vaginitis: POSITIVE — AB
Comment: NEGATIVE
Comment: NEGATIVE
Comment: NEGATIVE

## 2023-01-26 DIAGNOSIS — J0110 Acute frontal sinusitis, unspecified: Secondary | ICD-10-CM | POA: Diagnosis not present

## 2023-02-10 ENCOUNTER — Encounter: Payer: Self-pay | Admitting: Obstetrics and Gynecology

## 2023-04-22 ENCOUNTER — Encounter: Payer: Self-pay | Admitting: Certified Nurse Midwife

## 2023-04-22 ENCOUNTER — Other Ambulatory Visit (HOSPITAL_COMMUNITY)
Admission: RE | Admit: 2023-04-22 | Discharge: 2023-04-22 | Disposition: A | Discharge status: Home / Self Care | Payer: Medicaid Other | Source: Ambulatory Visit | Attending: Obstetrics and Gynecology | Admitting: Obstetrics and Gynecology

## 2023-04-22 ENCOUNTER — Ambulatory Visit (INDEPENDENT_AMBULATORY_CARE_PROVIDER_SITE_OTHER): Payer: Medicaid Other | Admitting: Certified Nurse Midwife

## 2023-04-22 VITALS — BP 122/79 | HR 82 | Wt 308.6 lb

## 2023-04-22 DIAGNOSIS — N898 Other specified noninflammatory disorders of vagina: Secondary | ICD-10-CM | POA: Diagnosis not present

## 2023-04-22 DIAGNOSIS — Z30431 Encounter for routine checking of intrauterine contraceptive device: Secondary | ICD-10-CM | POA: Diagnosis not present

## 2023-04-22 MED ORDER — FLUCONAZOLE 150 MG PO TABS: 150.0000 mg | ORAL_TABLET | Freq: Once | ORAL | 0 refills | Status: AC

## 2023-04-22 NOTE — Progress Notes (Signed)
    GYNECOLOGY OFFICE ENCOUNTER NOTE  History:  32 y.o. [id number] here today for today for IUD string check; Paragard  IUD was placed  08/13/2022. Complains of lower abdominal cramping,increased cramping and bleeding with her last period.   The following portions of the patient's history were reviewed and updated as appropriate: allergies, current medications, past family history, past medical history, past social history, past surgical history and problem list. Last pap smear on 06/27/2021 was normal.  Review of Systems:  Pertinent items are noted in HPI.   Objective:  Physical Exam Blood pressure 122/79, pulse 82, weight (!) 308 lb 9.6 oz (140 kg), not currently breastfeeding. CONSTITUTIONAL: Well-developed, well-nourished female in no acute distress.  NEUROLOGIC: Alert and oriented to person, place, and time. Normal reflexes, muscle tone coordination.  PSYCHIATRIC: Normal mood and affect. Normal behavior. Normal judgment and thought content. CARDIOVASCULAR: Normal heart rate noted RESPIRATORY: Effort and breath sounds normal, no problems with respiration noted ABDOMEN: Soft, no distention noted.   PELVIC: Normal appearing external genitalia; normal appearing vaginal mucosa and cervix.  IUD strings visualized, about 3 cm in length outside cervix. White particulate discharge noted. Swab collected  Assessment & Plan:  U/s order for IUD check. Swab collected. Pt note she has had a few yeast infections . Discussed recurrent vaginal infections with IUD. Discussed testing for BV as well and treating both it both are present. Discussed if returns after treatment them doing treatment for recurrent /resistant infection).  Will follow up with results of u/s and swab.   Doreene Burke, CNM   Jaynie Collins, MD, FACOG Obstetrician & Gynecologist, [facility] for [facility], [facility]

## 2023-04-23 ENCOUNTER — Other Ambulatory Visit: Payer: Self-pay | Admitting: Certified Nurse Midwife

## 2023-04-23 ENCOUNTER — Encounter: Payer: Self-pay | Admitting: Certified Nurse Midwife

## 2023-04-23 MED ORDER — FLUCONAZOLE 150 MG PO TABS: 150.0000 mg | ORAL_TABLET | Freq: Once | ORAL | 0 refills | Status: AC

## 2023-04-25 ENCOUNTER — Ambulatory Visit (INDEPENDENT_AMBULATORY_CARE_PROVIDER_SITE_OTHER): Payer: Medicaid Other | Admitting: Obstetrics

## 2023-04-25 ENCOUNTER — Encounter: Payer: Self-pay | Admitting: Obstetrics

## 2023-04-25 VITALS — BP 119/84 | HR 91 | Ht 70.0 in | Wt 307.0 lb

## 2023-04-25 DIAGNOSIS — Z30432 Encounter for removal of intrauterine contraceptive device: Secondary | ICD-10-CM | POA: Diagnosis not present

## 2023-04-25 NOTE — Progress Notes (Signed)
  IUD REMOVAL  SUBJECTIVE  HPI Miranda Villegas is a 32 y.o. [phone] who presents today for IUD removal. She had a Paragard IUD placed 08/13/22. Since then, she has experienced heavy cramping and bleeding and increased migraines. She does not want another pregnancy and plans to use condoms for contraception.   Past Medical History:  Diagnosis Date   COVID-19    Fungal skin infection 09/01/2014   History of severe pre-eclampsia 08/13/2022   hodgkins lymphoma 08/12/2013   lymphoma recently dx   Mass in neck    right side    Past Surgical History:  Procedure Laterality Date   CESAREAN SECTION  07/02/2022   Procedure: CESAREAN SECTION;  Surgeon: Linzie Collin, MD;  Location: [facility] ORS;  Service: Obstetrics;;   LYMPH NODE BIOPSY  08/09/13   medial neck area - lymphoma dx.   MEDIASTINOSCOPY N/A 08/09/2013   Procedure: MEDIASTINOSCOPY;  Surgeon: Loreli Slot, MD;  Location:  Hospital OR;  Service: Thoracic;  Laterality: N/A;   TOOTH EXTRACTION     OBJECTIVE  BP 119/84   Pulse 91   Ht 5\' 10"  (1.778 m)   Wt (!) 307 lb (139.3 kg)   Breastfeeding No   BMI 44.05 kg/m   Pelvic exam: normal external genitalia, vulva, vagina, cervix, uterus and adnexa, VULVA: normal appearing vulva with no masses, tenderness or lesions, VAGINA: normal appearing vagina with normal color and discharge, no lesions, CERVIX: normal appearing cervix without discharge or lesions. IUD strings visible at os.  Procedure note: We discussed the procedure and consent was obtained. The IUD strings were visualized at the cervical os and grasped with ring forceps. The IUD was removed easily with gentle traction.  ASSESSMENT/PLAN  IUD removal Post-procedure care reviewed Follow up if different contraception is desired RTC for annual visit   Glenetta Borg, CNM

## 2023-05-07 ENCOUNTER — Ambulatory Visit: Payer: Medicaid Other | Admitting: Obstetrics and Gynecology

## 2023-09-12 ENCOUNTER — Encounter: Payer: Self-pay | Admitting: Obstetrics and Gynecology

## 2023-09-15 MED ORDER — NYSTATIN 100000 UNIT/GM EX POWD: 1.0000 | Freq: Two times a day (BID) | CUTANEOUS | 3 refills | Status: DC

## 2023-10-24 ENCOUNTER — Emergency Department (HOSPITAL_BASED_OUTPATIENT_CLINIC_OR_DEPARTMENT_OTHER)
Admission: EM | Admit: 2023-10-24 | Discharge: 2023-10-24 | Disposition: A | Discharge status: Home / Self Care | Payer: Self-pay | Attending: Emergency Medicine | Admitting: Emergency Medicine

## 2023-10-24 ENCOUNTER — Encounter (HOSPITAL_BASED_OUTPATIENT_CLINIC_OR_DEPARTMENT_OTHER): Payer: Self-pay | Admitting: Urology

## 2023-10-24 ENCOUNTER — Other Ambulatory Visit: Payer: Self-pay

## 2023-10-24 DIAGNOSIS — T162XXA Foreign body in left ear, initial encounter: Secondary | ICD-10-CM | POA: Insufficient documentation

## 2023-10-24 DIAGNOSIS — W448XXA Other foreign body entering into or through a natural orifice, initial encounter: Secondary | ICD-10-CM | POA: Insufficient documentation

## 2023-10-24 NOTE — ED Provider Notes (Signed)
[facility] Provider Note   CSN: [id number] Arrival date & time: 10/24/23  1121     History  Chief Complaint  Patient presents with   Foreign Body in Ear    [name] is a 33 y.o. female.  Patient presents to the emergency department for evaluation of left ear pain.  Patient reports losing a small stud that was in her tragus.  This is about 4 days ago.  Yesterday she tried to clean her ear with a Q-tip and had pain.  Family member looked in the ear and saw the stud.  No muffled hearing.  No drainage from the ear or bleeding from the ear.       Home Medications Prior to Admission medications   Medication Sig Start Date End Date Taking? Authorizing Provider  cholecalciferol (VITAMIN D) 1000 units tablet Take 2,000 Units by mouth daily.    [provider]  nystatin (MYCOSTATIN/NYSTOP) powder Apply 1 Application topically 2 (two) times daily. 09/15/23   Hildred Laser, MD  PARAGARD INTRAUTERINE COPPER IU by Intrauterine route.    [provider]      Allergies    Tape    Review of Systems   Review of Systems  Physical Exam Updated Vital Signs BP 139/78 (BP Location: Left Arm)   Pulse 91   Temp 98 F (36.7 C)   Resp 18   Ht 5\' 10"  (1.778 m)   Wt (!) 139 kg   SpO2 100%   BMI 43.97 kg/m  Physical Exam Vitals and nursing note reviewed.  Constitutional:      Appearance: She is well-developed.  HENT:     Head: Normocephalic and atraumatic.     Left Ear: Tympanic membrane is not injected or erythematous.     Ears:     Comments: Small stud foreign body noted deep in ear canal in the dependent portion. Eyes:     Conjunctiva/sclera: Conjunctivae normal.  Pulmonary:     Effort: No respiratory distress.  Musculoskeletal:     Cervical back: Normal range of motion and neck supple.  Skin:    General: Skin is warm and dry.  Neurological:     Mental Status: She is alert.     ED Results / Procedures /  Treatments   Labs (all labs ordered are listed, but only abnormal results are displayed) Labs Reviewed - No data to display  EKG None  Radiology No results found.  Procedures Procedures    Medications Ordered in ED Medications - No data to display  ED Course/ Medical Decision Making/ A&P    Patient seen and examined. History obtained directly from patient.   Labs/EKG: None ordered  Imaging: None ordered  Medications/Fluids: None ordered  Most recent vital signs reviewed and are as follows: BP 139/78 (BP Location: Left Arm)   Pulse 91   Temp 98 F (36.7 C)   Resp 18   Ht 5\' 10"  (1.778 m)   Wt (!) 139 kg   SpO2 100%   BMI 43.97 kg/m   Initial impression: Ear canal foreign body.  I attempted to grasp the stud with alligator forceps, however unable to retrieve.  Ear was irrigated by RN with saline.  The stud then was flushed out of the ear canal.  On repeat exam, TM appears normal.  Home treatment plan: Monitoring  Return instructions discussed with patient: Return with worsening ear pain, drainage  Follow-up instructions discussed with patient: PCP as needed  Medical Decision Making  Patient with ear canal foreign body.  No evidence of TM perforation or injury.  This was removed with irrigation.        Final Clinical Impression(s) / ED Diagnoses Final diagnoses:  Acute foreign body of left ear canal, initial encounter    Rx / DC Orders ED Discharge Orders     None         Renne Crigler, PA-C 10/24/23 1344    Maia Plan, MD 10/24/23 [phone]

## 2023-10-24 NOTE — Discharge Instructions (Signed)
Please read and follow all provided instructions.  Your diagnoses today include:  1. Acute foreign body of left ear canal, initial encounter    Tests performed today include: Vital signs. See below for your results today.   Medications prescribed:  None  Home care instructions:  Follow any educational materials contained in this packet.  Follow-up instructions: Please follow-up with your primary care provider as needed for further evaluation of your symptoms.  Return instructions:  Please return to the Emergency Department if you experience worsening symptoms.  Please return if you have any other emergent concerns.  Additional Information:  Your vital signs today were: BP 139/78 (BP Location: Left Arm)   Pulse 91   Temp 98 F (36.7 C)   Resp 18   Ht 5\' 10"  (1.778 m)   Wt (!) 139 kg   SpO2 100%   BMI 43.97 kg/m  If your blood pressure (BP) was elevated above 135/85 this visit, please have this repeated by your doctor within one month. ---------------

## 2023-10-24 NOTE — ED Triage Notes (Signed)
Pt states has earring stuck in left ear x 4 days  Denies any drainage Reports tenderness

## 2023-11-21 ENCOUNTER — Other Ambulatory Visit: Payer: Self-pay | Admitting: Certified Nurse Midwife

## 2023-12-22 ENCOUNTER — Encounter (HOSPITAL_BASED_OUTPATIENT_CLINIC_OR_DEPARTMENT_OTHER): Payer: Self-pay | Admitting: Student

## 2023-12-22 ENCOUNTER — Ambulatory Visit (INDEPENDENT_AMBULATORY_CARE_PROVIDER_SITE_OTHER): Payer: Self-pay | Admitting: Student

## 2023-12-22 VITALS — BP 125/85 | HR 89 | Temp 98.2°F | Resp 16 | Ht 69.69 in | Wt 310.9 lb

## 2023-12-22 DIAGNOSIS — F418 Other specified anxiety disorders: Secondary | ICD-10-CM | POA: Insufficient documentation

## 2023-12-22 DIAGNOSIS — G43009 Migraine without aura, not intractable, without status migrainosus: Secondary | ICD-10-CM | POA: Insufficient documentation

## 2023-12-22 DIAGNOSIS — Z7689 Persons encountering health services in other specified circumstances: Secondary | ICD-10-CM

## 2023-12-22 DIAGNOSIS — Z8571 Personal history of Hodgkin lymphoma: Secondary | ICD-10-CM

## 2023-12-22 NOTE — Assessment & Plan Note (Signed)
 Stable. Experiences classic migraines approximately once per month, often triggered by stress and potentially by strong aromas. Symptoms include throbbing occipital pain, photophobia, phonophobia, and nausea. Excedrin is effective but causes nausea due to caffeine. No aura is present. Prefers non-pharmacological management and is interested in natural remedies. Magnesium glycinate is recommended for its gastrointestinal tolerability and potential sleep benefits. - Recommend daily magnesium glycinate 400 mg, taken 30 minutes to an hour before bedtime, to help with migraines and sleep. - Advise avoiding known migraine triggers, such as strong aromas. - Continue using Excedrin at the onset of migraine symptoms. - Consider triptan medication if Excedrin becomes ineffective.

## 2023-12-22 NOTE — Progress Notes (Signed)
 New Patient Office Visit  Subjective    Patient ID: Miranda Villegas, female    DOB: 05-Apr-1991  Age: 33 y.o. MRN: [id number]  CC:  Chief Complaint  Patient presents with   Establish Care    Here to establish care.   Migraine    Had a headache for 1 week and then had a bad migraine where pt had to lay in bed. Husband thought it was the new [employer] plug in [employer] and unplugged it, has not had headache since. Did get migraines when was pregnant with twins.    Discussed the use of AI scribe software for clinical note transcription with the patient, who gave verbal consent to proceed.  History of Present Illness   Miranda Villegas is a 33 year old female who presents with migraines and to establish care.  She experiences migraines approximately once a month, often triggered by high stress situations. The migraines are characterized by throbbing pain located at the occipital region, extending down her neck, and are accompanied by photophobia and phonophobia. Excedrin is effective in alleviating her migraine symptoms, although it causes nausea due to its caffeine content. She prefers to manage her migraines without medication but uses Excedrin when necessary.  She has a history of high stress and anxiety, which she manages through art, reading, and occasional exercise. She experiences heart palpitations during high stress situations, which have subsided over the past month. She previously attended therapy during high school and was on fluoxetine for two years, which she disliked due to feeling 'numb'.  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 She lives with her mother, who is a Engineer, site and provides occasional childcare support. Her husband works long hours, limiting his availability to assist at home. She reports high stress levels due to her busy household and limited personal time.  She has a  past medical history of Hodgkin lymphoma diagnosed at age 47, which was treated successfully. She reports no residual effects from the chemotherapy.  Her family history includes her mother and grandmother experiencing migraines, which began in similar life circumstances.     Screenings:  Colon Cancer: no Lung Cancer: no Breast Cancer: no Cervical Cancer: UP TO DATE (last year). Diabetes: indicated at physical HLD: indicated at phsycial    Outpatient Encounter Medications as of 12/22/2023  Medication Sig   cholecalciferol (VITAMIN D) 1000 units tablet Take 2,000 Units by mouth daily.   [DISCONTINUED] nystatin (MYCOSTATIN/NYSTOP) powder Apply 1 Application topically 2 (two) times daily.   [DISCONTINUED] PARAGARD INTRAUTERINE COPPER IU by Intrauterine route.   No facility-administered encounter medications on file as of 12/22/2023.    Past Medical History:  Diagnosis Date   COVID-19    Fungal skin infection 09/01/2014   History of severe pre-eclampsia 08/13/2022   hodgkins lymphoma 08/12/2013   lymphoma recently dx   Mass in neck    right side     Past Surgical History:  Procedure Laterality Date   CESAREAN SECTION  07/02/2022   Procedure: CESAREAN SECTION;  Surgeon: Linzie Collin, MD;  Location: [facility] ORS;  Service: Obstetrics;;   LYMPH NODE BIOPSY  08/09/2013   medial neck area - lymphoma dx.   MEDIASTINOSCOPY N/A 08/09/2013   Procedure: MEDIASTINOSCOPY;  Surgeon: Loreli Slot, MD;  Location: [facility] OR;  Service: Thoracic;  Laterality: N/A;   PORTA CATH INSERTION     right side of  chest   TOOTH EXTRACTION      Family History  Problem Relation Age of Onset   Osteoarthritis Mother    Migraines Mother    Hypertension Father    Endometriosis Sister    Stroke Paternal Grandfather    Diabetes Paternal Grandfather    Alzheimer's disease Other    Heart disease Other    Hearing loss Other    Parkinsonism Other    Breast cancer Other    Transient ischemic attack  Other    Autoimmune disease Other    Colon cancer Neg Hx    Stomach cancer Neg Hx    Asthma Neg Hx     Social History   Socioeconomic History   Marital status: Married    Spouse name: Sherilyn Cooter   Number of children: 3   Years of education: Not on file   Highest education level: Not on file  Occupational History   Not on file  Tobacco Use   Smoking status: Never    Passive exposure: Never   Smokeless tobacco: Never  Vaping Use   Vaping status: Never Used  Substance and Sexual Activity   Alcohol use: Never   Drug use: Never   Sexual activity: Yes    Birth control/protection: None  Other Topics Concern   Not on file  Social History Narrative   3 kids, 2 are twins   Social Drivers of Corporate investment banker Strain: Not on file  Food Insecurity: No Food Insecurity (12/22/2023)   Hunger Vital Sign    Worried About Running Out of Food in the Last Year: Never true    Ran Out of Food in the Last Year: Never true  Transportation Needs: No Transportation Needs (12/22/2023)   PRAPARE - Administrator, Civil Service (Medical): No    Lack of Transportation (Non-Medical): No  Physical Activity: Not on file  Stress: Not on file  Social Connections: Not on file  Intimate Partner Violence: Not At Risk (12/22/2023)   Humiliation, Afraid, Rape, and Kick questionnaire    Fear of Current or Ex-Partner: No    Emotionally Abused: No    Physically Abused: No    Sexually Abused: No    ROS  Per HPI      Objective    BP 125/85   Pulse 89   Temp 98.2 F (36.8 C) (Oral)   Resp 16   Ht 5' 9.69" (1.77 m)   Wt (!) 310 lb 14.4 oz (141 kg)   LMP 10/26/2023 (Approximate)   SpO2 98%   Breastfeeding No   BMI 45.01 kg/m   Physical Exam Constitutional:      General: She is not in acute distress.    Appearance: Normal appearance. She is not ill-appearing.  HENT:     Head: Normocephalic and atraumatic.     Comments: Small horizontal scar just above sternum    Right  Ear: External ear normal.     Left Ear: External ear normal.     Nose: Nose normal.     Mouth/Throat:     Mouth: Mucous membranes are moist.     Pharynx: Oropharynx is clear.  Eyes:     General: No scleral icterus.    Extraocular Movements: Extraocular movements intact.     Conjunctiva/sclera: Conjunctivae normal.     Pupils: Pupils are equal, round, and reactive to light.  Neck:     Vascular: No carotid bruit.  Cardiovascular:     Rate and Rhythm:  Normal rate and regular rhythm.     Pulses: Normal pulses.     Heart sounds: Normal heart sounds. No murmur heard.    No friction rub.  Pulmonary:     Effort: Pulmonary effort is normal. No respiratory distress.     Breath sounds: Normal breath sounds. No wheezing, rhonchi or rales.  Musculoskeletal:        General: Normal range of motion.     Cervical back: Neck supple.     Right lower leg: No edema.     Left lower leg: No edema.  Skin:    General: Skin is warm and dry.     Coloration: Skin is not jaundiced or pale.  Neurological:     General: No focal deficit present.     Mental Status: She is alert.     Deep Tendon Reflexes: Reflexes normal.  Psychiatric:        Mood and Affect: Mood normal.        Behavior: Behavior normal.         Assessment & Plan:   Encounter to establish care  Migraine without aura and without status migrainosus, not intractable Assessment & Plan: Stable. Experiences classic migraines approximately once per month, often triggered by stress and potentially by strong aromas. Symptoms include throbbing occipital pain, photophobia, phonophobia, and nausea. Excedrin is effective but causes nausea due to caffeine. No aura is present. Prefers non-pharmacological management and is interested in natural remedies. Magnesium glycinate is recommended for its gastrointestinal tolerability and potential sleep benefits. - Recommend daily magnesium glycinate 400 mg, taken 30 minutes to an hour before bedtime, to  help with migraines and sleep. - Advise avoiding known migraine triggers, such as strong aromas. - Continue using Excedrin at the onset of migraine symptoms. - Consider triptan medication if Excedrin becomes ineffective.   History of Hodgkin's lymphoma Assessment & Plan: Diagnosed at age 89, treated with chemotherapy, and in remission for 10 years. No current residual effects from chemotherapy, but advised to monitor for potential long-term effects on lungs and bones- none that she knows of.   Situational anxiety Assessment & Plan: Experiences situational anxiety related to high stress from managing three young children and a busy household. Symptoms include palpitations and tachycardia during high-stress situations. History of therapy and fluoxetine in high school but prefers non-pharmacological management currently. Open to non-pharmacological interventions. Propranolol discussed as an as-needed option for anxiety if tachycardia persists, noting its potential side effects and benefits in reducing heart rate and anxiety symptoms. - Provide information on [facility] for potential therapy options. - Discuss propranolol as an as-needed option for anxiety if tachycardia persists.   General Health Maintenance Last Pap smear in April 2024, performed annually. No family history of colon, lung, or breast cancer. Blood pressure and pulse monitored due to stress-related fluctuations. Discussed the importance of regular monitoring to ensure blood pressure remains below 130/80 mmHg. - Schedule a primary care physical in three months, including lab work. - Monitor blood pressure and pulse, ensuring she remains within normal ranges.  Follow-up Advised to follow up to monitor migraine frequency and effectiveness of current management strategies. - Schedule follow-up appointment in three months to reassess migraine management and overall health.     Return in about 3 months (around  03/22/2024) for migraines, Annual Physical.   Teryl Lucy Jamaia Brum, PA-C

## 2023-12-22 NOTE — Assessment & Plan Note (Signed)
 Experiences situational anxiety related to high stress from managing three young children and a busy household. Symptoms include palpitations and tachycardia during high-stress situations. History of therapy and fluoxetine in high school but prefers non-pharmacological management currently. Open to non-pharmacological interventions. Propranolol discussed as an as-needed option for anxiety if tachycardia persists, noting its potential side effects and benefits in reducing heart rate and anxiety symptoms. - Provide information on [facility] for potential therapy options. - Discuss propranolol as an as-needed option for anxiety if tachycardia persists.

## 2023-12-22 NOTE — Patient Instructions (Signed)
 It was nice to see you today!  As we discussed in clinic:  Magnesium Glycinate 400mg  for headaches and for sleep. Take it 30 minutes to 1 hour before bed time.  Counseling Resource: [facility] [address]  [place], [place] [address]  Phone: [phone]  Fax: [phone]   A type of therapy known as CBT-I is considered the first-line treatment for insomnia. It may even be able to help 4/5 individuals get better sleep. At this time I am unaware of any institutions offer CBT-I within our area.  There are some online therapy use which can be both effective for sleep and cost effective as well.  Go! To Sleep- Web-based App-this is a Energy manager therapy platform created by [facility] clinic for patients with insomnia.  Last time checked, the cost was around $40 (pretty cheap for better sleep!).  CBT-I Coach- iOS/Android App- This app provides education about sleep and CBT-I, personalized feedback about your sleep, and an option for sleep diary to track wake and sleep times.  Sleepio-  iOS/Android App and Web-based App- A fairly popular option, may be worth a try!  [facility]- (text based)- this option doesn't provide with audio or with an app but Carmelia Roller has some helpful information for you!  If you have any problems before your next visit feel free to message me via MyChart (minor issues or questions) or call the office, otherwise you may reach out to schedule an office visit.  Thank you! Gerilyn Pilgrim Kailer Heindel, PA-C

## 2023-12-22 NOTE — Assessment & Plan Note (Signed)
 Diagnosed at age 33, treated with chemotherapy, and in remission for 10 years. No current residual effects from chemotherapy, but advised to monitor for potential long-term effects on lungs and bones- none that she knows of.

## 2024-03-22 ENCOUNTER — Encounter (HOSPITAL_BASED_OUTPATIENT_CLINIC_OR_DEPARTMENT_OTHER): Admitting: Student

## 2024-03-23 ENCOUNTER — Ambulatory Visit (HOSPITAL_BASED_OUTPATIENT_CLINIC_OR_DEPARTMENT_OTHER): Payer: Self-pay | Admitting: Student

## 2024-03-23 ENCOUNTER — Encounter (HOSPITAL_BASED_OUTPATIENT_CLINIC_OR_DEPARTMENT_OTHER): Payer: Self-pay | Admitting: Student

## 2024-03-23 VITALS — BP 129/85 | HR 94 | Temp 99.0°F | Resp 16 | Ht 69.0 in | Wt 319.9 lb

## 2024-03-23 DIAGNOSIS — Z23 Encounter for immunization: Secondary | ICD-10-CM

## 2024-03-23 DIAGNOSIS — Z131 Encounter for screening for diabetes mellitus: Secondary | ICD-10-CM

## 2024-03-23 DIAGNOSIS — Z0000 Encounter for general adult medical examination without abnormal findings: Secondary | ICD-10-CM | POA: Insufficient documentation

## 2024-03-23 DIAGNOSIS — E66813 Obesity, class 3: Secondary | ICD-10-CM

## 2024-03-23 DIAGNOSIS — Z13220 Encounter for screening for lipoid disorders: Secondary | ICD-10-CM

## 2024-03-23 DIAGNOSIS — Z136 Encounter for screening for cardiovascular disorders: Secondary | ICD-10-CM

## 2024-03-23 DIAGNOSIS — L304 Erythema intertrigo: Secondary | ICD-10-CM | POA: Insufficient documentation

## 2024-03-23 DIAGNOSIS — Z1329 Encounter for screening for other suspected endocrine disorder: Secondary | ICD-10-CM

## 2024-03-23 DIAGNOSIS — G43009 Migraine without aura, not intractable, without status migrainosus: Secondary | ICD-10-CM

## 2024-03-23 NOTE — Assessment & Plan Note (Signed)
 Diet improved with higher protein and lower carb intake. Exercise limited due to childcare responsibilities. Regular eye and dental check-ups maintained. - Order CBC, CMP, cholesterol panel, and A1c. - Check TSH for thyroid  function. - Administer TDAP vaccination. - Encourage continued healthy diet and gradual increase in physical activity as feasible.

## 2024-03-23 NOTE — Assessment & Plan Note (Signed)
 Significant reduction in migraine frequency since starting magnesium supplementation, with only two migraines in the last three months. Excedrin causes jitters, so Tylenol  is used for mild episodes. - Continue magnesium supplementation. - Use Tylenol  for mild migraines. - Avoid Excedrin unless necessary due to side effects.

## 2024-03-23 NOTE — Assessment & Plan Note (Signed)
 Recurrent intertrigo with yeast infection occurring monthly, coinciding with menstrual cycle. Symptoms include itching and occasional pustules under the breasts, belly, and thighs. Responds well to topical miconazole cream. Differential diagnosis included erythrasma, but Wood's lamp examination did not support this. Likely related to hormonal changes during menstruation increasing moisture and promoting fungal growth. Oral antifungals are avoided due to potential liver impact and preference for topical treatment. - Continue miconazole cream as needed during flare-ups. - Emphasize keeping affected areas dry, especially after showers. - Consider using powders for moisture control. - If symptoms worsen or do not resolve, consider Lotrimin spray or oral terbinafine .

## 2024-03-23 NOTE — Patient Instructions (Addendum)
 It was nice to see you today!  As we discussed in clinic:  If you ever want to discuss the counseling: Counseling Resource: [facility] [address]  [place], [place] [address]  Phone: [phone]  Fax: [phone]   If you have any problems before your next visit feel free to message me via MyChart (minor issues or questions) or call the office, otherwise you may reach out to schedule an office visit.  Thank you! Hope you have a good holiday weekend! Tuyen Uncapher, PA-C

## 2024-03-23 NOTE — Progress Notes (Signed)
 Complete physical exam  Patient: Miranda Villegas   DOB: 03/29/1991   33 y.o. Female  MRN: [id number]  Subjective:    Chief Complaint  Patient presents with   Medical Management of Chronic Issues    Follow up.   Rash    Gets very itchy when having periods under breasts, under belly, and thighs. Not currently having the rash.  OBGYN was trying to manage it but provider moved.   Discussed the use of AI scribe software for clinical note transcription with the patient, who gave verbal consent to proceed.  History of Present Illness   Miranda Villegas is a 33 year old female who presents with a recurrent monthly rash associated with her menstrual cycle.  She has been experiencing a recurrent rash since March, which appears in the skin folds under her breasts, belly, and thighs. The rash begins with intense itching on the first day of her menstrual cycle and typically resolves by the end of her period. Occasionally, pustules develop by the fifth day of her cycle. She has been using Monistat cream (miconazole) to manage the symptoms, which clears the rash within a few days. The rash occurs in different areas each month, affecting only one area at a time. She initially tried nystatin  powder and Monistat, with the latter being more effective. The rash does not appear outside of her menstrual cycle, and she has not been able to show it to a healthcare provider due to its transient nature.  Her past medical history includes frequent pregnancies, and she is currently seeking a new OB GYN after her previous provider left. She has not had a Pap smear recently but was previously seen every six months due to frequent pregnancies. She experiences situational anxiety, particularly in medical settings and when managing her children in public, but feels that magnesium has helped her manage these symptoms better.  Her diet has improved, focusing on higher protein and lower carbohydrates, although she indulges in chocolate  occasionally. She finds it challenging to exercise regularly due to caring for her three young children, including twins who will be two years old in October. She manages to go for walks once or twice a week. She has experienced a significant improvement in her migraines since starting magnesium supplements, with only two migraines in the last three months, which were manageable with Tylenol . She avoids Excedrin due to its caffeine content causing jitters.  No current pain, fatigue, or significant sleep disturbances. Reports improved sleep quality since starting magnesium supplements.       Most recent fall risk assessment:    12/05/2022    3:47 PM  Fall Risk   Falls in the past year? 0  Number falls in past yr: 0  Injury with Fall? 0  Risk for fall due to : No Fall Risks  Follow up Falls evaluation completed     Most recent depression screenings:    03/23/2024    3:08 PM 12/22/2023   10:33 AM  PHQ 2/9 Scores  PHQ - 2 Score 0 0  PHQ- 9 Score  0    Vision:Within last year and Dental: No current dental problems and Receives regular dental care  Patient Active Problem List   Diagnosis Date Noted   Intertrigo 03/23/2024   Adult general medical exam 03/23/2024   Situational anxiety 12/22/2023   Migraine without aura and without status migrainosus, not intractable 12/22/2023   Obesity, Class III, BMI 40-49.9 (morbid obesity) 12/05/2022   COVID-19 affecting pregnancy in  third trimester 09/28/2020   History of Hodgkin's lymphoma 08/12/2013   Past Medical History:  Diagnosis Date   Allergy    Adhesive   COVID-19    Fungal skin infection 09/01/2014   History of severe pre-eclampsia 08/13/2022   hodgkins lymphoma 08/12/2013   lymphoma recently dx   Mass in neck    right side    Social History   Tobacco Use   Smoking status: Never    Passive exposure: Never   Smokeless tobacco: Never  Vaping Use   Vaping status: Never Used  Substance Use Topics   Alcohol use: Never    Drug use: Never   Allergies  Allergen Reactions   Tape Rash, Other (See Comments) and Swelling    Pt has very sever intolerance to all adhesives., Swelling and blistering      Patient Care Team: Myldred Raju T, PA-C as PCP - General (Physician Assistant) Connell Davies, MD (Inactive) as PCP - OBGYN (Obstetrics and Gynecology) Jesus Oliphant, MD as Consulting Physician (Otolaryngology) Kerrin Elspeth BROCKS, MD as Consulting Physician (Cardiothoracic Surgery) Verdel Reymundo RAMAN, MD as Consulting Physician (Hematology and Oncology) Jannell Rodgers Lynwood Mickey., MD as Consulting Physician (Cardiothoracic Surgery)   Outpatient Medications Prior to Visit  Medication Sig   cholecalciferol (VITAMIN D) 1000 units tablet Take 2,000 Units by mouth daily.   MAGNESIUM Take 400 mg by mouth daily.   No facility-administered medications prior to visit.    ROS  Per HPI     Objective:     BP 129/85   Pulse 94   Temp 99 F (37.2 C) (Oral)   Resp 16   Ht 5' 9 (1.753 m)   Wt (!) 319 lb 14.4 oz (145.1 kg)   LMP 03/02/2024 (Approximate)   SpO2 98%   Breastfeeding No   BMI 47.24 kg/m  BP Readings from Last 3 Encounters:  03/23/24 129/85  12/22/23 125/85  10/24/23 129/82   Wt Readings from Last 3 Encounters:  03/23/24 (!) 319 lb 14.4 oz (145.1 kg)  12/22/23 (!) 310 lb 14.4 oz (141 kg)  10/24/23 (!) 306 lb 7 oz (139 kg)      Physical Exam Constitutional:      General: She is not in acute distress.    Appearance: Normal appearance. She is not ill-appearing or diaphoretic.  HENT:     Head: Normocephalic and atraumatic.     Right Ear: Tympanic membrane, ear canal and external ear normal.     Left Ear: Tympanic membrane, ear canal and external ear normal.     Nose: Nose normal.     Mouth/Throat:     Mouth: Mucous membranes are moist.     Pharynx: Oropharynx is clear.   Eyes:     General: No scleral icterus.       Right eye: No discharge.        Left eye: No discharge.      Extraocular Movements: Extraocular movements intact.     Conjunctiva/sclera: Conjunctivae normal.     Pupils: Pupils are equal, round, and reactive to light.   Neck:     Thyroid : No thyroid  mass, thyromegaly or thyroid  tenderness.     Vascular: No carotid bruit.   Cardiovascular:     Rate and Rhythm: Normal rate and regular rhythm.     Pulses: Normal pulses.     Heart sounds: Normal heart sounds. No murmur heard.    No friction rub. No gallop.  Pulmonary:     Effort: Pulmonary  effort is normal.     Breath sounds: Normal breath sounds. No wheezing, rhonchi or rales.  Chest:     Chest wall: No tenderness.  Abdominal:     General: Bowel sounds are normal. There is no distension.     Palpations: Abdomen is soft.     Tenderness: There is no abdominal tenderness. There is no guarding.   Musculoskeletal:        General: No swelling, deformity or signs of injury.     Cervical back: Neck supple.     Right lower leg: No edema.     Left lower leg: No edema.  Lymphadenopathy:     Cervical: No cervical adenopathy.     Right cervical: No superficial or posterior cervical adenopathy.    Left cervical: No superficial cervical adenopathy.   Skin:    Coloration: Skin is not jaundiced.     Findings: No rash.     Comments: No significant change to areas of concern under woods lamp exam   Neurological:     General: No focal deficit present.     Mental Status: She is alert and oriented to person, place, and time.     Motor: No weakness.   Psychiatric:        Behavior: Behavior normal.      No results found for any visits on 03/23/24.     Assessment & Plan:    Routine Health Maintenance and Physical Exam  Health Maintenance  Topic Date Due   COVID-19 Vaccine (1) Never done   Pneumococcal Vaccination (1 of 2 - PCV) 12/29/2011   DTaP/Tdap/Td vaccine (7 - Td or Tdap) 03/11/2019   HPV Vaccine (1 - Risk 3-dose SCDM series) Never done   Flu Shot  04/23/2024   Pap with HPV screening   06/27/2024   Hepatitis B Vaccine  Completed   Hepatitis C Screening  Completed   HIV Screening  Completed   Meningitis B Vaccine  Aged Out    Encouraged her to engage in regular exercise appropriate for her age and condition.  Adult general medical exam Assessment & Plan: Diet improved with higher protein and lower carb intake. Exercise limited due to childcare responsibilities. Regular eye and dental check-ups maintained. - Order CBC, CMP, cholesterol panel, and A1c. - Check TSH for thyroid  function. - Administer TDAP vaccination. - Encourage continued healthy diet and gradual increase in physical activity as feasible.   Orders: -     CBC with Differential/Platelet -     Comprehensive metabolic panel with GFR -     Lipid panel -     Hemoglobin A1c -     TSH  Encounter for lipid screening for cardiovascular disease -     Lipid panel  Screening for diabetes mellitus -     Hemoglobin A1c  Migraine without aura and without status migrainosus, not intractable Assessment & Plan: Significant reduction in migraine frequency since starting magnesium supplementation, with only two migraines in the last three months. Excedrin causes jitters, so Tylenol  is used for mild episodes. - Continue magnesium supplementation. - Use Tylenol  for mild migraines. - Avoid Excedrin unless necessary due to side effects.  Orders: -     TSH  Screening for thyroid  disorder -     TSH  Need for diphtheria-tetanus-pertussis (Tdap) vaccine  Intertrigo Assessment & Plan: Recurrent intertrigo with yeast infection occurring monthly, coinciding with menstrual cycle. Symptoms include itching and occasional pustules under the breasts, belly, and thighs. Responds well to topical  miconazole cream. Differential diagnosis included erythrasma, but Wood's lamp examination did not support this. Likely related to hormonal changes during menstruation increasing moisture and promoting fungal growth. Oral antifungals  are avoided due to potential liver impact and preference for topical treatment. - Continue miconazole cream as needed during flare-ups. - Emphasize keeping affected areas dry, especially after showers. - Consider using powders for moisture control. - If symptoms worsen or do not resolve, consider Lotrimin spray or oral terbinafine .   Obesity, Class III, BMI 40-49.9 (morbid obesity) Assessment & Plan: Diet improved with higher protein and lower carb intake. Exercise limited due to childcare responsibilities. Regular eye and dental check-ups maintained. - Order CBC, CMP, cholesterol panel, and A1c. - Check TSH for thyroid  function. - Administer TDAP vaccination. - Encourage continued healthy diet and gradual increase in physical activity as feasible.     Return in about 1 year (around 03/23/2025) for Annual Physical.   Lang DASEN Ravleen Ries, PA-C

## 2024-03-24 ENCOUNTER — Encounter (HOSPITAL_BASED_OUTPATIENT_CLINIC_OR_DEPARTMENT_OTHER): Admitting: Student

## 2024-03-24 ENCOUNTER — Other Ambulatory Visit (HOSPITAL_BASED_OUTPATIENT_CLINIC_OR_DEPARTMENT_OTHER): Payer: Self-pay | Admitting: Student

## 2024-03-24 ENCOUNTER — Ambulatory Visit (HOSPITAL_BASED_OUTPATIENT_CLINIC_OR_DEPARTMENT_OTHER): Payer: Self-pay | Admitting: Student

## 2024-03-24 DIAGNOSIS — E781 Pure hyperglyceridemia: Secondary | ICD-10-CM

## 2024-03-24 LAB — CBC WITH DIFFERENTIAL/PLATELET
Basophils Absolute: 0 10*3/uL (ref 0.0–0.2)
Basos: 1 %
EOS (ABSOLUTE): 0.1 10*3/uL (ref 0.0–0.4)
Eos: 1 %
Hematocrit: 38.9 % (ref 34.0–46.6)
Hemoglobin: 12.1 g/dL (ref 11.1–15.9)
Immature Grans (Abs): 0 10*3/uL (ref 0.0–0.1)
Immature Granulocytes: 0 %
Lymphocytes Absolute: 2.3 10*3/uL (ref 0.7–3.1)
Lymphs: 29 %
MCH: 25.2 pg — ABNORMAL LOW (ref 26.6–33.0)
MCHC: 31.1 g/dL — ABNORMAL LOW (ref 31.5–35.7)
MCV: 81 fL (ref 79–97)
Monocytes Absolute: 0.4 10*3/uL (ref 0.1–0.9)
Monocytes: 5 %
Neutrophils Absolute: 5.2 10*3/uL (ref 1.4–7.0)
Neutrophils: 63 %
Platelets: 367 10*3/uL (ref 150–450)
RBC: 4.81 x10E6/uL (ref 3.77–5.28)
RDW: 14.4 % (ref 11.7–15.4)
WBC: 8 10*3/uL (ref 3.4–10.8)

## 2024-03-24 LAB — COMPREHENSIVE METABOLIC PANEL WITH GFR
ALT: 19 IU/L (ref 0–32)
AST: 12 IU/L (ref 0–40)
Albumin: 4.5 g/dL (ref 3.9–4.9)
Alkaline Phosphatase: 116 IU/L (ref 44–121)
BUN/Creatinine Ratio: 18 (ref 9–23)
BUN: 14 mg/dL (ref 6–20)
Bilirubin Total: <0.2 mg/dL (ref 0.0–1.2)
CO2: 21 mmol/L (ref 20–29)
Calcium: 9.9 mg/dL (ref 8.7–10.2)
Chloride: 101 mmol/L (ref 96–106)
Creatinine, Ser: 0.78 mg/dL (ref 0.57–1.00)
Globulin, Total: 2.8 g/dL (ref 1.5–4.5)
Glucose: 92 mg/dL (ref 70–99)
Potassium: 4.6 mmol/L (ref 3.5–5.2)
Sodium: 138 mmol/L (ref 134–144)
Total Protein: 7.3 g/dL (ref 6.0–8.5)
eGFR: 104 mL/min/{1.73_m2} (ref >59)

## 2024-03-24 LAB — LIPID PANEL
Chol/HDL Ratio: 4.9 ratio — ABNORMAL HIGH (ref 0.0–4.4)
Cholesterol, Total: 204 mg/dL — ABNORMAL HIGH (ref 100–199)
HDL: 42 mg/dL (ref >39)
LDL Chol Calc (NIH): 96 mg/dL (ref 0–99)
Triglycerides: 396 mg/dL — ABNORMAL HIGH (ref 0–149)
VLDL Cholesterol Cal: 66 mg/dL — ABNORMAL HIGH (ref 5–40)

## 2024-03-24 LAB — TSH: TSH: 1.46 u[IU]/mL (ref 0.450–4.500)

## 2024-03-24 LAB — HEMOGLOBIN A1C
Est. average glucose Bld gHb Est-mCnc: 117 mg/dL
Hgb A1c MFr Bld: 5.7 % — ABNORMAL HIGH (ref 4.8–5.6)

## 2024-03-24 MED ORDER — FENOFIBRATE 145 MG PO TABS: 145.0000 mg | ORAL_TABLET | Freq: Every day | ORAL | 3 refills | Status: DC

## 2024-05-24 ENCOUNTER — Encounter (HOSPITAL_BASED_OUTPATIENT_CLINIC_OR_DEPARTMENT_OTHER): Payer: Self-pay | Admitting: Student

## 2024-06-02 ENCOUNTER — Telehealth (HOSPITAL_BASED_OUTPATIENT_CLINIC_OR_DEPARTMENT_OTHER): Payer: Self-pay

## 2024-06-02 ENCOUNTER — Encounter (HOSPITAL_BASED_OUTPATIENT_CLINIC_OR_DEPARTMENT_OTHER): Payer: Self-pay | Admitting: Student

## 2024-06-02 NOTE — Telephone Encounter (Signed)
 Pt advised and voices understanding.  Rescheduled appt.

## 2024-06-02 NOTE — Telephone Encounter (Signed)
 Copied from CRM [id number]. Topic: Appointments - Scheduling Inquiry for Clinic >> Jun 02, 2024 12:06 PM Charlet HERO wrote: Reason for CRM: Patient is wanting to know if appt for tomorrow can be her 38month follow up instead of on 10/01 as well as get labs. Please call the patient back.

## 2024-06-03 ENCOUNTER — Ambulatory Visit (HOSPITAL_BASED_OUTPATIENT_CLINIC_OR_DEPARTMENT_OTHER): Admitting: Student

## 2024-06-23 ENCOUNTER — Telehealth (HOSPITAL_BASED_OUTPATIENT_CLINIC_OR_DEPARTMENT_OTHER): Payer: Self-pay

## 2024-06-23 NOTE — Telephone Encounter (Signed)
 Pt advised to fast for appt TJ, CMA 06/23/2024  Copied from CRM #[id number]. Topic: Clinical - Request for Lab/Test Order >> Jun 23, 2024  9:39 AM Amy B wrote: Reason for CRM: Patient has an appointment scheduled for 10/08 and requests to have lab work done.  Please submit lab order and advise whether this is to be fasting.

## 2024-06-30 ENCOUNTER — Encounter (HOSPITAL_BASED_OUTPATIENT_CLINIC_OR_DEPARTMENT_OTHER): Payer: Self-pay | Admitting: Student

## 2024-06-30 ENCOUNTER — Ambulatory Visit (INDEPENDENT_AMBULATORY_CARE_PROVIDER_SITE_OTHER): Payer: Self-pay | Admitting: Student

## 2024-06-30 VITALS — BP 108/73 | HR 81 | Temp 98.5°F | Resp 16 | Ht 69.0 in | Wt 317.8 lb

## 2024-06-30 DIAGNOSIS — E66813 Obesity, class 3: Secondary | ICD-10-CM

## 2024-06-30 DIAGNOSIS — E781 Pure hyperglyceridemia: Secondary | ICD-10-CM

## 2024-06-30 DIAGNOSIS — R7303 Prediabetes: Secondary | ICD-10-CM

## 2024-06-30 DIAGNOSIS — G43009 Migraine without aura, not intractable, without status migrainosus: Secondary | ICD-10-CM

## 2024-06-30 NOTE — Progress Notes (Signed)
 Established Patient Office Visit  Subjective   Patient ID: Miranda Villegas, female    DOB: 1991/07/03  Age: 33 y.o. MRN: [id number]  Chief Complaint  Patient presents with   Medical Management of Chronic Issues    Follow up. Stopped cholesterol med due to severe sun burn.     HPI  Discussed the use of AI scribe software for clinical note transcription with the patient, who gave verbal consent to proceed.  History of Present Illness   Miranda Villegas is a 33 year old female who presents for follow-up on triglyceride management and rash symptoms.  She discontinued fenofibrate  due to sun sensitivity, which resolved after three days. She is currently taking over-the-counter omega-3 supplements. No new symptoms have occurred since stopping fenofibrate .  She has not experienced any migraines for the past five months and continues to take magnesium, which she finds beneficial.  Her A1c has increased slightly. She has made dietary changes, such as replacing rice with quinoa and using maple syrup or honey instead of white sugar, resulting in weight loss.  She experienced intermittent rashes over the past three months, with itchy spots appearing under her breasts, belly, and on both thighs. These rashes coincided with her menstrual cycle, appearing two days before and disappearing two days after her period.  There is a family history of possible rosacea, as her mother has mentioned it, though she has not observed it herself.      Patient Active Problem List   Diagnosis Date Noted   Hypertriglyceridemia 03/24/2024   Intertrigo 03/23/2024   Adult general medical exam 03/23/2024   Situational anxiety 12/22/2023   Migraine without aura and without status migrainosus, not intractable 12/22/2023   Obesity, Class III, BMI 40-49.9 (morbid obesity) (HCC) 12/05/2022   COVID-19 affecting pregnancy in third trimester 09/28/2020   History of Hodgkin's lymphoma 08/12/2013   Past Medical History:   Diagnosis Date   Allergy    Adhesive   COVID-19    Fungal skin infection 09/01/2014   History of severe pre-eclampsia 08/13/2022   hodgkins lymphoma 08/12/13   lymphoma recently dx   Mass in neck    right side    Social History   Tobacco Use   Smoking status: Never    Passive exposure: Never   Smokeless tobacco: Never  Vaping Use   Vaping status: Never Used  Substance Use Topics   Alcohol use: Never   Drug use: Never   Allergies  Allergen Reactions   Tape Rash, Other (See Comments) and Swelling    Pt has very sever intolerance to all adhesives., Swelling and blistering      ROS Per HPI.    Objective:     BP 108/73   Pulse 81   Temp 98.5 F (36.9 C) (Oral)   Resp 16   Ht 5' 9 (1.753 m)   Wt (!) 317 lb 12.8 oz (144.2 kg)   LMP 06/14/2024 (Exact Date)   SpO2 97%   Breastfeeding No   BMI 46.93 kg/m  BP Readings from Last 3 Encounters:  06/30/24 108/73  03/23/24 129/85  12/22/23 125/85   Wt Readings from Last 3 Encounters:  06/30/24 (!) 317 lb 12.8 oz (144.2 kg)  03/23/24 (!) 319 lb 14.4 oz (145.1 kg)  12/22/23 (!) 310 lb 14.4 oz (141 kg)      Physical Exam Constitutional:      General: She is not in acute distress.    Appearance: Normal appearance. She is not ill-appearing.  HENT:     Head: Normocephalic and atraumatic.     Nose: Nose normal.  Eyes:     General: No scleral icterus.    Conjunctiva/sclera: Conjunctivae normal.  Cardiovascular:     Rate and Rhythm: Normal rate and regular rhythm.     Heart sounds: Normal heart sounds. No murmur heard.    No friction rub.  Pulmonary:     Effort: Pulmonary effort is normal. No respiratory distress.     Breath sounds: Normal breath sounds. No wheezing, rhonchi or rales.  Musculoskeletal:        General: Normal range of motion.  Skin:    General: Skin is warm and dry.     Coloration: Skin is not jaundiced or pale.     Comments: Mild erythema of both cheeks, does not spare nasolabial fold.   Neurological:     General: No focal deficit present.     Mental Status: She is alert.  Psychiatric:        Mood and Affect: Mood normal.        Behavior: Behavior normal.      No results found for any visits on 06/30/24.  Last CBC Lab Results  Component Value Date   WBC 8.0 03/23/2024   HGB 12.1 03/23/2024   HCT 38.9 03/23/2024   MCV 81 03/23/2024   MCH 25.2 (L) 03/23/2024   RDW 14.4 03/23/2024   PLT 367 03/23/2024   Last metabolic panel Lab Results  Component Value Date   GLUCOSE 92 03/23/2024   NA 138 03/23/2024   K 4.6 03/23/2024   CL 101 03/23/2024   CO2 21 03/23/2024   BUN 14 03/23/2024   CREATININE 0.78 03/23/2024   EGFR 104 03/23/2024   CALCIUM 9.9 03/23/2024   PHOS 5.1 (H) 08/15/2013   PROT 7.3 03/23/2024   ALBUMIN 4.5 03/23/2024   LABGLOB 2.8 03/23/2024   AGRATIO 1.6 05/28/2022   BILITOT <0.2 03/23/2024   ALKPHOS 116 03/23/2024   AST 12 03/23/2024   ALT 19 03/23/2024   ANIONGAP 8 07/02/2022   Last lipids Lab Results  Component Value Date   CHOL 204 (H) 03/23/2024   HDL 42 03/23/2024   LDLCALC 96 03/23/2024   TRIG 396 (H) 03/23/2024   CHOLHDL 4.9 (H) 03/23/2024   Last hemoglobin A1c Lab Results  Component Value Date   HGBA1C 5.7 (H) 03/23/2024   Last thyroid  functions Lab Results  Component Value Date   TSH 1.460 03/23/2024      Assessment & Plan:   Assessment and Plan    Obesity, class 3 Weight loss observed since last visit with dietary changes implemented for weight management. - Continue current dietary modifications and aiming to improve weight overall.  Prediabetes A1c increased by 0.1, placing her in the prediabetic category at the lowest end. Weight loss and dietary changes have been implemented. - Monitor A1c levels. - Continue dietary modifications.  Pure hyperglyceridemia Discontinued fenofibrate  due to sun sensitivity. Currently managed with omega-3 supplements. No current issues reported. - Continue omega-3  supplements as recommended on the bottle. - Consider prescription omega-3 (Lovaza) if current regimen is ineffective. - Consider niacin if needed, with ibuprofen  to mitigate flushing.  Migraine Chronic, stable. No migraines reported in the past five months. Continues to take magnesium with good effect. - Continue magnesium supplementation.     I personally spent a total of 20 minutes in the care of the patient today including preparing to see the patient, getting/reviewing separately obtained history, performing  a medically appropriate exam/evaluation, counseling and educating, placing orders, documenting clinical information in the EHR, independently interpreting results, and aiding patient.  Return in about 9 months (around 03/24/2025) for Annual Physical.    Lular Letson T Kenise Barraco, PA-C

## 2024-06-30 NOTE — Patient Instructions (Addendum)
 It was nice to see you today!  As we discussed in clinic:  I will let you know how the cholesterol panel comes back and if we need to try a different prescription for it.  If you have any problems before your next visit feel free to message me via MyChart (minor issues or questions) or call the office, otherwise you may reach out to schedule an office visit.  Thank you! Shafer Swamy, PA-C

## 2024-07-01 ENCOUNTER — Ambulatory Visit (HOSPITAL_BASED_OUTPATIENT_CLINIC_OR_DEPARTMENT_OTHER): Payer: Self-pay | Admitting: Student

## 2024-07-01 LAB — LIPID PANEL
Chol/HDL Ratio: 4.4 ratio (ref 0.0–4.4)
Cholesterol, Total: 222 mg/dL — ABNORMAL HIGH (ref 100–199)
HDL: 51 mg/dL (ref >39)
LDL Chol Calc (NIH): 135 mg/dL — ABNORMAL HIGH (ref 0–99)
Triglycerides: 204 mg/dL — ABNORMAL HIGH (ref 0–149)
VLDL Cholesterol Cal: 36 mg/dL (ref 5–40)

## 2024-08-13 ENCOUNTER — Encounter (HOSPITAL_BASED_OUTPATIENT_CLINIC_OR_DEPARTMENT_OTHER): Payer: Self-pay | Admitting: Student

## 2024-08-16 ENCOUNTER — Other Ambulatory Visit (HOSPITAL_BASED_OUTPATIENT_CLINIC_OR_DEPARTMENT_OTHER): Payer: Self-pay | Admitting: Student

## 2024-08-16 DIAGNOSIS — L304 Erythema intertrigo: Secondary | ICD-10-CM

## 2024-08-16 MED ORDER — HYDROCORTISONE 2 % EX CREA: 1.0000 | TOPICAL_CREAM | Freq: Every day | CUTANEOUS | 1 refills | Status: DC

## 2024-08-25 ENCOUNTER — Other Ambulatory Visit (HOSPITAL_BASED_OUTPATIENT_CLINIC_OR_DEPARTMENT_OTHER): Payer: Self-pay

## 2024-08-25 DIAGNOSIS — L304 Erythema intertrigo: Secondary | ICD-10-CM

## 2024-08-25 MED ORDER — HYDROCORTISONE 2.5 % EX CREA: TOPICAL_CREAM | CUTANEOUS | 0 refills | Status: AC

## 2025-03-30 ENCOUNTER — Encounter (HOSPITAL_BASED_OUTPATIENT_CLINIC_OR_DEPARTMENT_OTHER): Admitting: Student

## 2025-04-06 ENCOUNTER — Encounter (HOSPITAL_BASED_OUTPATIENT_CLINIC_OR_DEPARTMENT_OTHER): Admitting: Student
